# Patient Record
Sex: Female | Born: 1970 | Race: White | Hispanic: No | Marital: Married | Smoking: Never smoker
Health system: Southern US, Community
[De-identification: ages and names within clinical notes are randomized; demographics above are authoritative.]

## PROBLEM LIST (undated history)

## (undated) DIAGNOSIS — K589 Irritable bowel syndrome without diarrhea: Secondary | ICD-10-CM

## (undated) DIAGNOSIS — N809 Endometriosis, unspecified: Secondary | ICD-10-CM

## (undated) DIAGNOSIS — Z8719 Personal history of other diseases of the digestive system: Secondary | ICD-10-CM

## (undated) DIAGNOSIS — M2550 Pain in unspecified joint: Secondary | ICD-10-CM

## (undated) DIAGNOSIS — I10 Essential (primary) hypertension: Secondary | ICD-10-CM

## (undated) DIAGNOSIS — E7800 Pure hypercholesterolemia, unspecified: Secondary | ICD-10-CM

## (undated) DIAGNOSIS — C50919 Malignant neoplasm of unspecified site of unspecified female breast: Secondary | ICD-10-CM

## (undated) DIAGNOSIS — E559 Vitamin D deficiency, unspecified: Secondary | ICD-10-CM

## (undated) DIAGNOSIS — F419 Anxiety disorder, unspecified: Secondary | ICD-10-CM

## (undated) DIAGNOSIS — F32A Depression, unspecified: Secondary | ICD-10-CM

## (undated) DIAGNOSIS — Z8041 Family history of malignant neoplasm of ovary: Secondary | ICD-10-CM

## (undated) DIAGNOSIS — M549 Dorsalgia, unspecified: Secondary | ICD-10-CM

## (undated) DIAGNOSIS — K829 Disease of gallbladder, unspecified: Secondary | ICD-10-CM

## (undated) DIAGNOSIS — K760 Fatty (change of) liver, not elsewhere classified: Secondary | ICD-10-CM

## (undated) DIAGNOSIS — Z923 Personal history of irradiation: Secondary | ICD-10-CM

## (undated) DIAGNOSIS — Z8051 Family history of malignant neoplasm of kidney: Secondary | ICD-10-CM

## (undated) DIAGNOSIS — N979 Female infertility, unspecified: Secondary | ICD-10-CM

## (undated) DIAGNOSIS — R600 Localized edema: Secondary | ICD-10-CM

## (undated) HISTORY — DX: Pure hypercholesterolemia, unspecified: E78.00

## (undated) HISTORY — DX: Family history of malignant neoplasm of kidney: Z80.51

## (undated) HISTORY — DX: Family history of malignant neoplasm of ovary: Z80.41

## (undated) HISTORY — DX: Endometriosis, unspecified: N80.9

## (undated) HISTORY — DX: Essential (primary) hypertension: I10

## (undated) HISTORY — DX: Personal history of other diseases of the digestive system: Z87.19

## (undated) HISTORY — DX: Depression, unspecified: F32.A

## (undated) HISTORY — DX: Vitamin D deficiency, unspecified: E55.9

## (undated) HISTORY — DX: Dorsalgia, unspecified: M54.9

## (undated) HISTORY — DX: Fatty (change of) liver, not elsewhere classified: K76.0

## (undated) HISTORY — DX: Irritable bowel syndrome, unspecified: K58.9

## (undated) HISTORY — DX: Localized edema: R60.0

## (undated) HISTORY — PX: BREAST LUMPECTOMY: SHX2

## (undated) HISTORY — DX: Pain in unspecified joint: M25.50

## (undated) HISTORY — PX: LAPAROSCOPY: SHX197

## (undated) HISTORY — DX: Anxiety disorder, unspecified: F41.9

## (undated) HISTORY — DX: Female infertility, unspecified: N97.9

## (undated) HISTORY — DX: Disease of gallbladder, unspecified: K82.9

---

## 1998-05-18 ENCOUNTER — Other Ambulatory Visit: Admission: RE | Admit: 1998-05-18 | Discharge: 1998-05-18 | Payer: Self-pay | Admitting: Obstetrics & Gynecology

## 1999-07-10 ENCOUNTER — Other Ambulatory Visit: Admission: RE | Admit: 1999-07-10 | Discharge: 1999-07-10 | Payer: Self-pay | Admitting: *Deleted

## 2000-03-31 ENCOUNTER — Encounter (INDEPENDENT_AMBULATORY_CARE_PROVIDER_SITE_OTHER): Payer: Self-pay | Admitting: Specialist

## 2000-03-31 ENCOUNTER — Other Ambulatory Visit: Admission: RE | Admit: 2000-03-31 | Discharge: 2000-03-31 | Payer: Self-pay | Admitting: Obstetrics and Gynecology

## 2000-04-16 ENCOUNTER — Encounter: Payer: Self-pay | Admitting: Obstetrics and Gynecology

## 2000-04-16 ENCOUNTER — Ambulatory Visit (HOSPITAL_COMMUNITY): Admission: RE | Admit: 2000-04-16 | Discharge: 2000-04-16 | Payer: Self-pay | Admitting: Obstetrics and Gynecology

## 2000-10-20 ENCOUNTER — Other Ambulatory Visit: Admission: RE | Admit: 2000-10-20 | Discharge: 2000-10-20 | Payer: Self-pay | Admitting: Obstetrics and Gynecology

## 2003-07-12 ENCOUNTER — Encounter: Payer: Self-pay | Admitting: Emergency Medicine

## 2003-07-12 ENCOUNTER — Emergency Department (HOSPITAL_COMMUNITY): Admission: EM | Admit: 2003-07-12 | Discharge: 2003-07-12 | Payer: Self-pay | Admitting: Emergency Medicine

## 2003-08-14 ENCOUNTER — Other Ambulatory Visit: Admission: RE | Admit: 2003-08-14 | Discharge: 2003-08-14 | Payer: Self-pay | Admitting: Gynecology

## 2004-03-13 ENCOUNTER — Inpatient Hospital Stay (HOSPITAL_COMMUNITY): Admission: AD | Admit: 2004-03-13 | Discharge: 2004-03-16 | Payer: Self-pay | Admitting: Gynecology

## 2004-05-02 ENCOUNTER — Other Ambulatory Visit: Admission: RE | Admit: 2004-05-02 | Discharge: 2004-05-02 | Payer: Self-pay | Admitting: Gynecology

## 2005-07-31 ENCOUNTER — Other Ambulatory Visit: Admission: RE | Admit: 2005-07-31 | Discharge: 2005-07-31 | Payer: Self-pay | Admitting: Gynecology

## 2005-11-14 ENCOUNTER — Other Ambulatory Visit: Admission: RE | Admit: 2005-11-14 | Discharge: 2005-11-14 | Payer: Self-pay | Admitting: Gynecology

## 2006-04-28 ENCOUNTER — Emergency Department (HOSPITAL_COMMUNITY): Admission: EM | Admit: 2006-04-28 | Discharge: 2006-04-28 | Payer: Self-pay | Admitting: Emergency Medicine

## 2006-06-06 ENCOUNTER — Inpatient Hospital Stay (HOSPITAL_COMMUNITY): Admission: AD | Admit: 2006-06-06 | Discharge: 2006-06-08 | Payer: Self-pay | Admitting: Gynecology

## 2006-07-21 ENCOUNTER — Other Ambulatory Visit: Admission: RE | Admit: 2006-07-21 | Discharge: 2006-07-21 | Payer: Self-pay | Admitting: Gynecology

## 2010-05-03 ENCOUNTER — Ambulatory Visit (HOSPITAL_COMMUNITY): Admission: RE | Admit: 2010-05-03 | Discharge: 2010-05-03 | Payer: Self-pay | Admitting: Obstetrics and Gynecology

## 2010-07-26 ENCOUNTER — Inpatient Hospital Stay (HOSPITAL_COMMUNITY): Admission: AD | Admit: 2010-07-26 | Discharge: 2010-07-26 | Payer: Self-pay | Admitting: Obstetrics and Gynecology

## 2010-10-02 ENCOUNTER — Inpatient Hospital Stay (HOSPITAL_COMMUNITY)
Admission: AD | Admit: 2010-10-02 | Discharge: 2010-10-02 | Payer: Self-pay | Source: Home / Self Care | Admitting: Obstetrics and Gynecology

## 2010-11-12 ENCOUNTER — Inpatient Hospital Stay (HOSPITAL_COMMUNITY): Admission: AD | Admit: 2010-11-12 | Discharge: 2010-11-12 | Payer: Self-pay | Admitting: Obstetrics and Gynecology

## 2010-12-03 ENCOUNTER — Inpatient Hospital Stay (HOSPITAL_COMMUNITY)
Admission: AD | Admit: 2010-12-03 | Discharge: 2010-12-04 | Payer: Self-pay | Source: Home / Self Care | Attending: Obstetrics and Gynecology | Admitting: Obstetrics and Gynecology

## 2011-01-20 ENCOUNTER — Encounter: Payer: Self-pay | Admitting: Podiatry

## 2011-03-10 LAB — COMPREHENSIVE METABOLIC PANEL
ALT: 20 U/L (ref 0–35)
AST: 27 U/L (ref 0–37)
Albumin: 2.7 g/dL — ABNORMAL LOW (ref 3.5–5.2)
Alkaline Phosphatase: 143 U/L — ABNORMAL HIGH (ref 39–117)
BUN: 10 mg/dL (ref 6–23)
CO2: 22 mEq/L (ref 19–32)
Calcium: 8.8 mg/dL (ref 8.4–10.5)
Chloride: 105 mEq/L (ref 96–112)
Creatinine, Ser: 0.49 mg/dL (ref 0.4–1.2)
GFR calc Af Amer: >60 mL/min (ref >60)
GFR calc non Af Amer: >60 mL/min (ref >60)
Glucose, Bld: 91 mg/dL (ref 70–99)
Potassium: 3.9 mEq/L (ref 3.5–5.1)
Sodium: 136 mEq/L (ref 135–145)
Total Bilirubin: 0.6 mg/dL (ref 0.3–1.2)
Total Protein: 5.8 g/dL — ABNORMAL LOW (ref 6.0–8.3)

## 2011-03-10 LAB — RH IMMUNE GLOB WKUP(>/=20WKS)(NOT WOMEN'S HOSP)
Fetal Screen: NEGATIVE
Unit division: 0

## 2011-03-10 LAB — CBC
HCT: 41.7 % (ref 36.0–46.0)
Hemoglobin: 12.4 g/dL (ref 12.0–15.0)
Hemoglobin: 14 g/dL (ref 12.0–15.0)
MCH: 31.9 pg (ref 26.0–34.0)
MCHC: 33.6 g/dL (ref 30.0–36.0)
MCV: 95 fL (ref 78.0–100.0)
Platelets: 175 10*3/uL (ref 150–400)
Platelets: 177 10*3/uL (ref 150–400)
RBC: 3.81 MIL/uL — ABNORMAL LOW (ref 3.87–5.11)
RBC: 4.39 MIL/uL (ref 3.87–5.11)
RDW: 13.3 % (ref 11.5–15.5)
WBC: 9.5 10*3/uL (ref 4.0–10.5)

## 2011-03-10 LAB — URINALYSIS, MICROSCOPIC ONLY
Bilirubin Urine: NEGATIVE
Glucose, UA: NEGATIVE mg/dL
Ketones, ur: 15 mg/dL — AB
pH: 6 (ref 5.0–8.0)

## 2011-03-11 LAB — TYPE AND SCREEN: Antibody Screen: POSITIVE

## 2011-03-11 LAB — KLEIHAUER-BETKE STAIN: Quantitation Fetal Hemoglobin: 0 mL

## 2011-03-12 LAB — RH IMMUNE GLOBULIN WORKUP (NOT WOMEN'S HOSP): Antibody Screen: NEGATIVE

## 2011-03-15 LAB — URINE CULTURE

## 2011-03-15 LAB — WET PREP, GENITAL
Trich, Wet Prep: NONE SEEN
Yeast Wet Prep HPF POC: NONE SEEN

## 2011-03-15 LAB — URINALYSIS, ROUTINE W REFLEX MICROSCOPIC
Bilirubin Urine: NEGATIVE
Glucose, UA: NEGATIVE mg/dL
Ketones, ur: NEGATIVE mg/dL
Leukocytes, UA: NEGATIVE
Protein, ur: NEGATIVE mg/dL
pH: 6 (ref 5.0–8.0)

## 2011-03-15 LAB — URINE MICROSCOPIC-ADD ON

## 2011-03-15 LAB — RH IMMUNE GLOBULIN WORKUP (NOT WOMEN'S HOSP)
ABO/RH(D): O NEG
Antibody Screen: NEGATIVE

## 2011-03-18 LAB — RH IMMUNE GLOBULIN WORKUP (NOT WOMEN'S HOSP): ABO/RH(D): O NEG

## 2011-05-16 NOTE — Discharge Summary (Signed)
Caitlin [name], Caitlin [name]                         ACCOUNT NO.:  [id number]   MEDICAL RECORD NO.:  [id number]                   PATIENT TYPE:  INP   LOCATION:  9142                                 FACILITY:  [facility]   PHYSICIAN:  Timothy P. Fontaine, M.D.           DATE OF BIRTH:  1971/09/24   DATE OF ADMISSION:  03/13/2004  DATE OF DISCHARGE:  03/16/2004                                 DISCHARGE SUMMARY   DISCHARGE DIAGNOSES:  1. Intrauterine pregnancy at 41 weeks delivered.  2. Rh negative.  3. Status post spontaneous vaginal delivery.   HISTORY:  This 32-years-of-age female gravida 1 para 0 whose prenatal course  had been complicated by this pregnancy being conceived with IVF.  It was  noted there had been at 19 weeks a prominent right renal pelvis and  prominent right lateral ventricle of the brain.  Sent to [facility] in consult and there was evidence everything except for the kidneys  all other anatomy reported to be normal with bilateral mild renal pelvic  dilatation.  The right measured 6.10 mm, the left measured 4.6 mm.  Follow-  up ultrasound at [facility] revealed the last ultrasound by the  perinatologist documented the right renal pelvis measured 1.3 cm with mild  pyelectasis; the left kidney normal.  Bladder and amniotic fluid were  normal.  No ureters were visualized.  No other abnormalities were  visualized.  There was appropriate growth.  She received RhoGAM in pregnancy  for being Rh negative.   HOSPITAL COURSE:  On March 13, 2004 the patient was admitted at 41 weeks for  induction, was given Cervidil, and Pitocin was begun on March 14, 2004.  The  patient subsequently underwent spontaneous vaginal delivery of a female,  Apgars of 9 and 9, weight of 7 pounds 3 ounces.  There was a second degree  midline episiotomy which was repaired.  There was hymenal laceration which  was repaired.  There were no complications.  Postpartum the patient remained  afebrile, voiding, in stable condition.  She was discharged to home on March 16, 2004 and given [facility] Gynecology postpartum instructions.   ACCESSORY CLINICAL FINDINGS/LABORATORY DATA:  The patient is O negative,  rubella immune.  On March 15, 2004 hemoglobin was 9.3.   DISPOSITION:  The patient was discharged to home, informed to return to the  office in 6 weeks, if had any problem prior to that time to be seen in the  office.  Received RhoGAM prior to discharge and given prescription for Tylox  #20 p.r.n. for pain.     Susa Loffler, P.A.                    Timothy P. Fontaine, M.D.    TSG/MEDQ  D:  04/08/2004  T:  04/08/2004  Job:  [id number]

## 2011-05-16 NOTE — H&P (Signed)
[name], [name]                         ACCOUNT NO.:  [id number]   MEDICAL RECORD NO.:  [id number]                   PATIENT TYPE:  INP   LOCATION:  9161                                 [facility]ILITY:  [facility]   PHYSICIAN:  Juan H. Lily Peer, M.D.             DATE OF BIRTH:  1971/11/09   DATE OF ADMISSION:  03/13/2004  DATE OF DISCHARGE:                                HISTORY & PHYSICAL   CHIEF COMPLAINT:  Postdates, 41 weeks estimated gestational age.   HISTORY:  The patient is a 40 year old gravida 1, para 0, who conceived this  pregnancy via IVF, and who is being admitted for induction due to the fact  she was seen in the office today and she is [redacted] weeks gestation.   Review of her prenatal care had demonstrated during the time of her  screening ultrasound it was noted at [redacted] weeks gestation there was prominent  right renal pelvis and prominent right lateral ventricle of the brain.  She  was sent to [facility] in consultation where she was been by  Caitlin [name].  At that time there was evidence that, except for the  kidneys, all level anatomy was reported to appear normal.  There was  bilateral mild renal pelvic dilatation; the right side measured 6.7 mm and  the left side measured 4.6 mm.  There was report of no caliceal involvement  in the bladder and amniotic fluid volume was normal.  During the study the  pelvises were seen to fluctuate in size and the lateral ventricle of the  brain measured 8 mm, which appeared prominent, but was still within the  normal range and there had been appropriate growth.  This probably  represented mild utero reflux or partial UPJ obstruction, so serial  ultrasound was recommended  and the patient had also been offered genetic  amniocentesis if in the event of a soft marker for trisomy-21, but had  declined.  She was seen for two additional visits  at [facility] with  the last ultrasound evaluation by the perinatologist; and, had  documented  that the right renal pelvis measured 1.3 cm with mild caliectasis; the left  kidney was normal with the pelvis measuring 5 mm.  The bladder and amniotic  fluid volume appeared normal and no ureters were visualized, and no other  fetal abnormalities were identified.  There was excellent appropriate  growth.  Estimated fetal weight at that time was in the 59th percentile and  it was recommended for postnatal evaluation.   The remainder of her pregnancy consists of receiving RhoGAM due to the fact  that she is O negative.  She did receive progesterone suppository for luteal  support during the first trimester after concession via IVF.  The last  ultrasound in the office on March 04th demonstrated the fetus in the 60th  percentile for 39 weeks whereby the baby was weighing 3522 grams, prominent  right renal pelvis, but within normal limits otherwise.  The amniotic fluid  was in the 70th percentile.   The patient was seen in the office today and her cervix was closed, long and  posterior; and, is being admitted for induction.   ALLERGIES:  The patient denies any allergies.   PAST MEDICAL HISTORY:  The patient has PUBS during this pregnancy, which was  treated with Benadryl and Calamine lotion.  She has history of laparoscopy.  She has had irritable bowel syndrome.   REVIEW OF SYSTEMS:  See [employer] form.   PHYSICAL EXAMINATION:  GENERAL APPEARANCE:  Well-developed, well-nourished  female.  VITAL SIGNS:  Blood pressure 130/80.  Urine with trace protein, negative  glucose on dipstick on the office visit on March 16th.  Weight 214 pounds.  HEENT:  Unremarkable.  NECK:  Neck is supple.  Trachea midline.  No carotid bruits.  No  thyromegaly.  LUNGS:  Lungs are clear to auscultation without rhonchi or wheezes.  HEART:  Regular rate and rhythm.  No murmurs.  No gallops.  BREASTS:  Breast exam not done.  ABDOMEN:  Gravid uterus.  Fundal height 39 cm.  Vertex presentation by   Leopold's maneuver.  PELVIC EXAMINATION:  Cervix closed, posterior and long.  EXTREMITIES:  DTRs 1+.  One plus pitting edema.  Negative clonus.   PRENATAL LABORATORY DATA:  Blood type O negative.  Negative antibody screen.  VDRL was nonreactive. Rubella immune.  Hepatitis B surface antigen and HIV  were negative.  ____________ alpha fetoprotein was normal.  Diabetes  screening was normal.  Pap smear was normal.  GBS culture was negative.   ASSESSMENT:  [age]-year-old gravida 1, para 0 at 38 weeks estimated  gestational age is being admitted this evening to [facility] for  cervical ripening with Cervidil followed by Pitocin augmentation the  following morning.  The risks, benefits, and the pros and cons were  discussed with the patient.  Group B Streptococcus culture was negative.   I anticipate vaginal delivery.  Patient is fully aware there is a  possibility that this could require serial inductions.   PLAN:  As per assessment above.                                               Juan H. Lily Peer, M.D.    JHF/MEDQ  D:  03/13/2004  T:  03/14/2004  Job:  [id number]

## 2013-02-16 ENCOUNTER — Ambulatory Visit: Payer: Self-pay | Admitting: Obstetrics and Gynecology

## 2013-03-23 ENCOUNTER — Other Ambulatory Visit: Payer: Self-pay | Admitting: Obstetrics and Gynecology

## 2013-03-24 LAB — PAP IG W/ RFLX HPV ASCU

## 2014-03-28 ENCOUNTER — Other Ambulatory Visit: Payer: Self-pay | Admitting: Obstetrics and Gynecology

## 2014-03-28 DIAGNOSIS — Z1231 Encounter for screening mammogram for malignant neoplasm of breast: Secondary | ICD-10-CM

## 2014-04-13 ENCOUNTER — Ambulatory Visit
Admission: RE | Admit: 2014-04-13 | Discharge: 2014-04-13 | Disposition: A | Discharge status: Home / Self Care | Payer: BC Managed Care – PPO | Source: Ambulatory Visit | Attending: Obstetrics and Gynecology | Admitting: Obstetrics and Gynecology

## 2014-04-13 DIAGNOSIS — Z1231 Encounter for screening mammogram for malignant neoplasm of breast: Secondary | ICD-10-CM

## 2014-05-30 ENCOUNTER — Ambulatory Visit: Admission: RE | Admit: 2014-05-30 | Payer: BC Managed Care – PPO | Source: Ambulatory Visit

## 2014-05-30 ENCOUNTER — Other Ambulatory Visit: Payer: Self-pay | Admitting: Obstetrics and Gynecology

## 2014-05-30 ENCOUNTER — Encounter (INDEPENDENT_AMBULATORY_CARE_PROVIDER_SITE_OTHER): Payer: Self-pay

## 2014-05-30 ENCOUNTER — Ambulatory Visit
Admission: RE | Admit: 2014-05-30 | Discharge: 2014-05-30 | Disposition: A | Discharge status: Home / Self Care | Payer: BC Managed Care – PPO | Source: Ambulatory Visit | Attending: Obstetrics and Gynecology | Admitting: Obstetrics and Gynecology

## 2014-05-30 DIAGNOSIS — Z1231 Encounter for screening mammogram for malignant neoplasm of breast: Secondary | ICD-10-CM

## 2016-05-06 DIAGNOSIS — Z6837 Body mass index (BMI) 37.0-37.9, adult: Secondary | ICD-10-CM | POA: Diagnosis not present

## 2016-05-06 DIAGNOSIS — N390 Urinary tract infection, site not specified: Secondary | ICD-10-CM | POA: Diagnosis not present

## 2016-05-07 DIAGNOSIS — T7840XA Allergy, unspecified, initial encounter: Secondary | ICD-10-CM | POA: Diagnosis not present

## 2016-05-09 DIAGNOSIS — R319 Hematuria, unspecified: Secondary | ICD-10-CM | POA: Diagnosis not present

## 2016-05-09 DIAGNOSIS — N390 Urinary tract infection, site not specified: Secondary | ICD-10-CM | POA: Diagnosis not present

## 2016-05-19 DIAGNOSIS — Z1231 Encounter for screening mammogram for malignant neoplasm of breast: Secondary | ICD-10-CM | POA: Diagnosis not present

## 2016-05-19 DIAGNOSIS — F329 Major depressive disorder, single episode, unspecified: Secondary | ICD-10-CM | POA: Diagnosis not present

## 2016-05-19 DIAGNOSIS — Z6836 Body mass index (BMI) 36.0-36.9, adult: Secondary | ICD-10-CM | POA: Diagnosis not present

## 2016-05-19 DIAGNOSIS — Z01419 Encounter for gynecological examination (general) (routine) without abnormal findings: Secondary | ICD-10-CM | POA: Diagnosis not present

## 2016-05-19 DIAGNOSIS — Z112 Encounter for screening for other bacterial diseases: Secondary | ICD-10-CM | POA: Diagnosis not present

## 2016-05-19 DIAGNOSIS — R102 Pelvic and perineal pain: Secondary | ICD-10-CM | POA: Diagnosis not present

## 2016-06-18 DIAGNOSIS — R3121 Asymptomatic microscopic hematuria: Secondary | ICD-10-CM | POA: Diagnosis not present

## 2016-06-18 DIAGNOSIS — R102 Pelvic and perineal pain: Secondary | ICD-10-CM | POA: Diagnosis not present

## 2016-08-06 ENCOUNTER — Encounter: Payer: Self-pay | Admitting: Pediatrics

## 2016-08-07 DIAGNOSIS — R635 Abnormal weight gain: Secondary | ICD-10-CM | POA: Diagnosis not present

## 2016-08-07 DIAGNOSIS — Z0000 Encounter for general adult medical examination without abnormal findings: Secondary | ICD-10-CM | POA: Diagnosis not present

## 2016-08-07 DIAGNOSIS — F419 Anxiety disorder, unspecified: Secondary | ICD-10-CM | POA: Diagnosis not present

## 2016-08-18 DIAGNOSIS — R3121 Asymptomatic microscopic hematuria: Secondary | ICD-10-CM | POA: Diagnosis not present

## 2016-09-11 DIAGNOSIS — H04123 Dry eye syndrome of bilateral lacrimal glands: Secondary | ICD-10-CM | POA: Diagnosis not present

## 2016-09-11 DIAGNOSIS — R51 Headache: Secondary | ICD-10-CM | POA: Diagnosis not present

## 2016-10-14 DIAGNOSIS — R3121 Asymptomatic microscopic hematuria: Secondary | ICD-10-CM | POA: Diagnosis not present

## 2016-12-29 HISTORY — PX: CHOLECYSTECTOMY: SHX55

## 2017-01-29 DIAGNOSIS — D2262 Melanocytic nevi of left upper limb, including shoulder: Secondary | ICD-10-CM | POA: Diagnosis not present

## 2017-01-29 DIAGNOSIS — D2261 Melanocytic nevi of right upper limb, including shoulder: Secondary | ICD-10-CM | POA: Diagnosis not present

## 2017-01-29 DIAGNOSIS — D2212 Melanocytic nevi of left eyelid, including canthus: Secondary | ICD-10-CM | POA: Diagnosis not present

## 2017-01-29 DIAGNOSIS — D220 Melanocytic nevi of lip: Secondary | ICD-10-CM | POA: Diagnosis not present

## 2017-02-10 DIAGNOSIS — Z23 Encounter for immunization: Secondary | ICD-10-CM | POA: Diagnosis not present

## 2017-04-05 DIAGNOSIS — R1011 Right upper quadrant pain: Secondary | ICD-10-CM | POA: Diagnosis not present

## 2017-04-06 ENCOUNTER — Other Ambulatory Visit: Payer: Self-pay | Admitting: Family Medicine

## 2017-04-06 DIAGNOSIS — R1011 Right upper quadrant pain: Secondary | ICD-10-CM

## 2017-04-13 ENCOUNTER — Ambulatory Visit
Admission: RE | Admit: 2017-04-13 | Discharge: 2017-04-13 | Disposition: A | Discharge status: Home / Self Care | Payer: BLUE CROSS/BLUE SHIELD | Source: Ambulatory Visit | Attending: Family Medicine | Admitting: Family Medicine

## 2017-04-13 DIAGNOSIS — K8020 Calculus of gallbladder without cholecystitis without obstruction: Secondary | ICD-10-CM | POA: Diagnosis not present

## 2017-04-13 DIAGNOSIS — R1011 Right upper quadrant pain: Secondary | ICD-10-CM

## 2017-04-29 DIAGNOSIS — K8010 Calculus of gallbladder with chronic cholecystitis without obstruction: Secondary | ICD-10-CM | POA: Diagnosis not present

## 2017-05-21 DIAGNOSIS — Z1231 Encounter for screening mammogram for malignant neoplasm of breast: Secondary | ICD-10-CM | POA: Diagnosis not present

## 2017-05-21 DIAGNOSIS — Z6836 Body mass index (BMI) 36.0-36.9, adult: Secondary | ICD-10-CM | POA: Diagnosis not present

## 2017-05-21 DIAGNOSIS — Z01419 Encounter for gynecological examination (general) (routine) without abnormal findings: Secondary | ICD-10-CM | POA: Diagnosis not present

## 2017-05-21 DIAGNOSIS — Z124 Encounter for screening for malignant neoplasm of cervix: Secondary | ICD-10-CM | POA: Diagnosis not present

## 2017-06-01 DIAGNOSIS — K8010 Calculus of gallbladder with chronic cholecystitis without obstruction: Secondary | ICD-10-CM | POA: Diagnosis not present

## 2017-06-02 DIAGNOSIS — K824 Cholesterolosis of gallbladder: Secondary | ICD-10-CM | POA: Diagnosis not present

## 2017-06-02 DIAGNOSIS — K8010 Calculus of gallbladder with chronic cholecystitis without obstruction: Secondary | ICD-10-CM | POA: Diagnosis not present

## 2017-09-25 DIAGNOSIS — L570 Actinic keratosis: Secondary | ICD-10-CM | POA: Diagnosis not present

## 2017-10-09 DIAGNOSIS — D1803 Hemangioma of intra-abdominal structures: Secondary | ICD-10-CM | POA: Diagnosis not present

## 2017-10-09 DIAGNOSIS — Z23 Encounter for immunization: Secondary | ICD-10-CM | POA: Diagnosis not present

## 2017-10-09 DIAGNOSIS — Z0000 Encounter for general adult medical examination without abnormal findings: Secondary | ICD-10-CM | POA: Diagnosis not present

## 2017-10-09 DIAGNOSIS — Z131 Encounter for screening for diabetes mellitus: Secondary | ICD-10-CM | POA: Diagnosis not present

## 2017-10-09 DIAGNOSIS — Z13220 Encounter for screening for lipoid disorders: Secondary | ICD-10-CM | POA: Diagnosis not present

## 2017-10-13 ENCOUNTER — Other Ambulatory Visit: Payer: Self-pay | Admitting: Family Medicine

## 2017-10-13 DIAGNOSIS — D1803 Hemangioma of intra-abdominal structures: Secondary | ICD-10-CM

## 2017-10-26 ENCOUNTER — Ambulatory Visit
Admission: RE | Admit: 2017-10-26 | Discharge: 2017-10-26 | Disposition: A | Discharge status: Home / Self Care | Payer: BLUE CROSS/BLUE SHIELD | Source: Ambulatory Visit | Attending: Family Medicine | Admitting: Family Medicine

## 2017-10-26 DIAGNOSIS — K7689 Other specified diseases of liver: Secondary | ICD-10-CM | POA: Diagnosis not present

## 2017-10-26 DIAGNOSIS — D1803 Hemangioma of intra-abdominal structures: Secondary | ICD-10-CM

## 2017-10-26 MED ORDER — GADOBENATE DIMEGLUMINE 529 MG/ML IV SOLN
17.0000 mL | Freq: Once | INTRAVENOUS | Status: AC | PRN
  Administered 2017-10-26: 17 mL via INTRAVENOUS

## 2018-01-14 DIAGNOSIS — M21622 Bunionette of left foot: Secondary | ICD-10-CM | POA: Diagnosis not present

## 2018-01-14 DIAGNOSIS — M79671 Pain in right foot: Secondary | ICD-10-CM | POA: Diagnosis not present

## 2018-01-14 DIAGNOSIS — B079 Viral wart, unspecified: Secondary | ICD-10-CM | POA: Diagnosis not present

## 2018-01-14 DIAGNOSIS — M79672 Pain in left foot: Secondary | ICD-10-CM | POA: Diagnosis not present

## 2018-01-14 DIAGNOSIS — M21621 Bunionette of right foot: Secondary | ICD-10-CM | POA: Diagnosis not present

## 2018-01-28 DIAGNOSIS — B079 Viral wart, unspecified: Secondary | ICD-10-CM | POA: Diagnosis not present

## 2018-01-28 DIAGNOSIS — M79671 Pain in right foot: Secondary | ICD-10-CM | POA: Diagnosis not present

## 2018-01-28 DIAGNOSIS — M79672 Pain in left foot: Secondary | ICD-10-CM | POA: Diagnosis not present

## 2018-02-10 DIAGNOSIS — M79672 Pain in left foot: Secondary | ICD-10-CM | POA: Diagnosis not present

## 2018-02-10 DIAGNOSIS — M79671 Pain in right foot: Secondary | ICD-10-CM | POA: Diagnosis not present

## 2018-02-10 DIAGNOSIS — B079 Viral wart, unspecified: Secondary | ICD-10-CM | POA: Diagnosis not present

## 2018-03-04 DIAGNOSIS — M79671 Pain in right foot: Secondary | ICD-10-CM | POA: Diagnosis not present

## 2018-03-04 DIAGNOSIS — B079 Viral wart, unspecified: Secondary | ICD-10-CM | POA: Diagnosis not present

## 2018-03-04 DIAGNOSIS — M79672 Pain in left foot: Secondary | ICD-10-CM | POA: Diagnosis not present

## 2018-03-12 DIAGNOSIS — R1010 Upper abdominal pain, unspecified: Secondary | ICD-10-CM | POA: Diagnosis not present

## 2018-03-17 DIAGNOSIS — D2239 Melanocytic nevi of other parts of face: Secondary | ICD-10-CM | POA: Diagnosis not present

## 2018-03-17 DIAGNOSIS — D2271 Melanocytic nevi of right lower limb, including hip: Secondary | ICD-10-CM | POA: Diagnosis not present

## 2018-03-17 DIAGNOSIS — D2262 Melanocytic nevi of left upper limb, including shoulder: Secondary | ICD-10-CM | POA: Diagnosis not present

## 2018-03-17 DIAGNOSIS — D2261 Melanocytic nevi of right upper limb, including shoulder: Secondary | ICD-10-CM | POA: Diagnosis not present

## 2018-03-17 DIAGNOSIS — D225 Melanocytic nevi of trunk: Secondary | ICD-10-CM | POA: Diagnosis not present

## 2018-09-08 DIAGNOSIS — Z01419 Encounter for gynecological examination (general) (routine) without abnormal findings: Secondary | ICD-10-CM | POA: Diagnosis not present

## 2018-09-08 DIAGNOSIS — Z3040 Encounter for surveillance of contraceptives, unspecified: Secondary | ICD-10-CM | POA: Diagnosis not present

## 2018-09-08 DIAGNOSIS — Z6836 Body mass index (BMI) 36.0-36.9, adult: Secondary | ICD-10-CM | POA: Diagnosis not present

## 2018-09-08 DIAGNOSIS — Z1231 Encounter for screening mammogram for malignant neoplasm of breast: Secondary | ICD-10-CM | POA: Diagnosis not present

## 2018-09-25 DIAGNOSIS — Z23 Encounter for immunization: Secondary | ICD-10-CM | POA: Diagnosis not present

## 2018-10-21 DIAGNOSIS — H04123 Dry eye syndrome of bilateral lacrimal glands: Secondary | ICD-10-CM | POA: Diagnosis not present

## 2018-10-21 DIAGNOSIS — H1045 Other chronic allergic conjunctivitis: Secondary | ICD-10-CM | POA: Diagnosis not present

## 2018-10-26 DIAGNOSIS — Z131 Encounter for screening for diabetes mellitus: Secondary | ICD-10-CM | POA: Diagnosis not present

## 2018-10-26 DIAGNOSIS — Z13220 Encounter for screening for lipoid disorders: Secondary | ICD-10-CM | POA: Diagnosis not present

## 2018-10-26 DIAGNOSIS — Z0000 Encounter for general adult medical examination without abnormal findings: Secondary | ICD-10-CM | POA: Diagnosis not present

## 2018-10-26 DIAGNOSIS — K148 Other diseases of tongue: Secondary | ICD-10-CM | POA: Diagnosis not present

## 2018-11-09 DIAGNOSIS — K141 Geographic tongue: Secondary | ICD-10-CM | POA: Diagnosis not present

## 2018-11-09 DIAGNOSIS — F411 Generalized anxiety disorder: Secondary | ICD-10-CM | POA: Diagnosis not present

## 2018-11-09 DIAGNOSIS — E049 Nontoxic goiter, unspecified: Secondary | ICD-10-CM | POA: Diagnosis not present

## 2018-11-09 DIAGNOSIS — R1011 Right upper quadrant pain: Secondary | ICD-10-CM | POA: Diagnosis not present

## 2018-11-09 DIAGNOSIS — F39 Unspecified mood [affective] disorder: Secondary | ICD-10-CM | POA: Diagnosis not present

## 2018-11-09 DIAGNOSIS — F4320 Adjustment disorder, unspecified: Secondary | ICD-10-CM | POA: Diagnosis not present

## 2018-11-23 DIAGNOSIS — E049 Nontoxic goiter, unspecified: Secondary | ICD-10-CM | POA: Diagnosis not present

## 2018-11-23 DIAGNOSIS — F39 Unspecified mood [affective] disorder: Secondary | ICD-10-CM | POA: Diagnosis not present

## 2018-11-23 DIAGNOSIS — F411 Generalized anxiety disorder: Secondary | ICD-10-CM | POA: Diagnosis not present

## 2018-11-23 DIAGNOSIS — K141 Geographic tongue: Secondary | ICD-10-CM | POA: Diagnosis not present

## 2020-05-14 ENCOUNTER — Encounter: Payer: Self-pay | Admitting: Neurology

## 2020-07-31 NOTE — Progress Notes (Signed)
NEUROLOGY CONSULTATION NOTE  Caitlin [name] MRN: [id number] DOB: December 03, 1971  Referring provider: Horald Pollen, MD Primary care provider: Lorene Dy, MD  Reason for consult:  Migraines, pulsatile tinnitus  HISTORY OF PRESENT ILLNESS: Caitlin [name] is a 49 year old right-handed female who presents for migraines and pulsatile tinnitus.  History supplemented by referring provider's note.  Visual aura wavy pattern and then blind spont.    She has longstanding history of migraine presenting with visual aura of wavy lines followed by blind spot preceding headache.  Several years ago, she was started on Mirena and developed bilateral pulsatile tinnitus.  She began having new headaches in the Fall of 2020.  They are severe right occipital shooting pain associated with nausea, photophobia, and phonophobia but not associated with speech disturbance, dizziness, numbness or weakness.  She treats with ibuprofen.  In April 2021, she had a cough that triggered a particularly severe occipital headache.  It was aggravated by change in position, either standing up or bending over.  The severe headache subsided but she had a persistent dull right occipital pressure that lasted 2 weeks.  Other than her typical headaches, she has not had a recurrence of that occipital headache since then.  CT of head on 04/26/2020 was unremarkable.  She had an MRI of brain and internal auditory canals on 06/05/2020 which was normal.  She saw her ophthalmologist, Dr. 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  Visual fields were full.  Most recent exam on 07/05/2020 was stable.  She denies visual obscurations.  TSH from 05/12/2020 was 0.60.  05/12/2020 LABS:  CMP with Na 137, K 4.8, Cl 101, CO2 24, glucose 103, BUN 25, Cr 0.82, Ca 9.6, 6 bili 0.3, ALP 77, AST 22, ALT 31   PAST MEDICAL HISTORY: Past Medical History:  Diagnosis Date  . Elevated cholesterol   . Endometriosis   . History of IBS   .  Infertility, female     PAST SURGICAL HISTORY: Past Surgical History:  Procedure Laterality Date  . LAPAROSCOPY      MEDICATIONS: No current outpatient medications on file prior to visit.   No current facility-administered medications on file prior to visit.    ALLERGIES: No Known Allergies  FAMILY HISTORY: Family History  Problem Relation Age of Onset  . Hypertension Father   . Diabetes Maternal Aunt   . Cancer Paternal Grandmother        ovarian    SOCIAL HISTORY: Social History   Socioeconomic History  . Marital status: Married    Spouse name: Not on file  . Number of children: Not on file  . Years of education: Not on file  . Highest education level: Not on file  Occupational History  . Not on file  Tobacco Use  . Smoking status: Never Smoker  . Smokeless tobacco: Never Used  Substance and Sexual Activity  . Alcohol use: No  . Drug use: No  . Sexual activity: Yes  Other Topics Concern  . Not on file  Social History Narrative  . Not on file   Social Determinants of Health   Financial Resource Strain:   . Difficulty of Paying Living Expenses:   Food Insecurity:   . Worried About Charity fundraiser in the Last Year:   . Arboriculturist in the Last Year:   Transportation Needs:   . Film/video editor (Medical):   Marland Kitchen Lack of Transportation (Non-Medical):   Physical Activity:   .  Days of Exercise per Week:   . Minutes of Exercise per Session:   Stress:   . Feeling of Stress :   Social Connections:   . Frequency of Communication with Friends and Family:   . Frequency of Social Gatherings with Friends and Family:   . Attends Religious Services:   . Active Member of Clubs or Organizations:   . Attends Archivist Meetings:   Marland Kitchen Marital Status:   Intimate Partner Violence:   . Fear of Current or Ex-Partner:   . Emotionally Abused:   Marland Kitchen Physically Abused:   . Sexually Abused:     PHYSICAL EXAM: Blood pressure 128/80, pulse 79, height  5\' 5"  (1.651 m), weight 199 lb (90.3 kg), SpO2 99 %. General: No acute distress.  Patient appears well-groomed.  Head:  Normocephalic/atraumatic Eyes:  fundi examined but not visualized Neck: supple, no paraspinal tenderness, full range of motion Back: No paraspinal tenderness Heart: regular rate and rhythm Lungs: Clear to auscultation bilaterally. Vascular: No carotid bruits. Neurological Exam: Mental status: alert and oriented to person, place, and time, recent and remote memory intact, fund of knowledge intact, attention and concentration intact, speech fluent and not dysarthric, language intact. Cranial nerves: CN I: not tested CN II: pupils equal, round and reactive to light, visual fields intact CN III, IV, VI:  full range of motion, no nystagmus, no ptosis CN V: facial sensation intact CN VII: upper and lower face symmetric CN VIII: hearing intact CN IX, X: gag intact, uvula midline CN XI: sternocleidomastoid and trapezius muscles intact CN XII: tongue midline Bulk & Tone: normal, no fasciculations. Motor:  5/5 throughout  Sensation:  Pinprick and vibration sensation intact. Deep Tendon Reflexes:  2+ throughout, toes downgoing.  Finger to nose testing:  Without dysmetria.  Heel to shin:  Without dysmetria.  Gait:  Normal station and stride.  Able to turn and tandem walk. Romberg negative.  IMPRESSION: 1.  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  Bilateral pulsatile tinnitus.   Concern for idiopathic intracranial hypertension.  She would like to avoid starting a carbonic anhydrase inhibitor such as acetazolamide if possible.  As she is asymptomatic (other than for pulsatile tinnitus which doesn't bother her), and eye exam shows full visual fields and only trace possible papilledema, I think this is reasonable.   PLAN: 1.  MRV of head to evaluate for venus sinus thrombosis. 2.  MRA of head and neck to evaluate for alternative causes of pulsatile tinnitus. 3.  Schedule for lumbar  puncture to record opening CSF pressure and assess CSF cell count, culture, protein and glucose. 4.  She will follow up with Dr. Katy Fitch as scheduled. 5.  She will follow up with me in 6 months.  Thank you for allowing me to take part in the care of this patient.  Metta Clines, DO  CC:  Horald Pollen, MD

## 2020-08-01 ENCOUNTER — Ambulatory Visit (INDEPENDENT_AMBULATORY_CARE_PROVIDER_SITE_OTHER): Payer: BC Managed Care – PPO | Admitting: Neurology

## 2020-08-01 ENCOUNTER — Other Ambulatory Visit: Payer: Self-pay

## 2020-08-01 ENCOUNTER — Encounter: Payer: Self-pay | Admitting: Neurology

## 2020-08-01 VITALS — BP 128/80 | HR 79 | Ht 65.0 in | Wt 199.0 lb

## 2020-08-01 DIAGNOSIS — H4710 Unspecified papilledema: Secondary | ICD-10-CM

## 2020-08-01 DIAGNOSIS — H93A3 Pulsatile tinnitus, bilateral: Secondary | ICD-10-CM

## 2020-08-01 DIAGNOSIS — G4489 Other headache syndrome: Secondary | ICD-10-CM

## 2020-08-01 NOTE — Patient Instructions (Addendum)
1.  Check MRV of head and MRA of head and neck. We have sent a referral to [facility] for your MRA and MRV and they will call you directly to schedule your appointment. They are located at [place]. If you need to contact them directly please call [phone].  2.  Schedule for lumbar puncture (spinal tap) to check CSF opening pressure and check CSF cell count, culture, protein and glucose. 3.  Further recommendations pending results 4.  Follow up with Dr. Katy Fitch as scheduled 5.  Follow up with me in 6 months.

## 2020-08-29 ENCOUNTER — Ambulatory Visit
Admission: RE | Admit: 2020-08-29 | Discharge: 2020-08-29 | Disposition: A | Discharge status: Home / Self Care | Payer: BC Managed Care – PPO | Source: Ambulatory Visit | Attending: Neurology | Admitting: Neurology

## 2020-08-29 DIAGNOSIS — H4710 Unspecified papilledema: Secondary | ICD-10-CM

## 2020-08-29 DIAGNOSIS — H93A3 Pulsatile tinnitus, bilateral: Secondary | ICD-10-CM

## 2020-08-29 MED ORDER — GADOBENATE DIMEGLUMINE 529 MG/ML IV SOLN
18.0000 mL | Freq: Once | INTRAVENOUS | Status: AC | PRN
  Administered 2020-08-29: 18 mL via INTRAVENOUS

## 2020-08-31 ENCOUNTER — Other Ambulatory Visit: Payer: Self-pay

## 2020-08-31 ENCOUNTER — Ambulatory Visit
Admission: RE | Admit: 2020-08-31 | Discharge: 2020-08-31 | Disposition: A | Discharge status: Home / Self Care | Payer: BC Managed Care – PPO | Source: Ambulatory Visit | Attending: Neurology | Admitting: Neurology

## 2020-08-31 DIAGNOSIS — H93A3 Pulsatile tinnitus, bilateral: Secondary | ICD-10-CM

## 2020-08-31 DIAGNOSIS — H4710 Unspecified papilledema: Secondary | ICD-10-CM

## 2020-08-31 DIAGNOSIS — G4489 Other headache syndrome: Secondary | ICD-10-CM

## 2020-09-04 ENCOUNTER — Telehealth: Payer: Self-pay

## 2020-09-04 NOTE — Telephone Encounter (Signed)
PT advised of her MRI results

## 2020-09-04 NOTE — Telephone Encounter (Signed)
-----   Message from Pieter Partridge, DO sent at 09/04/2020  7:19 AM EDT ----- Reviewed all the MRIs of the blood vessels in the head and neck.  Nothing concerning such as aneurysm or blood clot. Proceed with lumbar puncture.

## 2020-09-06 ENCOUNTER — Other Ambulatory Visit: Payer: Self-pay

## 2020-09-06 ENCOUNTER — Ambulatory Visit
Admission: RE | Admit: 2020-09-06 | Discharge: 2020-09-06 | Disposition: A | Discharge status: Home / Self Care | Payer: BC Managed Care – PPO | Source: Ambulatory Visit | Attending: Neurology | Admitting: Neurology

## 2020-09-06 VITALS — BP 128/74 | HR 86

## 2020-09-06 DIAGNOSIS — H4710 Unspecified papilledema: Secondary | ICD-10-CM

## 2020-09-06 DIAGNOSIS — H93A3 Pulsatile tinnitus, bilateral: Secondary | ICD-10-CM

## 2020-09-06 NOTE — Progress Notes (Signed)
Blood obtained from [facility] for LP labs by [employer] (phlebotomist). Pt tolerated procedure well. Site is unremarkable.

## 2020-09-06 NOTE — Discharge Instructions (Signed)

## 2020-09-10 ENCOUNTER — Other Ambulatory Visit: Payer: Self-pay

## 2020-09-10 DIAGNOSIS — G932 Benign intracranial hypertension: Secondary | ICD-10-CM | POA: Insufficient documentation

## 2020-09-10 LAB — CNS IGG SYNTHESIS RATE, CSF+BLOOD
Albumin Serum: 4 g/dL (ref 3.5–5.2)
Albumin, CSF: 16.4 mg/dL (ref 8.0–42.0)
CNS-IgG Synthesis Rate: -5.6 mg/24 h (ref <=3.3)
IgG (Immunoglobin G), Serum: 1350 mg/dL (ref 600–1640)
IgG Total CSF: 2.4 mg/dL (ref 0.8–7.7)
IgG-Index: 0.43 (ref <0.66)

## 2020-09-10 LAB — MYELIN BASIC PROTEIN, CSF: Myelin Basic Protein: <2 mcg/L (ref 2.0–4.0)

## 2020-09-10 LAB — CSF CULTURE W GRAM STAIN
MICRO NUMBER:: 10929410
Result:: NO GROWTH
SPECIMEN QUALITY:: ADEQUATE

## 2020-09-10 LAB — CSF CELL COUNT WITH DIFFERENTIAL
RBC Count, CSF: 3 cells/uL — ABNORMAL HIGH
WBC, CSF: 3 cells/uL (ref 0–5)

## 2020-09-10 LAB — PROTEIN, CSF: Total Protein, CSF: 28 mg/dL (ref 15–45)

## 2020-09-10 LAB — GLUCOSE, CSF: Glucose, CSF: 56 mg/dL (ref 40–80)

## 2020-09-10 MED ORDER — TOPIRAMATE 25 MG PO TABS: 25.0000 mg | ORAL_TABLET | Freq: Every day | ORAL | 1 refills | Status: DC

## 2020-11-19 ENCOUNTER — Other Ambulatory Visit: Payer: Self-pay | Admitting: Neurology

## 2021-01-31 NOTE — Progress Notes (Signed)
NEUROLOGY FOLLOW UP OFFICE NOTE  Caitlin [name] [id number]   Subjective:  Caitlin [name] is a 50 year old right-handed female who follows up for pulsatile tinnitus and migraines.  UPDATE: She underwent workup for pulsatile tinnitus and idiopathic intracranial hypertension. MRA of head and neck on 08/29/2020 personally reviewed was normal. MRV of head on 08/31/2020 personally reviewed showed narrowed distal transverse sinus bilaterally and partially empty sella but no thrombosis. She underwent LP on 09/06/2020 which demonstrated opening pressure of 23 cm water and closing pressure of 17 cm water.   She was subsequently started on topiramate.  Current NSAIDs/analgesics:  ibuprofen Current Anticonvulsant medications:  topiramate 50mg   She has only had one dull headache since starting the topiramate, when it was snowing, and lasted a couple of hours and didn't require any medication.  Pulsating sensation has decreased significantly.  She may hear it once in a while.  If she stands up quickly or bends over, she may get the head pressure briefly.  She saw Dr. Katy Fitch again and showed minimal improvement in optic nerve but overall stable   HISTORY: She has longstanding history of migraine presenting with visual aura of wavy lines followed by blind spot preceding headache.  Several years ago, she was started on Mirena and developed bilateral pulsatile tinnitus.  She began having new headaches in the Fall of 2020.  They are severe right occipital shooting pain associated with nausea, photophobia, and phonophobia but not associated with speech disturbance, dizziness, numbness or weakness.  She treats with ibuprofen.  In April 2021, she had a cough that triggered a particularly severe occipital headache.  It was aggravated by change in position, either standing up or bending over.  The severe headache subsided but she had a persistent dull right occipital pressure that lasted 2 weeks.  Other than her  typical headaches, she has not had a recurrence of that occipital headache since then.  CT of head on 04/26/2020 was unremarkable.  She had an MRI of brain and internal auditory canals on 06/05/2020 which was normal.  She saw her ophthalmologist, Dr. 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  Visual fields were full.  Most recent exam on 07/05/2020 was stable.  She denies visual obscurations.  TSH from 05/12/2020 was 0.60.  PAST MEDICAL HISTORY: Past Medical History:  Diagnosis Date  . Elevated cholesterol   . Endometriosis   . Endometriosis   . History of IBS   . Infertility, female     MEDICATIONS: Current Outpatient Medications on File Prior to Visit  Medication Sig Dispense Refill  . cholecalciferol (VITAMIN D3) 25 MCG (1000 UNIT) tablet Take 1,000 Units by mouth daily. Take Once a Day    . topiramate (TOPAMAX) 50 MG tablet Take 1 tablet (50 mg total) by mouth at bedtime. 30 tablet 3   No current facility-administered medications on file prior to visit.    ALLERGIES: Allergies  Allergen Reactions  . Ciprofloxacin Other (See Comments)    "achy joints" "achy joints"   . Other Rash    SULFA DRUGS    FAMILY HISTORY: Family History  Problem Relation Age of Onset  . Hypertension Father   . Diabetes Maternal Aunt   . Cancer Paternal Grandmother        ovarian    SOCIAL HISTORY: Social History   Socioeconomic History  . Marital status: Married    Spouse name: Not on file  . Number of children: 3  . Years  of education: Not on file  . Highest education level: Not on file  Occupational History  . Not on file  Tobacco Use  . Smoking status: Never Smoker  . Smokeless tobacco: Never Used  Vaping Use  . Vaping Use: Never used  Substance and Sexual Activity  . Alcohol use: No  . Drug use: No  . Sexual activity: Yes  Other Topics Concern  . Not on file  Social History Narrative   Right Handed   Two Story Home   Drinks Caffeine Occasionally     Social Determinants of Health   Financial Resource Strain: Not on file  Food Insecurity: Not on file  Transportation Needs: Not on file  Physical Activity: Not on file  Stress: Not on file  Social Connections: Not on file  Intimate Partner Violence: Not on file     Objective:  Blood pressure 129/82, pulse 78, height 5\' 5"  (1.651 m), weight 212 lb (96.2 kg), SpO2 99 %. General: No acute distress.  Patient appears well-groomed.   Head:  Normocephalic/atraumatic Eyes:  Fundi examined but not visualized Neck: supple, no paraspinal tenderness, full range of motion Heart:  Regular rate and rhythm Lungs:  Clear to auscultation bilaterally Back: No paraspinal tenderness Neurological Exam: alert and oriented to person, place, and time. Attention span and concentration intact, recent and remote memory intact, fund of knowledge intact.  Speech fluent and not dysarthric, language intact.  CN II-XII intact. Bulk and tone normal, muscle strength 5/5 throughout.  Sensation to light touch, temperature and vibration intact.  Deep tendon reflexes 2+ throughout, toes downgoing.  Finger to nose and heel to shin testing intact.  Gait normal, Romberg negative.   Assessment/Plan:   1.  History of migraine with aura presenting with occipital headaches, pulsatile tinnitus and questionable papilledema.  Opening pressure on LP only borderline elevated at 23 cm water but given associated symptoms and MRI findings, consider idiopathic intracranial hypertension.  1.  Topiramate 50mg  at bedtime - refilled today 2.  Follow up with Dr. Katy Fitch for routine eye exam 3.  Limit use of pain relievers to no more than 2 days out of week to prevent risk of rebound or medication-overuse headache. 4.  Keep headache diary 5.  Follow up in 6 months.  Metta Clines, DO  CC:  Horald Pollen, MD

## 2021-02-01 ENCOUNTER — Other Ambulatory Visit: Payer: Self-pay

## 2021-02-01 ENCOUNTER — Ambulatory Visit (INDEPENDENT_AMBULATORY_CARE_PROVIDER_SITE_OTHER): Payer: BC Managed Care – PPO | Admitting: Neurology

## 2021-02-01 ENCOUNTER — Encounter: Payer: Self-pay | Admitting: Neurology

## 2021-02-01 VITALS — BP 129/82 | HR 78 | Ht 65.0 in | Wt 212.0 lb

## 2021-02-01 DIAGNOSIS — G4489 Other headache syndrome: Secondary | ICD-10-CM | POA: Diagnosis not present

## 2021-02-01 DIAGNOSIS — G932 Benign intracranial hypertension: Secondary | ICD-10-CM | POA: Diagnosis not present

## 2021-02-01 MED ORDER — TOPIRAMATE 50 MG PO TABS
50.0000 mg | ORAL_TABLET | Freq: Every day | ORAL | 5 refills | Status: DC
Start: 2021-02-01 — End: 2021-03-14

## 2021-02-01 NOTE — Patient Instructions (Signed)
Refilled topiramate 50mg  at bedtime Limit use of pain relievers to no more than 2 days out of week to prevent risk of rebound or medication-overuse headache. Keep headache diary Follow up with Dr. Katy Fitch for routine recheck Follow up with me in 6 months

## 2021-02-04 ENCOUNTER — Encounter (INDEPENDENT_AMBULATORY_CARE_PROVIDER_SITE_OTHER): Payer: Self-pay | Admitting: Bariatrics

## 2021-02-04 ENCOUNTER — Other Ambulatory Visit: Payer: Self-pay

## 2021-02-04 ENCOUNTER — Ambulatory Visit (INDEPENDENT_AMBULATORY_CARE_PROVIDER_SITE_OTHER): Payer: BC Managed Care – PPO | Admitting: Bariatrics

## 2021-02-04 VITALS — BP 128/85 | HR 100 | Temp 98.4°F | Ht 65.0 in | Wt 208.0 lb

## 2021-02-04 DIAGNOSIS — E7800 Pure hypercholesterolemia, unspecified: Secondary | ICD-10-CM

## 2021-02-04 DIAGNOSIS — Z0289 Encounter for other administrative examinations: Secondary | ICD-10-CM

## 2021-02-04 DIAGNOSIS — R5383 Other fatigue: Secondary | ICD-10-CM

## 2021-02-04 DIAGNOSIS — G932 Benign intracranial hypertension: Secondary | ICD-10-CM | POA: Diagnosis not present

## 2021-02-04 DIAGNOSIS — R0602 Shortness of breath: Secondary | ICD-10-CM | POA: Diagnosis not present

## 2021-02-04 DIAGNOSIS — E669 Obesity, unspecified: Secondary | ICD-10-CM

## 2021-02-04 DIAGNOSIS — E559 Vitamin D deficiency, unspecified: Secondary | ICD-10-CM | POA: Diagnosis not present

## 2021-02-04 DIAGNOSIS — Z1331 Encounter for screening for depression: Secondary | ICD-10-CM

## 2021-02-04 DIAGNOSIS — R7309 Other abnormal glucose: Secondary | ICD-10-CM

## 2021-02-04 DIAGNOSIS — F5089 Other specified eating disorder: Secondary | ICD-10-CM

## 2021-02-04 DIAGNOSIS — Z9189 Other specified personal risk factors, not elsewhere classified: Secondary | ICD-10-CM

## 2021-02-04 DIAGNOSIS — Z6834 Body mass index (BMI) 34.0-34.9, adult: Secondary | ICD-10-CM

## 2021-02-04 DIAGNOSIS — E6609 Other obesity due to excess calories: Secondary | ICD-10-CM

## 2021-02-05 LAB — HEMOGLOBIN A1C
Est. average glucose Bld gHb Est-mCnc: 111 mg/dL
Hgb A1c MFr Bld: 5.5 % (ref 4.8–5.6)

## 2021-02-05 LAB — LIPID PANEL WITH LDL/HDL RATIO
Cholesterol, Total: 232 mg/dL — ABNORMAL HIGH (ref 100–199)
HDL: 51 mg/dL (ref >39)
LDL Chol Calc (NIH): 162 mg/dL — ABNORMAL HIGH (ref 0–99)
LDL/HDL Ratio: 3.2 ratio (ref 0.0–3.2)
Triglycerides: 105 mg/dL (ref 0–149)
VLDL Cholesterol Cal: 19 mg/dL (ref 5–40)

## 2021-02-05 LAB — VITAMIN D 25 HYDROXY (VIT D DEFICIENCY, FRACTURES): Vit D, 25-Hydroxy: 24.3 ng/mL — ABNORMAL LOW (ref 30.0–100.0)

## 2021-02-05 LAB — INSULIN, RANDOM: INSULIN: 9.1 u[IU]/mL (ref 2.6–24.9)

## 2021-02-06 NOTE — Progress Notes (Signed)
Dear Dr. Katharine Look [name],   Thank you for referring Caitlin [name] to our clinic. The following note includes my evaluation and treatment recommendations.  Chief Complaint:   OBESITY Caitlin [name] (MR# [id number]) is a 50 y.o. female who presents for evaluation and treatment of obesity and related comorbidities. Current BMI is Body mass index is 34.61 kg/m. Caitlin [name] has been struggling with her weight for many years and has been unsuccessful in either losing weight, maintaining weight loss, or reaching her healthy weight goal.  Caitlin [name] is currently in the action stage of change and ready to dedicate time achieving and maintaining a healthier weight. Caitlin [name] is interested in becoming our patient and working on intensive lifestyle modifications including (but not limited to) diet and exercise for weight loss.  Caitlin [name] states that she does like to cook but struggles with finding meals that the whole family will eat.  Caitlin [name]'s habits were reviewed today and are as follows: Her family eats meals together, she thinks her family will eat healthier with her, her desired weight loss is 58 lbs, she has been heavy most of her life, she started gaining weight after her third child and taking Zoloft, her heaviest weight ever was 230 pounds, she has significant food cravings issues, she is frequently drinking liquids with calories, she frequently makes poor food choices, she frequently eats larger portions than normal, she has binge eating behaviors and she struggles with emotional eating.  Depression Screen Caitlin [name]'s Food and Mood (modified PHQ-9) score was 20.  Depression screen Caitlin [name] 2/9 02/04/2021  Decreased Interest 3  Down, Depressed, Hopeless 3  PHQ - 2 Score 6  Altered sleeping 2  Tired, decreased energy 3  Change in appetite 3  Feeling bad or failure about yourself  3  Trouble concentrating 1  Moving slowly or fidgety/restless 2  Suicidal thoughts 0  PHQ-9 Score 20  Difficult  doing work/chores Somewhat difficult   Subjective:   1. Other fatigue Caitlin [name] admits to daytime somnolence and admits to waking up still tired. Caitlin [name] has a history of symptoms of daytime fatigue. Caitlin [name] generally gets 7 hours of sleep per night, and states that she has difficulty falling back asleep if awakened. Snoring is not present. Apneic episodes are not present. Epworth Sleepiness Score is 12.  2. SOB (shortness of breath) on exertion Caitlin [name] notes increasing shortness of breath with exercising and seems to be worsening over time with weight gain. She notes getting out of breath sooner with activity than she used to. This has gotten worse recently. Caitlin [name] denies shortness of breath at rest or orthopnea.  3. Idiopathic intracranial hypertension Caitlin [name] is taking Topamax. Slightly elevated opening pressure for lumbar puncture.  BP Readings from Last 3 Encounters:  02/04/21 128/85  02/01/21 129/82  09/06/20 128/74    4. Vitamin D deficiency Caitlin [name] is taking OTC Vit D 1,000 units.  5. Elevated glucose Caitlin [name] has a history of some elevated blood glucose readings without a diagnosis of diabetes.  6. Elevated cholesterol Caitlin [name] has hyperlipidemia and has been trying to improve her cholesterol levels with intensive lifestyle modification including a low saturated fat diet, exercise and weight loss. She denies any chest pain, claudication or myalgias. She is not on statin therapy.  7. Other disorder of eating Caitlin [name]'s PHQ-9 score is 20. Caitlin [name] is struggling with emotional eating and using food for comfort to the extent that it is negatively impacting her health. She has been working on behavior modification techniques to help reduce  her emotional eating. She shows no sign of suicidal or homicidal ideations.  8. At risk for activity intolerance Caitlin [name] is at risk for exercise intolerance due to obesity and the weather.   Assessment/Plan:   1. Other fatigue Caitlin [name]  does feel that her weight is causing her energy to be lower than it should be. Fatigue may be related to obesity, depression or many other causes. Labs will be ordered, and in the meanwhile, Caitlin [name] will focus on self care including making healthy food choices, increasing physical activity and focusing on stress reduction.  - EKG 12-Lead - Hemoglobin A1c - Insulin, random - Lipid Panel With LDL/HDL Ratio - VITAMIN D 25 Hydroxy (Vit-D Deficiency, Fractures)  2. SOB (shortness of breath) on exertion Caitlin [name] does feel that she gets out of breath more easily that she used to when she exercises. Caitlin [name]'s shortness of breath appears to be obesity related and exercise induced. She has agreed to work on weight loss and gradually increase exercise to treat her exercise induced shortness of breath. Will continue to monitor closely.  3. Idiopathic intracranial hypertension Follow up with neurologist.  4. Vitamin D deficiency Low Vitamin D level contributes to fatigue and are associated with obesity, breast, and colon cancer. She agrees to continue to take Vitamin D @1 ,000 IU daily and will follow-up for routine testing of Vitamin D, at least 2-3 times per year to avoid over-replacement.  - VITAMIN D 25 Hydroxy (Vit-D Deficiency, Fractures)  5. Elevated glucose Fasting labs will be obtained and results with be discussed with Caitlin [name] in 2 weeks at her follow up visit. In the meanwhile Caitlin [name] was started on a lower simple carbohydrate diet and will work on weight loss efforts.  - Hemoglobin A1c - Insulin, random  6. Elevated cholesterol Cardiovascular risk and specific lipid/LDL goals reviewed.  We discussed several lifestyle modifications today and Caitlin [name] will continue to work on diet, exercise and weight loss efforts. Orders and follow up as documented in patient record.   Counseling Intensive lifestyle modifications are the first line treatment for this issue. . Dietary changes: Increase  soluble fiber. Decrease simple carbohydrates. . Exercise changes: Moderate to vigorous-intensity aerobic activity 150 minutes per week if tolerated. . Lipid-lowering medications: see documented in medical record.  - Lipid Panel With LDL/HDL Ratio  7. Other disorder of eating Behavior modification techniques were discussed today to help Caitlin [name] deal with her emotional/non-hunger eating behaviors.  Orders and follow up as documented in patient record. CBT techniques discussed to help with emotional eating.  8. Depression screening Caitlin [name] had a positive depression screening. Depression is commonly associated with obesity and often results in emotional eating behaviors. We will monitor this closely and work on CBT to help improve the non-hunger eating patterns. Referral to Psychology may be required if no improvement is seen as she continues in our clinic.  9. At risk for activity intolerance Caitlin [name] was given approximately 15 minutes of exercise intolerance counseling today. She is 50 y.o. female and has risk factors exercise intolerance including obesity. We discussed intensive lifestyle modifications today with an emphasis on specific weight loss instructions and strategies. Caitlin [name] will slowly increase activity as tolerated.  Repetitive spaced learning was employed today to elicit superior memory formation and behavioral change.  10. Class 1 obesity with serious comorbidity and body mass index (BMI) of 34.0 to 34.9 in adult, unspecified obesity type Shontay is currently in the action stage of change and her goal is to continue with weight loss efforts. I  recommend Aliegha begin the structured treatment plan as follows:  Sutton will focus on meal plan and intentional eating. Labs reviewed.  She has agreed to the Category 1 Plan.  Exercise goals: All adults should avoid inactivity. Some physical activity is better than none, and adults who participate in any amount of physical activity  gain some health benefits.   Behavioral modification strategies: increasing lean protein intake, decreasing simple carbohydrates, increasing vegetables, increasing water intake, decreasing eating out, no skipping meals, meal planning and cooking strategies, keeping healthy foods in the home and planning for success.  She was informed of the importance of frequent follow-up visits to maximize her success with intensive lifestyle modifications for her multiple health conditions. She was informed we would discuss her lab results at her next visit unless there is a critical issue that needs to be addressed sooner. Lisa agreed to keep her next visit at the agreed upon time to discuss these results.  Objective:   Blood pressure 128/85, pulse 100, temperature 98.4 F (36.9 C), height 5\' 5"  (1.651 m), weight 208 lb (94.3 kg), last menstrual period 02/03/2021, SpO2 96 %. Body mass index is 34.61 kg/m.  EKG: Normal sinus rhythm, rate 85.  Indirect Calorimeter completed today shows a VO2 of 111 and a REE of 771.  Her calculated basal metabolic rate is 9675 thus her basal metabolic rate is worse than expected.  General: Cooperative, alert, well developed, in no acute distress. HEENT: Conjunctivae and lids unremarkable. Cardiovascular: Regular rhythm.  Lungs: Normal work of breathing. Neurologic: No focal deficits.   Lab Results  Component Value Date   CREATININE 0.49 12/03/2010   BUN 10 12/03/2010   NA 136 12/03/2010   K 3.9 12/03/2010   CL 105 12/03/2010   CO2 22 12/03/2010   Lab Results  Component Value Date   ALT 20 12/03/2010   AST 27 12/03/2010   ALKPHOS 143 (H) 12/03/2010   BILITOT 0.6 12/03/2010   Lab Results  Component Value Date   HGBA1C 5.5 02/04/2021   Lab Results  Component Value Date   INSULIN 9.1 02/04/2021   No results found for: TSH Lab Results  Component Value Date   CHOL 232 (H) 02/04/2021   HDL 51 02/04/2021   LDLCALC 162 (H) 02/04/2021   TRIG 105  02/04/2021   Lab Results  Component Value Date   WBC 9.3 12/04/2010   HGB 12.4 12/04/2010   HCT 36.4 12/04/2010   MCV 95.5 12/04/2010   PLT 177 12/04/2010    Attestation Statements:   Reviewed by clinician on day of visit: allergies, medications, problem list, medical history, surgical history, family history, social history, and previous encounter notes.  Coral Ceo, am acting as Location manager for [employer], DO.  I have reviewed the above documentation for accuracy and completeness, and I agree with the above. Jearld Lesch, DO

## 2021-02-07 ENCOUNTER — Encounter (INDEPENDENT_AMBULATORY_CARE_PROVIDER_SITE_OTHER): Payer: Self-pay | Admitting: Bariatrics

## 2021-02-07 DIAGNOSIS — Z6834 Body mass index (BMI) 34.0-34.9, adult: Secondary | ICD-10-CM | POA: Insufficient documentation

## 2021-02-07 DIAGNOSIS — K589 Irritable bowel syndrome without diarrhea: Secondary | ICD-10-CM | POA: Insufficient documentation

## 2021-02-18 ENCOUNTER — Other Ambulatory Visit: Payer: Self-pay

## 2021-02-18 ENCOUNTER — Ambulatory Visit (INDEPENDENT_AMBULATORY_CARE_PROVIDER_SITE_OTHER): Payer: BC Managed Care – PPO | Admitting: Bariatrics

## 2021-02-18 ENCOUNTER — Encounter (INDEPENDENT_AMBULATORY_CARE_PROVIDER_SITE_OTHER): Payer: Self-pay | Admitting: Bariatrics

## 2021-02-18 VITALS — BP 125/82 | HR 81 | Temp 98.1°F | Ht 65.0 in | Wt 205.0 lb

## 2021-02-18 DIAGNOSIS — E6609 Other obesity due to excess calories: Secondary | ICD-10-CM

## 2021-02-18 DIAGNOSIS — E7800 Pure hypercholesterolemia, unspecified: Secondary | ICD-10-CM | POA: Diagnosis not present

## 2021-02-18 DIAGNOSIS — Z6834 Body mass index (BMI) 34.0-34.9, adult: Secondary | ICD-10-CM | POA: Diagnosis not present

## 2021-02-18 DIAGNOSIS — E559 Vitamin D deficiency, unspecified: Secondary | ICD-10-CM | POA: Insufficient documentation

## 2021-02-18 DIAGNOSIS — Z9189 Other specified personal risk factors, not elsewhere classified: Secondary | ICD-10-CM | POA: Diagnosis not present

## 2021-02-18 MED ORDER — VITAMIN D (ERGOCALCIFEROL) 1.25 MG (50000 UNIT) PO CAPS: 50000.0000 [IU] | ORAL_CAPSULE | ORAL | 0 refills | Status: DC

## 2021-02-19 NOTE — Progress Notes (Unsigned)
Chief Complaint:   OBESITY Caitlin [name] is here to discuss her progress with her obesity treatment plan along with follow-up of her obesity related diagnoses. Caitlin [name] is on the Category 1 Plan and states she is following her eating plan approximately 90% of the time. Caitlin [name] states she is doing strengthening for 45 minutes 3 times per week, and walking for 30 minutes 3 times per week.  Today's visit was #: 2 Starting weight: 208 lbs Starting date: 02/04/2021 Today's weight: 205 lbs Today's date: 02/18/2021 Total lbs lost to date: 3 Total lbs lost since last in-office visit: 3  Interim History: Caitlin [name] is down 3 lbs since her first visit. She states that it seemed like a lot of meat at first.  Subjective:   1. Vitamin D insufficiency Caitlin [name] is taking Vit D OTC, and her Vit D level was 24.3.   2. Elevated cholesterol Caitlin [name]'s total cholesterol is 232, LDL 162, and HDL 51. She is not on medications.  3. At risk for osteoporosis Caitlin [name] is at higher risk of osteopenia and osteoporosis due to Vitamin D deficiency.   Assessment/Plan:   1. Vitamin D insufficiency Low Vitamin D level contributes to fatigue and are associated with obesity, breast, and colon cancer. Caitlin [name] agreed to start prescription Vitamin D 50,000 IU every week #4 with no refills. She will follow-up for routine testing of Vitamin D, at least 2-3 times per year to avoid over-replacement.  2. Elevated cholesterol Cardiovascular risk and specific lipid/LDL goals reviewed. We discussed several lifestyle modifications today. Caitlin [name] will continue to work on diet, exercise and weight loss efforts. Orders and follow up as documented in patient record.   Counseling Intensive lifestyle modifications are the first line treatment for this issue. . Dietary changes: Increase soluble fiber. Decrease simple carbohydrates. . Exercise changes: Moderate to vigorous-intensity aerobic activity 150 minutes per week if  tolerated. . Lipid-lowering medications: see documented in medical record.  3. At risk for osteoporosis Caitlin [name] was given approximately 15 minutes of osteoporosis prevention counseling today. Caitlin [name] is at risk for osteopenia and osteoporosis due to her Vitamin D deficiency. She was encouraged to take her Vitamin D and follow her higher calcium diet and increase strengthening exercise to help strengthen her bones and decrease her risk of osteopenia and osteoporosis.  Repetitive spaced learning was employed today to elicit superior memory formation and behavioral change.  4. Class 1 obesity due to excess calories without serious comorbidity with body mass index (BMI) of 34.0 to 34.9 in adult Caitlin [name] is currently in the action stage of change. As such, her goal is to continue with weight loss efforts. She has agreed to the Category 1 Plan.   I reviewed labs from 02/04/2021 with the patient today. Protein Equivalents, and Eating Out handouts was given today.  Exercise goals: As is.  Behavioral modification strategies: increasing lean protein intake, decreasing simple carbohydrates, increasing vegetables, increasing water intake, decreasing eating out, no skipping meals, meal planning and cooking strategies, keeping healthy foods in the home and planning for success.  Caitlin [name] has agreed to follow-up with our clinic in 2 weeks. She was informed of the importance of frequent follow-up visits to maximize her success with intensive lifestyle modifications for her multiple health conditions.   Objective:   Blood pressure 125/82, pulse 81, temperature 98.1 F (36.7 C), height 5\' 5"  (1.651 m), weight 205 lb (93 kg), last menstrual period 02/03/2021, SpO2 97 %. Body mass index is 34.11 kg/m.  General: Cooperative, alert, well developed,  in no acute distress. HEENT: Conjunctivae and lids unremarkable. Cardiovascular: Regular rhythm.  Lungs: Normal work of breathing. Neurologic: No focal deficits.    Lab Results  Component Value Date   CREATININE 0.49 12/03/2010   BUN 10 12/03/2010   NA 136 12/03/2010   K 3.9 12/03/2010   CL 105 12/03/2010   CO2 22 12/03/2010   Lab Results  Component Value Date   ALT 20 12/03/2010   AST 27 12/03/2010   ALKPHOS 143 (H) 12/03/2010   BILITOT 0.6 12/03/2010   Lab Results  Component Value Date   HGBA1C 5.5 02/04/2021   Lab Results  Component Value Date   INSULIN 9.1 02/04/2021   No results found for: TSH Lab Results  Component Value Date   CHOL 232 (H) 02/04/2021   HDL 51 02/04/2021   LDLCALC 162 (H) 02/04/2021   TRIG 105 02/04/2021   Lab Results  Component Value Date   WBC 9.3 12/04/2010   HGB 12.4 12/04/2010   HCT 36.4 12/04/2010   MCV 95.5 12/04/2010   PLT 177 12/04/2010   No results found for: IRON, TIBC, FERRITIN  Attestation Statements:   Reviewed by clinician on day of visit: allergies, medications, problem list, medical history, surgical history, family history, social history, and previous encounter notes.   Wilhemena Durie, am acting as Location manager for [employer], DO.  I have reviewed the above documentation for accuracy and completeness, and I agree with the above. Jearld Lesch, DO

## 2021-02-20 ENCOUNTER — Encounter (INDEPENDENT_AMBULATORY_CARE_PROVIDER_SITE_OTHER): Payer: Self-pay | Admitting: Bariatrics

## 2021-03-05 ENCOUNTER — Encounter (INDEPENDENT_AMBULATORY_CARE_PROVIDER_SITE_OTHER): Payer: Self-pay | Admitting: Bariatrics

## 2021-03-05 ENCOUNTER — Ambulatory Visit (INDEPENDENT_AMBULATORY_CARE_PROVIDER_SITE_OTHER): Payer: BC Managed Care – PPO | Admitting: Bariatrics

## 2021-03-05 ENCOUNTER — Other Ambulatory Visit: Payer: Self-pay

## 2021-03-05 VITALS — BP 114/78 | HR 75 | Temp 98.4°F | Ht 65.0 in | Wt 203.0 lb

## 2021-03-05 DIAGNOSIS — E669 Obesity, unspecified: Secondary | ICD-10-CM | POA: Diagnosis not present

## 2021-03-05 DIAGNOSIS — G932 Benign intracranial hypertension: Secondary | ICD-10-CM

## 2021-03-05 DIAGNOSIS — Z6833 Body mass index (BMI) 33.0-33.9, adult: Secondary | ICD-10-CM | POA: Diagnosis not present

## 2021-03-05 DIAGNOSIS — E559 Vitamin D deficiency, unspecified: Secondary | ICD-10-CM | POA: Diagnosis not present

## 2021-03-07 NOTE — Progress Notes (Signed)
Chief Complaint:   OBESITY Caitlin [name] is here to discuss her progress with her obesity treatment plan along with follow-up of her obesity related diagnoses. Caitlin [name] is on the Category 1 Plan and states she is following her eating plan approximately 75% of the time. Caitlin [name] states she is walking for 45 minutes 3 times per week and strength training for 35-45 minutes 2 times per week.  Today's visit was #: 3 Starting weight: 208 lbs Starting date: 02/04/2021 Today's weight: 203 lbs Today's date: 03/05/2021 Total lbs lost to date: 5 lbs Total lbs lost since last in-office visit: 2 lbs  Interim History: Caitlin [name] is down an additional 2 pounds since her last visit.  She had a migraine last week and her cyclewhich increased her snacking.   Subjective:   1. Idiopathic intracranial hypertension She is not on any medications for this.  2. Vitamin D deficiency Caitlin [name]'s Vitamin D level was 24.3 on 02/04/2021. She is currently taking prescription vitamin D 50,000 IU each week. She denies nausea, vomiting or muscle weakness.  Assessment/Plan:   1. Idiopathic intracranial hypertension Will see the neurologist in 6 months.  2. Vitamin D deficiency Low Vitamin D level contributes to fatigue and are associated with obesity, breast, and colon cancer. She agrees to continue to take prescription Vitamin D @50 ,000 IU every week and will follow-up for routine testing of Vitamin D, at least 2-3 times per year to avoid over-replacement.  3. Class 1 obesity with serious comorbidity and body mass index (BMI) of 33.0 to 33.9 in adult, unspecified obesity type  Caitlin [name] is currently in the action stage of change. As such, her goal is to continue with weight loss efforts. She has agreed to the Category 1 Plan.   She will work on meal planning, mindful eating, and making smart fruit choices.  Recipes II handout provided.  Exercise goals: Continue walking and strength training.  Behavioral modification  strategies: increasing lean protein intake, decreasing simple carbohydrates, increasing vegetables, increasing water intake, decreasing eating out, no skipping meals, meal planning and cooking strategies, keeping healthy foods in the home, ways to avoid boredom eating, ways to avoid night time snacking, better snacking choices, emotional eating strategies and planning for success.  Caitlin [name] has agreed to follow-up with our clinic in 2 weeks. She was informed of the importance of frequent follow-up visits to maximize her success with intensive lifestyle modifications for her multiple health conditions.   Objective:   Blood pressure 114/78, pulse 75, temperature 98.4 F (36.9 C), height 5\' 5"  (1.651 m), weight 203 lb (92.1 kg), last menstrual period 03/02/2021, SpO2 97 %. Body mass index is 33.78 kg/m.  General: Cooperative, alert, well developed, in no acute distress. HEENT: Conjunctivae and lids unremarkable. Cardiovascular: Regular rhythm.  Lungs: Normal work of breathing. Neurologic: No focal deficits.   Lab Results  Component Value Date   CREATININE 0.49 12/03/2010   BUN 10 12/03/2010   NA 136 12/03/2010   K 3.9 12/03/2010   CL 105 12/03/2010   CO2 22 12/03/2010   Lab Results  Component Value Date   ALT 20 12/03/2010   AST 27 12/03/2010   ALKPHOS 143 (H) 12/03/2010   BILITOT 0.6 12/03/2010   Lab Results  Component Value Date   HGBA1C 5.5 02/04/2021   Lab Results  Component Value Date   INSULIN 9.1 02/04/2021   Lab Results  Component Value Date   CHOL 232 (H) 02/04/2021   HDL 51 02/04/2021   LDLCALC 162 (H)  02/04/2021   TRIG 105 02/04/2021   Lab Results  Component Value Date   WBC 9.3 12/04/2010   HGB 12.4 12/04/2010   HCT 36.4 12/04/2010   MCV 95.5 12/04/2010   PLT 177 12/04/2010   Attestation Statements:   Reviewed by clinician on day of visit: allergies, medications, problem list, medical history, surgical history, family history, social history, and  previous encounter notes.  Time spent on visit including pre-visit chart review and post-visit care and charting was 20 minutes.   I, Water quality scientist, CMA, am acting as Location manager for [employer], DO  I have reviewed the above documentation for accuracy and completeness, and I agree with the above. Jearld Lesch, DO

## 2021-03-14 ENCOUNTER — Other Ambulatory Visit: Payer: Self-pay

## 2021-03-14 ENCOUNTER — Telehealth: Payer: Self-pay | Admitting: Neurology

## 2021-03-14 MED ORDER — TOPIRAMATE 50 MG PO TABS
50.0000 mg | ORAL_TABLET | Freq: Every day | ORAL | 5 refills | Status: DC
Start: 2021-03-14 — End: 2021-03-18

## 2021-03-14 NOTE — Telephone Encounter (Signed)
Sent to Dr. Tomi Likens for authorization.

## 2021-03-14 NOTE — Telephone Encounter (Signed)
Patient needs a refill on here topamax sent to [facility]

## 2021-03-18 ENCOUNTER — Ambulatory Visit (INDEPENDENT_AMBULATORY_CARE_PROVIDER_SITE_OTHER): Payer: BC Managed Care – PPO | Admitting: Bariatrics

## 2021-03-18 ENCOUNTER — Other Ambulatory Visit: Payer: Self-pay

## 2021-03-18 ENCOUNTER — Encounter (INDEPENDENT_AMBULATORY_CARE_PROVIDER_SITE_OTHER): Payer: Self-pay | Admitting: Bariatrics

## 2021-03-18 VITALS — BP 128/76 | HR 76 | Temp 98.1°F | Ht 65.0 in | Wt 202.0 lb

## 2021-03-18 DIAGNOSIS — Z6833 Body mass index (BMI) 33.0-33.9, adult: Secondary | ICD-10-CM

## 2021-03-18 DIAGNOSIS — E559 Vitamin D deficiency, unspecified: Secondary | ICD-10-CM

## 2021-03-18 DIAGNOSIS — Z9189 Other specified personal risk factors, not elsewhere classified: Secondary | ICD-10-CM | POA: Diagnosis not present

## 2021-03-18 DIAGNOSIS — E6609 Other obesity due to excess calories: Secondary | ICD-10-CM | POA: Diagnosis not present

## 2021-03-18 DIAGNOSIS — E7800 Pure hypercholesterolemia, unspecified: Secondary | ICD-10-CM | POA: Diagnosis not present

## 2021-03-18 MED ORDER — TOPIRAMATE 50 MG PO TABS: 50.0000 mg | ORAL_TABLET | Freq: Every day | ORAL | 5 refills | Status: DC

## 2021-03-19 ENCOUNTER — Other Ambulatory Visit (INDEPENDENT_AMBULATORY_CARE_PROVIDER_SITE_OTHER): Payer: Self-pay | Admitting: Bariatrics

## 2021-03-19 DIAGNOSIS — E559 Vitamin D deficiency, unspecified: Secondary | ICD-10-CM

## 2021-03-20 MED ORDER — VITAMIN D (ERGOCALCIFEROL) 1.25 MG (50000 UNIT) PO CAPS: 50000.0000 [IU] | ORAL_CAPSULE | ORAL | 0 refills | Status: DC

## 2021-03-20 NOTE — Telephone Encounter (Signed)
Last seen by Dr. Brown. 

## 2021-03-20 NOTE — Progress Notes (Unsigned)
Chief Complaint:   OBESITY Caitlin [name] is here to discuss her progress with her obesity treatment plan along with follow-up of her obesity related diagnoses. Caitlin [name] is on the Category 1 Plan and states she is following her eating plan approximately 50% of the time. Caitlin [name] states she is doing 0 minutes 0 times per week.  Today's visit was #: 4 Starting weight: 208 lbs Starting date: 02/04/2021 Today's weight: 202 lbs Today's date: 03/18/2021 Total lbs lost to date: 6 Total lbs lost since last in-office visit: 1  Interim History: Caitlin [name] is down 1 lbs since her last visit. She has increased her protein in the afternoon. She is ok with her hunger.  Subjective:   1. Vitamin D deficiency Caitlin [name] is currently taking Vit D prescription.  2. Elevated cholesterol Caitlin [name] is not on medications currently.  3. At risk for heart disease Caitlin [name] is at a higher than average risk for cardiovascular disease due to obesity.   Assessment/Plan:   1. Vitamin D deficiency Low Vitamin D level contributes to fatigue and are associated with obesity, breast, and colon cancer. We will refill prescription Vitamin D 50,000 IU every week #4 for 1 month. Caitlin [name] will follow-up for routine testing of Vitamin D, at least 2-3 times per year to avoid over-replacement.  2. Elevated cholesterol Cardiovascular risk and specific lipid/LDL goals reviewed.  We discussed several lifestyle modifications today. Caitlin [name] will continue to work on diet, exercise and weight loss efforts. She is to decrease saturated fats and no trans fats, increase MUFAs and PUFAs. Orders and follow up as documented in patient record.   Counseling Intensive lifestyle modifications are the first line treatment for this issue. . Dietary changes: Increase soluble fiber. Decrease simple carbohydrates. . Exercise changes: Moderate to vigorous-intensity aerobic activity 150 minutes per week if tolerated. . Lipid-lowering medications: see  documented in medical record.  3. At risk for heart disease Caitlin [name] was given approximately 15 minutes of coronary artery disease prevention counseling today. She is 50 y.o. female and has risk factors for heart disease including obesity. We discussed intensive lifestyle modifications today with an emphasis on specific weight loss instructions and strategies.   Repetitive spaced learning was employed today to elicit superior memory formation and behavioral change.  4. Class 1 obesity due to excess calories without serious comorbidity with body mass index (BMI) of 33.0 to 33.9 in adult Caitlin [name] is currently in the action stage of change. As such, her goal is to continue with weight loss efforts. She has agreed to the Category 1 Plan.   Mindful eating was discussed.  Caitlin [name] will continue Topamax and we will refill for 1 month.  - topiramate (TOPAMAX) 50 MG tablet; Take 1 tablet (50 mg total) by mouth at bedtime.  Dispense: 30 tablet; Refill: 5  Exercise goals: Caitlin [name] is to increase walking when her back pain is better. (she pulled her back last week, and less activity).  Behavioral modification strategies: increasing lean protein intake, decreasing simple carbohydrates, increasing vegetables, increasing water intake, decreasing eating out, no skipping meals, meal planning and cooking strategies, keeping healthy foods in the home and planning for success.  Caitlin [name] has agreed to follow-up with our clinic in 2 weeks. She was informed of the importance of frequent follow-up visits to maximize her success with intensive lifestyle modifications for her multiple health conditions.   Objective:   Blood pressure 128/76, pulse 76, temperature 98.1 F (36.7 C), height 5\' 5"  (1.651 m), weight 202 lb (91.6 kg), last  menstrual period 03/02/2021, SpO2 98 %. Body mass index is 33.61 kg/m.  General: Cooperative, alert, well developed, in no acute distress. HEENT: Conjunctivae and lids  unremarkable. Cardiovascular: Regular rhythm.  Lungs: Normal work of breathing. Neurologic: No focal deficits.   Lab Results  Component Value Date   CREATININE 0.49 12/03/2010   BUN 10 12/03/2010   NA 136 12/03/2010   K 3.9 12/03/2010   CL 105 12/03/2010   CO2 22 12/03/2010   Lab Results  Component Value Date   ALT 20 12/03/2010   AST 27 12/03/2010   ALKPHOS 143 (H) 12/03/2010   BILITOT 0.6 12/03/2010   Lab Results  Component Value Date   HGBA1C 5.5 02/04/2021   Lab Results  Component Value Date   INSULIN 9.1 02/04/2021   No results found for: TSH Lab Results  Component Value Date   CHOL 232 (H) 02/04/2021   HDL 51 02/04/2021   LDLCALC 162 (H) 02/04/2021   TRIG 105 02/04/2021   Lab Results  Component Value Date   WBC 9.3 12/04/2010   HGB 12.4 12/04/2010   HCT 36.4 12/04/2010   MCV 95.5 12/04/2010   PLT 177 12/04/2010   No results found for: IRON, TIBC, FERRITIN  Attestation Statements:   Reviewed by clinician on day of visit: allergies, medications, problem list, medical history, surgical history, family history, social history, and previous encounter notes.   Wilhemena Durie, am acting as Location manager for [employer], DO.  I have reviewed the above documentation for accuracy and completeness, and I agree with the above. Jearld Lesch, DO

## 2021-03-20 NOTE — Telephone Encounter (Signed)
Refill request

## 2021-04-01 ENCOUNTER — Ambulatory Visit (INDEPENDENT_AMBULATORY_CARE_PROVIDER_SITE_OTHER): Payer: BC Managed Care – PPO | Admitting: Bariatrics

## 2021-04-01 ENCOUNTER — Other Ambulatory Visit: Payer: Self-pay

## 2021-04-01 ENCOUNTER — Encounter (INDEPENDENT_AMBULATORY_CARE_PROVIDER_SITE_OTHER): Payer: Self-pay | Admitting: Bariatrics

## 2021-04-01 VITALS — BP 125/82 | HR 76 | Temp 98.1°F | Ht 65.0 in | Wt 202.0 lb

## 2021-04-01 DIAGNOSIS — G932 Benign intracranial hypertension: Secondary | ICD-10-CM | POA: Diagnosis not present

## 2021-04-01 DIAGNOSIS — F5089 Other specified eating disorder: Secondary | ICD-10-CM

## 2021-04-01 DIAGNOSIS — Z6834 Body mass index (BMI) 34.0-34.9, adult: Secondary | ICD-10-CM | POA: Diagnosis not present

## 2021-04-01 DIAGNOSIS — E6609 Other obesity due to excess calories: Secondary | ICD-10-CM | POA: Diagnosis not present

## 2021-04-08 ENCOUNTER — Encounter (INDEPENDENT_AMBULATORY_CARE_PROVIDER_SITE_OTHER): Payer: Self-pay | Admitting: Bariatrics

## 2021-04-08 NOTE — Progress Notes (Signed)
     Chief Complaint:   OBESITY Caitlin [name] is here to discuss her progress with her obesity treatment plan along with follow-up of her obesity related diagnoses. Caitlin [name] is on the Category 1 Plan and states she is following her eating plan approximately 60% of the time. [name] states she is walking for 30 minutes 3 times per week.  Today's visit was #: 5 Starting weight: 208 lbs Starting date: 02/04/2021 Today's weight: 202 lbs Today's date: 04/01/2021 Total lbs lost to date: 6 lbs Total lbs lost since last in-office visit: 0  Interim History: [name]'s weight remains the same as it was at her last visit.  She had not been planning as much.  Subjective:   1. Idiopathic intracranial hypertension No medications.  2. Other disorder of eating She is taking Topamax.  She says it is "helping".  Assessment/Plan:   1. Idiopathic intracranial hypertension Will follow-up with her ophthalmologist and neurologist as needed.  2. Other disorder of eating Continue Topamax.   3. Obesity , current BMI 61  Caitlin [name] is currently in the action stage of change. As such, her goal is to continue with weight loss efforts. She has agreed to the Category 1 Plan.   She will work on meal planning, will adhere closely to the plan, and will do more scheduling and planning more.  Exercise goals: As is.  Behavioral modification strategies: increasing lean protein intake, decreasing simple carbohydrates, increasing vegetables, increasing water intake, decreasing eating out, no skipping meals, meal planning and cooking strategies, keeping healthy foods in the home and planning for success.  Tomorrow has agreed to follow-up with our clinic in 2 weeks. She was informed of the importance of frequent follow-up visits to maximize her success with intensive lifestyle modifications for her multiple health conditions.   Objective:   Blood pressure 125/82, pulse 76, temperature 98.1 F (36.7 C), height 5\' 5"   (1.651 m), weight 202 lb (91.6 kg), last menstrual period 03/27/2021, SpO2 98 %. Body mass index is 33.61 kg/m.  General: Cooperative, alert, well developed, in no acute distress. HEENT: Conjunctivae and lids unremarkable. Cardiovascular: Regular rhythm.  Lungs: Normal work of breathing. Neurologic: No focal deficits.   Lab Results  Component Value Date   CREATININE 0.49 12/03/2010   BUN 10 12/03/2010   NA 136 12/03/2010   K 3.9 12/03/2010   CL 105 12/03/2010   CO2 22 12/03/2010   Lab Results  Component Value Date   ALT 20 12/03/2010   AST 27 12/03/2010   ALKPHOS 143 (H) 12/03/2010   BILITOT 0.6 12/03/2010   Lab Results  Component Value Date   HGBA1C 5.5 02/04/2021   Lab Results  Component Value Date   INSULIN 9.1 02/04/2021   Lab Results  Component Value Date   CHOL 232 (H) 02/04/2021   HDL 51 02/04/2021   LDLCALC 162 (H) 02/04/2021   TRIG 105 02/04/2021   Lab Results  Component Value Date   WBC 9.3 12/04/2010   HGB 12.4 12/04/2010   HCT 36.4 12/04/2010   MCV 95.5 12/04/2010   PLT 177 12/04/2010   Attestation Statements:   Reviewed by clinician on day of visit: allergies, medications, problem list, medical history, surgical history, family history, social history, and previous encounter notes.  I, Water quality scientist, CMA, am acting as Location manager for [employer], DO  I have reviewed the above documentation for accuracy and completeness, and I agree with the above. Jearld Lesch, DO

## 2021-04-22 ENCOUNTER — Ambulatory Visit (INDEPENDENT_AMBULATORY_CARE_PROVIDER_SITE_OTHER): Payer: BC Managed Care – PPO | Admitting: Bariatrics

## 2021-07-30 NOTE — Progress Notes (Signed)
Virtual Visit via Video Note The purpose of this virtual visit is to provide medical care while limiting exposure to the novel coronavirus.    Consent was obtained for video visit:  Yes.   Answered questions that patient had about telehealth interaction:  Yes.   I discussed the limitations, risks, security and privacy concerns of performing an evaluation and management service by telemedicine. I also discussed with the patient that there may be a patient responsible charge related to this service. The patient expressed understanding and agreed to proceed.  Pt location: Home Physician Location: office Name of referring provider:  [name], MD I connected with Jeanette Caprice at patients initiation/request on 07/31/2021 at  1:50 PM EDT by video enabled telemedicine application and verified that I am speaking with the correct person using two identifiers. Pt MRN:  [id number] Pt DOB:  [birth year] Video Participants:  Jeanette Caprice  Assessment and Plan:   History of migraine with aura presenting with occipital headaches, pulsatile tinnitus and questionable papilledema.  Opening pressure on LP only borderline elevated at 23 cm water but given associated symptoms and MRI findings, consider idiopathic intracranial hypertension.  Topiramate '50mg'$  at bedtime Follow up 9 months.  History of Present Illness:  Caitlin [name] is a 50 year old right-handed female who follows up for migraines and mild idiopathic intracranial hypertension   UPDATE:  She had a repeat eye exam with Dr. Katy Fitch a couple of months ago.  Exam was good - stable or slightly improved.  No migraines.  Occasional dull pressure headache.  No pulsatile tinnitus.  No visual changes.  Current NSAIDs/analgesics:  ibuprofen Current Anticonvulsant medications:  topiramate '50mg'$    HISTORY: She has only had one dull headache since starting the topiramate, when it was snowing, and lasted a couple of hours and didn't  require any medication.  Pulsating sensation has decreased significantly.  She may hear it once in a while.  If she stands up quickly or bends over, she may get the head pressure briefly.  She saw Dr. Katy Fitch again and showed minimal improvement in optic nerve but overall stable     HISTORY: She has longstanding history of migraine presenting with visual aura of wavy lines followed by blind spot preceding headache.  Several years ago, she was started on Mirena and developed bilateral pulsatile tinnitus.  She began having new headaches in the Fall of 2020.  They are severe right occipital shooting pain associated with nausea, photophobia, and phonophobia but not associated with speech disturbance, dizziness, numbness or weakness.  She treats with ibuprofen.  In April 2021, she had a cough that triggered a particularly severe occipital headache.  It was aggravated by change in position, either standing up or bending over.  The severe headache subsided but she had a persistent dull right occipital pressure that lasted 2 weeks.  Other than her typical headaches, she has not had a recurrence of that occipital headache since then.  CT of head on 04/26/2020 was unremarkable.  She had an MRI of brain and internal auditory canals on 06/05/2020 which was normal.  She saw her ophthalmologist, Dr. 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  Visual fields were full.  Repeat exam on 07/05/2020 was stable.  She denies visual obscurations.  She underwent workup for pulsatile tinnitus and idiopathic intracranial hypertension.  TSH from 05/12/2020 was 0.60.   MRA of head and neck on 08/29/2020 was normal.  MRV of head on 08/31/2020  showed narrowed distal transverse sinus bilaterally and partially empty sella but no thrombosis.  She underwent LP on 09/06/2020 which demonstrated opening pressure of 23 cm water and closing pressure of 17 cm water.    Past Medical History: Past Medical History:  Diagnosis Date    Anxiety    Back pain    Edema of both lower extremities    Elevated cholesterol    Endometriosis    Endometriosis    Gallbladder problem    High cholesterol    History of IBS    IBS (irritable bowel syndrome)    Infertility, female    Vitamin D deficiency     Medications: Outpatient Encounter Medications as of 07/31/2021  Medication Sig   topiramate (TOPAMAX) 50 MG tablet Take 1 tablet (50 mg total) by mouth at bedtime.   Vitamin D, Ergocalciferol, (DRISDOL) 1.25 MG (50000 UNIT) CAPS capsule Take 1 capsule (50,000 Units total) by mouth every 7 (seven) days.   No facility-administered encounter medications on file as of 07/31/2021.    Allergies: Allergies  Allergen Reactions   Ciprofloxacin Other (See Comments)    "achy joints" "achy joints"    Other Rash    SULFA DRUGS    Family History: Family History  Problem Relation Age of Onset   Hypertension Father    Diabetes Maternal Aunt    Cancer Paternal Grandmother        ovarian   Thyroid disease Mother    Depression Mother    Anxiety disorder Mother    Obesity Mother     Observations/Objective:   Height '5\' 5"'$  (1.651 m), weight 205 lb (93 kg). No acute distress.  Alert and oriented.  Speech fluent and not dysarthric.  Language intact.     Follow Up Instructions:    -I discussed the assessment and treatment plan with the patient. The patient was provided an opportunity to ask questions and all were answered. The patient agreed with the plan and demonstrated an understanding of the instructions.   The patient was advised to call back or seek an in-person evaluation if the symptoms worsen or if the condition fails to improve as anticipated.   Dudley Major, DO

## 2021-07-31 ENCOUNTER — Other Ambulatory Visit: Payer: Self-pay

## 2021-07-31 ENCOUNTER — Telehealth (INDEPENDENT_AMBULATORY_CARE_PROVIDER_SITE_OTHER): Payer: BC Managed Care – PPO | Admitting: Neurology

## 2021-07-31 ENCOUNTER — Encounter: Payer: Self-pay | Admitting: Neurology

## 2021-07-31 VITALS — Ht 65.0 in | Wt 205.0 lb

## 2021-07-31 DIAGNOSIS — G932 Benign intracranial hypertension: Secondary | ICD-10-CM | POA: Diagnosis not present

## 2021-07-31 DIAGNOSIS — Z6833 Body mass index (BMI) 33.0-33.9, adult: Secondary | ICD-10-CM | POA: Diagnosis not present

## 2021-07-31 DIAGNOSIS — E6609 Other obesity due to excess calories: Secondary | ICD-10-CM

## 2021-07-31 DIAGNOSIS — G43009 Migraine without aura, not intractable, without status migrainosus: Secondary | ICD-10-CM

## 2021-07-31 MED ORDER — TOPIRAMATE 50 MG PO TABS: 50.0000 mg | ORAL_TABLET | Freq: Every day | ORAL | 5 refills | Status: DC

## 2021-08-07 ENCOUNTER — Ambulatory Visit: Payer: BC Managed Care – PPO | Admitting: Neurology

## 2021-11-12 ENCOUNTER — Other Ambulatory Visit: Payer: Self-pay

## 2021-11-12 DIAGNOSIS — Z79899 Other long term (current) drug therapy: Secondary | ICD-10-CM

## 2021-11-12 NOTE — Progress Notes (Signed)
First, I would need a baseline potassium level.  We will need to check a BMP.  Based on that value, it will help me decide if I need to start the furosemide with a potassium supplement

## 2021-11-13 ENCOUNTER — Other Ambulatory Visit: Payer: Self-pay

## 2021-11-13 ENCOUNTER — Other Ambulatory Visit (INDEPENDENT_AMBULATORY_CARE_PROVIDER_SITE_OTHER): Payer: BC Managed Care – PPO

## 2021-11-13 DIAGNOSIS — Z79899 Other long term (current) drug therapy: Secondary | ICD-10-CM | POA: Diagnosis not present

## 2021-11-13 LAB — POTASSIUM: Potassium: 4 mEq/L (ref 3.5–5.1)

## 2021-11-13 LAB — BASIC METABOLIC PANEL
BUN: 18 mg/dL (ref 6–23)
CO2: 25 mEq/L (ref 19–32)
Calcium: 9.2 mg/dL (ref 8.4–10.5)
Chloride: 106 mEq/L (ref 96–112)
Creatinine, Ser: 0.79 mg/dL (ref 0.40–1.20)
GFR: 88.53 mL/min (ref >60.00)
Glucose, Bld: 80 mg/dL (ref 70–99)
Potassium: 4 mEq/L (ref 3.5–5.1)
Sodium: 139 mEq/L (ref 135–145)

## 2021-11-14 ENCOUNTER — Telehealth: Payer: Self-pay

## 2021-11-14 DIAGNOSIS — Z79899 Other long term (current) drug therapy: Secondary | ICD-10-CM

## 2021-11-14 MED ORDER — FUROSEMIDE 20 MG PO TABS: 20.0000 mg | ORAL_TABLET | Freq: Every day | ORAL | 1 refills | Status: DC

## 2021-11-14 NOTE — Telephone Encounter (Signed)
Patient advised of her Lab results.  Furosemide 20 mg sent to the patient pharmacy.  Repeat lab added to the chart.

## 2021-11-14 NOTE — Telephone Encounter (Signed)
-----   Message from Pieter Partridge, DO sent at 11/13/2021  4:12 PM EST ----- Labs reviewed. I would like to start furosemide 20mg  daily.  I would like to repeat basic metabolic panel in 4 weeks.  I would like for her to have a repeat eye exam in 6 to 8 weeks.

## 2021-11-19 ENCOUNTER — Telehealth: Payer: Self-pay | Admitting: Neurology

## 2021-11-19 NOTE — Telephone Encounter (Signed)
Topiramate is the medication to taper.

## 2021-11-19 NOTE — Telephone Encounter (Signed)
Pt would like a call to discuss how she needs to taper off her meds

## 2021-11-20 ENCOUNTER — Telehealth: Payer: Self-pay | Admitting: Neurology

## 2021-11-20 NOTE — Telephone Encounter (Signed)
Tried calling pt no answer.LMOVM to call the office.   Take 1/2 tablet at bedtime for one week, then STOP

## 2021-11-20 NOTE — Telephone Encounter (Signed)
Pt called back and informed to take Take 1/2 tablet at bedtime for one week, then STOP topiramate

## 2021-11-20 NOTE — Telephone Encounter (Signed)
Pt is returning a call to someone about dosage

## 2021-11-26 NOTE — Telephone Encounter (Signed)
Pt advised of DR.Jaffe note.

## 2021-12-31 ENCOUNTER — Other Ambulatory Visit (INDEPENDENT_AMBULATORY_CARE_PROVIDER_SITE_OTHER): Payer: BC Managed Care – PPO

## 2021-12-31 ENCOUNTER — Other Ambulatory Visit: Payer: Self-pay

## 2021-12-31 DIAGNOSIS — Z79899 Other long term (current) drug therapy: Secondary | ICD-10-CM

## 2021-12-31 LAB — BASIC METABOLIC PANEL
BUN: 16 mg/dL (ref 6–23)
CO2: 26 mEq/L (ref 19–32)
Calcium: 9.2 mg/dL (ref 8.4–10.5)
Chloride: 104 mEq/L (ref 96–112)
Creatinine, Ser: 0.7 mg/dL (ref 0.40–1.20)
GFR: 102.26 mL/min (ref >60.00)
Glucose, Bld: 100 mg/dL — ABNORMAL HIGH (ref 70–99)
Potassium: 4.8 mEq/L (ref 3.5–5.1)
Sodium: 137 mEq/L (ref 135–145)

## 2022-02-03 ENCOUNTER — Other Ambulatory Visit: Payer: Self-pay | Admitting: Neurology

## 2022-02-03 ENCOUNTER — Encounter: Payer: Self-pay | Admitting: Neurology

## 2022-02-03 MED ORDER — FUROSEMIDE 20 MG PO TABS: 20.0000 mg | ORAL_TABLET | Freq: Every day | ORAL | 1 refills | Status: DC

## 2022-02-10 DIAGNOSIS — Z1231 Encounter for screening mammogram for malignant neoplasm of breast: Secondary | ICD-10-CM | POA: Diagnosis not present

## 2022-02-20 ENCOUNTER — Other Ambulatory Visit: Payer: Self-pay | Admitting: Obstetrics and Gynecology

## 2022-02-20 DIAGNOSIS — R928 Other abnormal and inconclusive findings on diagnostic imaging of breast: Secondary | ICD-10-CM

## 2022-02-20 DIAGNOSIS — Z1231 Encounter for screening mammogram for malignant neoplasm of breast: Secondary | ICD-10-CM | POA: Diagnosis not present

## 2022-02-20 DIAGNOSIS — Z01419 Encounter for gynecological examination (general) (routine) without abnormal findings: Secondary | ICD-10-CM | POA: Diagnosis not present

## 2022-02-20 DIAGNOSIS — Z1211 Encounter for screening for malignant neoplasm of colon: Secondary | ICD-10-CM | POA: Diagnosis not present

## 2022-02-20 DIAGNOSIS — Z3040 Encounter for surveillance of contraceptives, unspecified: Secondary | ICD-10-CM | POA: Diagnosis not present

## 2022-02-20 DIAGNOSIS — N809 Endometriosis, unspecified: Secondary | ICD-10-CM | POA: Insufficient documentation

## 2022-02-24 ENCOUNTER — Other Ambulatory Visit: Payer: Self-pay | Admitting: Obstetrics and Gynecology

## 2022-02-24 DIAGNOSIS — R928 Other abnormal and inconclusive findings on diagnostic imaging of breast: Secondary | ICD-10-CM

## 2022-02-25 DIAGNOSIS — H47323 Drusen of optic disc, bilateral: Secondary | ICD-10-CM | POA: Diagnosis not present

## 2022-02-25 DIAGNOSIS — H4711 Papilledema associated with increased intracranial pressure: Secondary | ICD-10-CM | POA: Diagnosis not present

## 2022-02-25 DIAGNOSIS — H1045 Other chronic allergic conjunctivitis: Secondary | ICD-10-CM | POA: Diagnosis not present

## 2022-02-25 DIAGNOSIS — H04123 Dry eye syndrome of bilateral lacrimal glands: Secondary | ICD-10-CM | POA: Diagnosis not present

## 2022-03-10 ENCOUNTER — Ambulatory Visit
Admission: RE | Admit: 2022-03-10 | Discharge: 2022-03-10 | Disposition: A | Discharge status: Home / Self Care | Payer: BC Managed Care – PPO | Source: Ambulatory Visit | Attending: Obstetrics and Gynecology | Admitting: Obstetrics and Gynecology

## 2022-03-10 ENCOUNTER — Other Ambulatory Visit: Payer: Self-pay | Admitting: Obstetrics and Gynecology

## 2022-03-10 DIAGNOSIS — R928 Other abnormal and inconclusive findings on diagnostic imaging of breast: Secondary | ICD-10-CM

## 2022-03-10 DIAGNOSIS — R922 Inconclusive mammogram: Secondary | ICD-10-CM | POA: Diagnosis not present

## 2022-03-20 ENCOUNTER — Ambulatory Visit
Admission: RE | Admit: 2022-03-20 | Discharge: 2022-03-20 | Disposition: A | Discharge status: Home / Self Care | Payer: BC Managed Care – PPO | Source: Ambulatory Visit | Attending: Obstetrics and Gynecology | Admitting: Obstetrics and Gynecology

## 2022-03-20 DIAGNOSIS — R928 Other abnormal and inconclusive findings on diagnostic imaging of breast: Secondary | ICD-10-CM

## 2022-03-20 DIAGNOSIS — C50412 Malignant neoplasm of upper-outer quadrant of left female breast: Secondary | ICD-10-CM | POA: Diagnosis not present

## 2022-03-20 DIAGNOSIS — N6321 Unspecified lump in the left breast, upper outer quadrant: Secondary | ICD-10-CM | POA: Diagnosis not present

## 2022-03-31 ENCOUNTER — Other Ambulatory Visit: Payer: Self-pay | Admitting: Surgery

## 2022-03-31 DIAGNOSIS — Z853 Personal history of malignant neoplasm of breast: Secondary | ICD-10-CM

## 2022-03-31 DIAGNOSIS — C50912 Malignant neoplasm of unspecified site of left female breast: Secondary | ICD-10-CM | POA: Diagnosis not present

## 2022-04-02 ENCOUNTER — Encounter: Payer: Self-pay | Admitting: *Deleted

## 2022-04-02 ENCOUNTER — Other Ambulatory Visit: Payer: Self-pay | Admitting: Surgery

## 2022-04-02 ENCOUNTER — Telehealth: Payer: Self-pay | Admitting: Genetic Counselor

## 2022-04-02 ENCOUNTER — Telehealth: Payer: Self-pay | Admitting: Hematology and Oncology

## 2022-04-02 DIAGNOSIS — Z853 Personal history of malignant neoplasm of breast: Secondary | ICD-10-CM

## 2022-04-02 DIAGNOSIS — Z170 Estrogen receptor positive status [ER+]: Secondary | ICD-10-CM | POA: Insufficient documentation

## 2022-04-02 NOTE — Therapy (Signed)
?OUTPATIENT PHYSICAL THERAPY BREAST CANCER BASELINE EVALUATION ? ? ?Patient Name: Caitlin [name] ?MRN: [id number] ?DOB:[birth year], 51 y.o., female ?Today's Date: 04/03/2022 ? ? PT End of Session - 04/03/22 0855   ? ? Visit Number 1   ? Number of Visits 2   ? Date for PT Re-Evaluation 05/21/22   ? PT Start Time 0900   ? PT Stop Time 0925   ? PT Time Calculation (min) 25 min   ? Activity Tolerance Patient tolerated treatment well   ? Behavior During Therapy [facility] for tasks assessed/performed   ? ?  ?  ? ?  ? ? ?Past Medical History:  ?Diagnosis Date  ? Anxiety   ? Back pain   ? Edema of both lower extremities   ? Elevated cholesterol   ? Endometriosis   ? Endometriosis   ? Gallbladder problem   ? High cholesterol   ? History of IBS   ? IBS (irritable bowel syndrome)   ? Infertility, female   ? Vitamin D deficiency   ? ?Past Surgical History:  ?Procedure Laterality Date  ? CHOLECYSTECTOMY  2018  ? LAPAROSCOPY    ? ?Patient Active Problem List  ? Diagnosis Date Noted  ? Malignant neoplasm of upper-outer quadrant of left breast in female, estrogen receptor positive ([place]) 04/02/2022  ? Vitamin D insufficiency 02/18/2021  ? Elevated cholesterol 02/18/2021  ? Class 1 obesity due to excess calories with body mass index (BMI) of 34.0 to 34.9 in adult 02/07/2021  ? Irritable bowel syndrome 02/07/2021  ? ? ?PCP: [name], MD ? ?REFERRING PROVIDER: Coralie Keens, MD ? ?REFERRING DIAG: Lt breast cancer ? ?THERAPY DIAG:  ?Malignant neoplasm of upper-outer quadrant of left breast in female, estrogen receptor positive ([place]) - Plan: PT plan of care cert/re-cert ? ?Abnormal posture - Plan: PT plan of care cert/re-cert ? ?ONSET DATE: 03/29/22 ? ?SUBJECTIVE                                                                                                                                                                                          ? ?SUBJECTIVE STATEMENT: ?Patient reports she is here today to be seen by her medical team  for her newly diagnosed left breast cancer.  ? ?PERTINENT HISTORY:  ?Patient was diagnosed with left IDC. ER/PR positive, HER2 negative KI67 less than 5%. Pt will have lumpectomy and SLNB on 04/22/22 with Dr. [name]. Radiation pending. No chemotherapy.   ? ?PATIENT GOALS   reduce lymphedema risk and learn post op HEP.  ? ?PAIN:  ?Are you having pain? No ? ? ?PRECAUTIONS: Active CA  ? ?HAND DOMINANCE:  right ? ?WEIGHT BEARING RESTRICTIONS No ? ?FALLS:  ?Has patient fallen in last 6 months? No ? ?LIVING ENVIRONMENT: ?Patient lives with: husband and 3 kids  ?Lives in: House/apartment ?Has following equipment at home: None ? ?OCCUPATION: editor  ? ?LEISURE: walking  ? ?PRIOR LEVEL OF FUNCTION: Independent ? ? ?OBJECTIVE ? ?COGNITION: ? Overall cognitive status: Within functional limits for tasks assessed   ? ?POSTURE:  ?Forward head and rounded shoulders posture ? ?UPPER EXTREMITY AROM/PROM: ? ?A/PROM RIGHT  04/03/2022 ?  ?Shoulder extension   ?Shoulder flexion   ?Shoulder abduction   ?Shoulder internal rotation   ?Shoulder external rotation   ?  (Blank rows = not tested) ? ?A/PROM LEFT  04/03/2022  ?Shoulder extension 68  ?Shoulder flexion 160  ?Shoulder abduction 165  ?Shoulder internal rotation   ?Shoulder external rotation 100  ?  (Blank rows = not tested) ? ?LYMPHEDEMA ASSESSMENTS:  ? ?[place] RIGHT  04/03/2022  ?10 cm proximal to olecranon process   ?Olecranon process   ?10 cm proximal to ulnar styloid process   ?Just proximal to ulnar styloid process   ?Across hand at thumb web space   ?At base of 2nd digit   ?(Blank rows = not tested) ? ?[place] LEFT  04/03/2022  ?10 cm proximal to olecranon process 37  ?Olecranon process 29.5  ?10 cm proximal to ulnar styloid process 24  ?Just proximal to ulnar styloid process 17.1  ?Across hand at thumb web space 19.6  ?At base of 2nd digit 6.5  ?(Blank rows = not tested) ? ? ?L-DEX LYMPHEDEMA SCREENING: ? ?The patient was assessed using the L-Dex machine today to produce a  lymphedema index baseline score. The patient will be reassessed on a regular basis (typically every 3 months) to obtain new L-Dex scores. If the score is > 6.5 points away from his/her baseline score indicating onset of subclinical lymphedema, it will be recommended to wear a compression garment for 4 weeks, 12 hours per day and then be reassessed. If the score continues to be > 6.5 points from baseline at reassessment, we will initiate lymphedema treatment. Assessing in this manner has a 95% rate of preventing clinically significant lymphedema. ? ? ? ? ?QUICK DASH SURVEY:0% ? ? ?PATIENT EDUCATION:  ?Education details: Lymphedema risk reduction and post op shoulder/posture HEP ?Person educated: Patient ?Education method: Explanation, Demonstration, Handout ?Education comprehension: Patient verbalized understanding and returned demonstration ? ? ?HOME EXERCISE PROGRAM: ?Patient was instructed today in a home exercise program today for post op shoulder range of motion. These included active assist shoulder flexion in sitting, scapular retraction, wall walking with shoulder abduction, and hands behind head external rotation.  She was encouraged to do these twice a day, holding 3 seconds and repeating 5 times when permitted by her physician. ? ? ?ASSESSMENT: ? ?CLINICAL IMPRESSION: ?Pt will benefit from a post op PT reassessment to determine needs and from L-Dex screens every 3 months for 2 years to detect subclinical lymphedema. ? ?Pt will benefit from skilled therapeutic intervention to improve on the following deficits: Decreased knowledge of precautions, impaired UE functional use, pain, decreased ROM, postural dysfunction.  ? ?PT treatment/interventions: ADL/self-care home management, pt/family education, therapeutic exercise ? ?REHAB POTENTIAL: Excellent ? ?CLINICAL DECISION MAKING: Stable/uncomplicated ? ?EVALUATION COMPLEXITY: Low ? ? ?GOALS: ?Goals reviewed with patient? YES ? ?LONG TERM GOALS: (STG=LTG) ? ?  Name Target Date Goal status  ?1 Pt will be able to verbalize understanding of pertinent lymphedema risk reduction practices relevant to her  dx specifically related to skin care.  ?Baseline:  No knowledge 04/03/2022 Achieved at eval  ?2 Pt will be able to return demo and/or verbalize understanding of the post op HEP related to regaining shoulder ROM. ?Baseline:  No knowledge 04/03/2022 Achieved at eval  ?3 Pt will be able to verbalize understanding of the importance of attending the post op After Breast CA Class for further lymphedema risk reduction education and therapeutic exercise.  ?Baseline:  No knowledge 04/03/2022 Achieved at eval  ?4 Pt will demo she has regained full shoulder ROM and function post operatively compared to baselines.  ?Baseline: See objective measurements taken today. 05/17/22   ? ? ? ?PLAN: ?PT FREQUENCY/DURATION: EVAL and 1 follow up appointment.  ? ?PLAN FOR NEXT SESSION: will reassess 3-4 weeks post op to determine needs. ?  ?Patient will follow up at outpatient cancer rehab 3-4 weeks following surgery.  If the patient requires physical therapy at that time, a specific plan will be dictated and sent to the referring physician for approval. ?The patient was educated today on appropriate basic range of motion exercises to begin post operatively and the importance of attending the After Breast Cancer class following surgery.  Patient was educated today on lymphedema risk reduction practices as it pertains to recommendations that will benefit the patient immediately following surgery.  She verbalized good understanding.   ? ?Physical Therapy Information for After Breast Cancer Surgery/Treatment: ? ?Lymphedema is a swelling condition that you may be at risk for in your arm if you have lymph nodes removed from the armpit area.  After a sentinel node biopsy, the risk is approximately 5-9% and is higher after an axillary node dissection.  There is treatment available for this condition and it is not  life-threatening.  Contact your physician or physical therapist with concerns. ?You may begin the 4 shoulder/posture exercises (see additional sheet) when permitted by your physician (typically a week after surger

## 2022-04-02 NOTE — Telephone Encounter (Signed)
Scheduled appt per 4/5 referral. Pt is aware of appt date and time. Pt is aware to arrive 15 mins prior to appt time and to bring and updated insurance card. Pt is aware of appt location.   ?

## 2022-04-03 ENCOUNTER — Ambulatory Visit: Payer: BC Managed Care – PPO | Attending: Surgery | Admitting: Rehabilitation

## 2022-04-03 ENCOUNTER — Encounter: Payer: Self-pay | Admitting: Rehabilitation

## 2022-04-03 ENCOUNTER — Encounter: Payer: Self-pay | Admitting: Neurology

## 2022-04-03 DIAGNOSIS — C50412 Malignant neoplasm of upper-outer quadrant of left female breast: Secondary | ICD-10-CM | POA: Insufficient documentation

## 2022-04-03 DIAGNOSIS — R293 Abnormal posture: Secondary | ICD-10-CM | POA: Insufficient documentation

## 2022-04-03 DIAGNOSIS — Z170 Estrogen receptor positive status [ER+]: Secondary | ICD-10-CM | POA: Insufficient documentation

## 2022-04-09 ENCOUNTER — Encounter: Payer: Self-pay | Admitting: *Deleted

## 2022-04-14 ENCOUNTER — Inpatient Hospital Stay: Payer: BC Managed Care – PPO | Attending: Genetic Counselor

## 2022-04-14 ENCOUNTER — Other Ambulatory Visit: Payer: Self-pay

## 2022-04-14 DIAGNOSIS — G932 Benign intracranial hypertension: Secondary | ICD-10-CM | POA: Insufficient documentation

## 2022-04-14 DIAGNOSIS — C50412 Malignant neoplasm of upper-outer quadrant of left female breast: Secondary | ICD-10-CM | POA: Insufficient documentation

## 2022-04-14 DIAGNOSIS — Z853 Personal history of malignant neoplasm of breast: Secondary | ICD-10-CM | POA: Diagnosis not present

## 2022-04-14 DIAGNOSIS — Z8051 Family history of malignant neoplasm of kidney: Secondary | ICD-10-CM | POA: Insufficient documentation

## 2022-04-14 DIAGNOSIS — E7800 Pure hypercholesterolemia, unspecified: Secondary | ICD-10-CM | POA: Insufficient documentation

## 2022-04-14 DIAGNOSIS — K589 Irritable bowel syndrome without diarrhea: Secondary | ICD-10-CM | POA: Insufficient documentation

## 2022-04-14 DIAGNOSIS — Z803 Family history of malignant neoplasm of breast: Secondary | ICD-10-CM | POA: Diagnosis not present

## 2022-04-14 DIAGNOSIS — Z170 Estrogen receptor positive status [ER+]: Secondary | ICD-10-CM | POA: Insufficient documentation

## 2022-04-14 DIAGNOSIS — Z8041 Family history of malignant neoplasm of ovary: Secondary | ICD-10-CM | POA: Diagnosis not present

## 2022-04-14 LAB — GENETIC SCREENING ORDER

## 2022-04-14 NOTE — Progress Notes (Signed)
Location of Breast Cancer:  ?Invasive ductal carcinoma of left breast, estrogen receptor positive ? ?Histology per Pathology Report:  ?(Definitve pathology pending upcoming surgery) ?03/20/2022 ?Diagnosis ?Breast, left, needle core biopsy, 2 o'clock, 2cmfn ?- INVASIVE WELL DIFFERENTIATED DUCTAL CARCINOMA, GRADE 1 (2+2+1) ?- DUCTAL CARCINOMA IN SITU, LOW TO INTERMEDIATE NUCLEAR GRADE, CRIBRIFORM TYPE WITHOUT NECROSIS ? ?Receptor Status: ER(95%), PR (100%), Her2-neu (Negative via FISH), Ki-67(<5%) ? ?Did patient present with symptoms (if so, please note symptoms) or was this found on screening mammography?: Screening mammogram on 03/10/2022 found suspicious mass and distortion in the 2 o'clock region of the left ?breast. ? ?Past/Anticipated interventions by surgeon, if any:  ?04/22/2022 ?--Dr. Coralie Keens ?Scheduled for: LEFT BREAST LUMPECTOMY WITH RADIOACTIVE SEED AND SENTINEL LYMPH NODE BIOPSY ? ?Past/Anticipated interventions by medical oncology, if any:  ?Scheduled for consultation with Dr. [name] on 04/17/2022 ? ?Lymphedema issues, if any:  Patient denies   ? ?Pain issues, if any:  Patient denies  ? ?SAFETY ISSUES: ?Prior radiation? No ?Pacemaker/ICD? No ?Possible current pregnancy? No--LMP: 03/30/2022 ?Is the patient on methotrexate? No ? ?Current Complaints / other details:  Also meeting with genetic counselor today ?   ? ? ? ?

## 2022-04-14 NOTE — Progress Notes (Signed)
?Radiation Oncology         [phone] ?________________________________ ? ?Initial Outpatient Consultation ? ?Name: Caitlin [name] MRN: [id number]  ?Date: 04/15/2022  DOB: [birth year] ? ?LY:YTKPTWS, Maebelle Munroe, MD  Coralie Keens, MD  ? ?REFERRING PHYSICIAN: Coralie Keens, MD ? ?DIAGNOSIS:  ?  ICD-10-CM   ?1. Malignant neoplasm of upper-outer quadrant of left breast in female, estrogen receptor positive ([place])  C50.412   ? Z17.0   ?  ? ?cT1c N0 M0 Left Breast UOQ, Invasive ductal carcinoma with low-intermediate grade DCIS, ER+ / PR+ / Her2-, Grade 1 ? ?CHIEF COMPLAINT: Here to discuss management of left breast cancer ? ?HISTORY OF PRESENT ILLNESS::Caitlin [name] is a 51 y.o. female who presented with a left breast abnormality on the following imaging: bilateral screening mammogram on the date of 02/10/22.  No symptoms, if any, were reported at that time.   Diagnostic left breast mammogram and ultrasound on 03/10/22 further revealed an irregular suspicious hypoechoic mass in the left breast 2 o'clock position, 2 cmfn, measuring 1.1 x 0.8 x 0.5 cm. No evidence of left axillary adenopathy was appreciated.  ? ?Biopsy of the 2 o'clock left breast mass on date of 03/20/22 showed grade 1 invasive (well differentiated) ductal carcinoma measuring 9 mm, with low-intermediate grade DCIS.  ER status: 95% positive with moderate staining intensity; PR status 100% positive with strong staining intensity; Proliferation marker Ki67 at <5%; Her2 status negative; Grade 1. ? ?Accordingly, the patient was referred to Dr. [name] on 03/31/22 to discuss treatment options. Physical exam performed during this visit revealed no palpable mass or axillary adenopathy. Following discussion with Dr. [name], the patient has agreed to proceed with breast conserving surgery with SLN biopsies. Procedure is scheduled for 04/22/22.  ? ?PREVIOUS RADIATION THERAPY: No ? ?PAST MEDICAL HISTORY:  has a past medical history of Anxiety, Back  pain, Edema of both lower extremities, Elevated cholesterol, Endometriosis, Endometriosis, Family history of kidney cancer, Family history of ovarian cancer, Gallbladder problem, High cholesterol, History of IBS, IBS (irritable bowel syndrome), Infertility, female, and Vitamin D deficiency.   ? ?PAST SURGICAL HISTORY: ?Past Surgical History:  ?Procedure Laterality Date  ? CHOLECYSTECTOMY  2018  ? LAPAROSCOPY    ? ? ?FAMILY HISTORY: family history includes Anxiety disorder in her mother; COPD in her maternal grandmother; Cancer in her cousin; Depression in her mother; Diabetes in her maternal aunt; Heart disease in her father, maternal aunt, maternal grandfather, and maternal grandmother; Hypertension in her father; Kidney cancer (age of onset: 38) in her maternal aunt; Obesity in her mother; Ovarian cancer in her paternal grandmother; Rheum arthritis in her paternal aunt; Thyroid disease in her mother. ? ?SOCIAL HISTORY:  reports that she has never smoked. She has never used smokeless tobacco. She reports that she does not drink alcohol and does not use drugs. ? ?ALLERGIES: Ciprofloxacin and Sulfa antibiotics ? ?MEDICATIONS:  ?Current Outpatient Medications  ?Medication Sig Dispense Refill  ? furosemide (LASIX) 20 MG tablet Take 1 tablet (20 mg total) by mouth daily. 30 tablet 1  ? ?No current facility-administered medications for this encounter.  ? ? ?REVIEW OF SYSTEMS: As above in HPI. ?  ?PHYSICAL EXAM:  height is '5\' 5"'  (1.651 m) and weight is 229 lb 6.4 oz (104.1 kg). Her temperature is 97.9 ?F (36.6 ?C). Her blood pressure is 116/70 and her pulse is 94. Her respiration is 20 and oxygen saturation is 99%.   ?General: Alert and oriented, in no acute distress ?HEENT: Head  is normocephalic. Extraocular movements are intact.   ?Heart: Regular in rate and rhythm with no murmurs, rubs, or gallops. ?Chest: Clear to auscultation bilaterally, with no rhonchi, wheezes, or rales. ?Skin: No concerning  lesions. ?Musculoskeletal: symmetric strength and muscle tone throughout. ?Neurologic: Cranial nerves II through XII are grossly intact. No obvious focalities. Speech is fluent. Coordination is intact. ?Psychiatric: Judgment and insight are intact. Affect is appropriate. ?Breasts: No palpable masses appreciated in the breasts or axillae bilaterally. ? ? ?ECOG = 0 ? ?0 - Asymptomatic (Fully active, able to carry on all predisease activities without restriction) ? ?1 - Symptomatic but completely ambulatory (Restricted in physically strenuous activity but ambulatory and able to carry out work of a light or sedentary nature. For example, light housework, office work) ? ?2 - Symptomatic, <50% in bed during the day (Ambulatory and capable of all self care but unable to carry out any work activities. Up and about more than 50% of waking hours) ? ?3 - Symptomatic, >50% in bed, but not bedbound (Capable of only limited self-care, confined to bed or chair 50% or more of waking hours) ? ?4 - Bedbound (Completely disabled. Cannot carry on any self-care. Totally confined to bed or chair) ? ?5 - Death ? ? Oken MM, Creech RH, Tormey DC, et al. [phone]). "Toxicity and response criteria of the [facility] Group". [place] Oncol. 5 (6): 649-55 ? ? ?LABORATORY DATA:  ?Lab Results  ?Component Value Date  ? WBC 8.7 04/15/2022  ? HGB 14.0 04/15/2022  ? HCT 42.2 04/15/2022  ? MCV 90.6 04/15/2022  ? PLT 282 04/15/2022  ? ?CMP  ?   ?Component Value Date/Time  ? NA 138 04/15/2022 1318  ? K 3.6 04/15/2022 1318  ? CL 104 04/15/2022 1318  ? CO2 27 04/15/2022 1318  ? GLUCOSE 110 (H) 04/15/2022 1318  ? BUN 14 04/15/2022 1318  ? CREATININE 0.79 04/15/2022 1318  ? CALCIUM 8.8 (L) 04/15/2022 1318  ? PROT 5.8 (L) 12/03/2010 0415  ? ALBUMIN 4.0 09/06/2020 1044  ? AST 27 12/03/2010 0415  ? ALT 20 12/03/2010 0415  ? ALKPHOS 143 (H) 12/03/2010 0415  ? BILITOT 0.6 12/03/2010 0415  ? GFRNONAA >60 04/15/2022 1318  ? GFRAA  12/03/2010 0415   ?  >60        ?The eGFR has been calculated ?using the MDRD equation. ?This calculation has not been ?validated in all clinical ?situations. ?eGFR's persistently ?<60 mL/min signify ?possible Chronic Kidney Disease.  ? ?   ? ?  ?RADIOGRAPHY: MM CLIP PLACEMENT LEFT ? ?Result Date: 03/20/2022 ?CLINICAL DATA:  Status post ultrasound-guided biopsy left breast mass. EXAM: 3D DIAGNOSTIC LEFT MAMMOGRAM POST ULTRASOUND BIOPSY COMPARISON:  Previous exam(s). FINDINGS: 3D Mammographic images were obtained following ultrasound guided biopsy of left breast mass. The biopsy marking clip is in expected position at the site of biopsy. IMPRESSION: Appropriate positioning of the ribbon shaped biopsy marking clip at the site of biopsy in the upper-outer left breast. Final Assessment: Post Procedure Mammograms for Marker Placement Electronically Signed   By: Lovey Newcomer M.D.   On: 03/20/2022 09:19 ? ?[place] LT BREAST BX W LOC DEV 1ST LESION IMG BX [place] US GUIDE ? ?Addendum Date: 03/25/2022   ?ADDENDUM REPORT: 03/25/2022 16:23 ADDENDUM: Pathology revealed GRADE I INVASIVE WELL DIFFERENTIATED DUCTAL CARCINOMA, DUCTAL CARCINOMA IN SITU, LOW TO INTERMEDIATE NUCLEAR GRADE, CRIBRIFORM TYPE WITHOUT NECROSIS of the LEFT breast, 2 o'clock, 2cmfn, (ribbon clip). This was found to be concordant by  Dr. Lovey Newcomer. Pathology results were discussed with the patient by telephone. The patient reported doing well after the biopsy with minimal tenderness at the site. Post biopsy instructions and care were reviewed and questions were answered. The patient was encouraged to call [facility] for any additional concerns. My direct phone number was provided. Surgical consultation has been arranged with Dr. Nedra Hai at [facility] on March 31, 2022. Pathology results reported by Terie Purser, RN on 03/25/2022. Electronically Signed   By: Lovey Newcomer M.D.   On: 03/25/2022 16:23  ? ?Result Date: 03/25/2022 ?CLINICAL DATA:   Patient with indeterminate left breast mass 2 o'clock position. EXAM: ULTRASOUND GUIDED LEFT BREAST CORE NEEDLE BIOPSY COMPARISON:  Previous exam(s). PROCEDURE: I met with the patient and we discussed the procedure of ultras

## 2022-04-14 NOTE — Progress Notes (Signed)
Surgical Instructions ? ? ? Your procedure is scheduled on Tuesday, April 25th, 2023. ? ? Report to [facility] Main Entrance "A" at 06:00 A.M., then check in with the Admitting office. ? Call this number if you have problems the morning of surgery: ? [phone] ? ? If you have any questions prior to your surgery date call [phone]: Open Monday-Friday 8am-4pm ? ? ? Remember: ? Do not eat after midnight the night before your surgery ? ?You may drink clear liquids until 05:00 the morning of your surgery.   ?Clear liquids allowed are: Water, Non-Citrus Juices (without pulp), Carbonated Beverages, Clear Tea, Black Coffee ONLY (NO MILK, CREAM OR POWDERED CREAMER of any kind), and Gatorade ? ?Patient Instructions ? ?The night before surgery:  ?No food after midnight. ONLY clear liquids after midnight ? ?The day of surgery (if you do NOT have diabetes):  ?Drink ONE (1) Pre-Surgery Clear Ensure by 05:00 the morning of surgery. Drink in one sitting. Do not sip.  ?This drink was given to you during your hospital  ?pre-op appointment visit. ? ?Nothing else to drink after completing the  ?Pre-Surgery Clear Ensure. ? ? ?       If you have questions, please contact your surgeon?s office.  ?  ? Take these medicines the morning of surgery with A SIP OF WATER: NONE ? ? ?As of today, STOP taking any Aspirin (unless otherwise instructed by your surgeon) Aleve, Naproxen, Ibuprofen, Motrin, Advil, Goody's, BC's, all herbal medications, fish oil, and all vitamins. ? ? ? The day of surgery: ?         ?Do not wear jewelry or makeup ?Do not wear lotions, powders, perfumes, or deodorant. ?Do not shave 48 hours prior to surgery.   ?Do not bring valuables to the hospital. ?Do not wear nail polish, gel polish, artificial nails, or any other type of covering on natural nails (fingers and toes) ?If you have artificial nails or gel coating that need to be removed by a nail salon, please have this removed prior to surgery. Artificial nails or  gel coating may interfere with anesthesia's ability to adequately monitor your vital signs. ? ? ?[facility] is not responsible for any belongings or valuables. .  ? ?Do NOT Smoke (Tobacco/Vaping)  24 hours prior to your procedure ? ?If you use a CPAP at night, you may bring your mask for your overnight stay. ?  ?Contacts, glasses, hearing aids, dentures or partials may not be worn into surgery, please bring cases for these belongings ?  ?For patients admitted to the hospital, discharge time will be determined by your treatment team. ?  ?Patients discharged the day of surgery will not be allowed to drive home, and someone needs to stay with them for 24 hours. ? ? ?SURGICAL WAITING ROOM VISITATION ?Patients having surgery or a procedure in a hospital may have two support people. ?Children under the age of 46 must have an adult with them who is not the patient. ?They may stay in the waiting area during the procedure and may switch out with other visitors. If the patient needs to stay at the hospital during part of their recovery, the visitor guidelines for inpatient rooms apply. ? ?Please refer to the [facility] website for the visitor guidelines for Inpatients (after your surgery is over and you are in a regular room).  ? ? ? ?Special instructions:   ? ?Oral Hygiene is also important to reduce your risk of infection.  Remember - BRUSH  YOUR TEETH THE MORNING OF SURGERY WITH YOUR REGULAR TOOTHPASTE ? ? ?[facility]- Preparing For Surgery ? ?Before surgery, you can play an important role. Because skin is not sterile, your skin needs to be as free of germs as possible. You can reduce the number of germs on your skin by washing with CHG (chlorahexidine gluconate) Soap before surgery.  CHG is an antiseptic cleaner which kills germs and bonds with the skin to continue killing germs even after washing.   ? ? ?Please do not use if you have an allergy to CHG or antibacterial soaps. If your skin becomes reddened/irritated stop  using the CHG.  ?Do not shave (including legs and underarms) for at least 48 hours prior to first CHG shower. It is OK to shave your face. ? ?Please follow these instructions carefully. ?  ? ? Shower the NIGHT BEFORE SURGERY and the MORNING OF SURGERY with CHG Soap.  ? If you chose to wash your hair, wash your hair first as usual with your normal shampoo. After you shampoo, rinse your hair and body thoroughly to remove the shampoo.  Then [employer] and genitals (private parts) with your normal soap and rinse thoroughly to remove soap. ? ?After that Use CHG Soap as you would any other liquid soap. You can apply CHG directly to the skin and wash gently with a scrungie or a clean washcloth.  ? ?Apply the CHG Soap to your body ONLY FROM THE NECK DOWN.  Do not use on open wounds or open sores. Avoid contact with your eyes, ears, mouth and genitals (private parts). Wash Face and genitals (private parts)  with your normal soap.  ? ?Wash thoroughly, paying special attention to the area where your surgery will be performed. ? ?Thoroughly rinse your body with warm water from the neck down. ? ?DO NOT shower/wash with your normal soap after using and rinsing off the CHG Soap. ? ?Pat yourself dry with a CLEAN TOWEL. ? ?Wear CLEAN PAJAMAS to bed the night before surgery ? ?Place CLEAN SHEETS on your bed the night before your surgery ? ?DO NOT SLEEP WITH PETS. ? ? ?Day of Surgery: ? ?Take a shower with CHG soap. ?Wear Clean/Comfortable clothing the morning of surgery ?Do not apply any deodorants/lotions.   ?Remember to brush your teeth WITH YOUR REGULAR TOOTHPASTE. ? ? ? ?If you received a COVID test during your pre-op visit  it is requested that you wear a mask when out in public, stay away from anyone that may not be feeling well and notify your surgeon if you develop symptoms. If you have been in contact with anyone that has tested positive in the last 10 days please notify you surgeon. ? ?  ?Please read over the following fact  sheets that you were given.   ?

## 2022-04-15 ENCOUNTER — Ambulatory Visit
Admission: RE | Admit: 2022-04-15 | Discharge: 2022-04-15 | Disposition: A | Discharge status: Home / Self Care | Payer: BC Managed Care – PPO | Source: Ambulatory Visit | Attending: Radiation Oncology | Admitting: Radiation Oncology

## 2022-04-15 ENCOUNTER — Encounter (HOSPITAL_COMMUNITY): Payer: Self-pay

## 2022-04-15 ENCOUNTER — Inpatient Hospital Stay (HOSPITAL_BASED_OUTPATIENT_CLINIC_OR_DEPARTMENT_OTHER): Payer: BC Managed Care – PPO | Admitting: Genetic Counselor

## 2022-04-15 ENCOUNTER — Encounter (HOSPITAL_COMMUNITY)
Admission: RE | Admit: 2022-04-15 | Discharge: 2022-04-15 | Disposition: A | Discharge status: Home / Self Care | Payer: BC Managed Care – PPO | Source: Ambulatory Visit | Attending: Surgery | Admitting: Surgery

## 2022-04-15 ENCOUNTER — Encounter: Payer: Self-pay | Admitting: Genetic Counselor

## 2022-04-15 ENCOUNTER — Encounter: Payer: Self-pay | Admitting: Radiation Oncology

## 2022-04-15 ENCOUNTER — Other Ambulatory Visit: Payer: Self-pay

## 2022-04-15 VITALS — BP 116/70 | HR 94 | Temp 97.9°F | Resp 20 | Ht 65.0 in | Wt 229.4 lb

## 2022-04-15 VITALS — BP 128/81 | HR 91 | Temp 98.5°F | Resp 19 | Ht 65.0 in | Wt 228.0 lb

## 2022-04-15 DIAGNOSIS — Z170 Estrogen receptor positive status [ER+]: Secondary | ICD-10-CM

## 2022-04-15 DIAGNOSIS — Z8051 Family history of malignant neoplasm of kidney: Secondary | ICD-10-CM | POA: Insufficient documentation

## 2022-04-15 DIAGNOSIS — Z01812 Encounter for preprocedural laboratory examination: Secondary | ICD-10-CM | POA: Insufficient documentation

## 2022-04-15 DIAGNOSIS — E7800 Pure hypercholesterolemia, unspecified: Secondary | ICD-10-CM | POA: Diagnosis not present

## 2022-04-15 DIAGNOSIS — Z8041 Family history of malignant neoplasm of ovary: Secondary | ICD-10-CM

## 2022-04-15 DIAGNOSIS — E559 Vitamin D deficiency, unspecified: Secondary | ICD-10-CM | POA: Insufficient documentation

## 2022-04-15 DIAGNOSIS — Z01818 Encounter for other preprocedural examination: Secondary | ICD-10-CM

## 2022-04-15 DIAGNOSIS — C50412 Malignant neoplasm of upper-outer quadrant of left female breast: Secondary | ICD-10-CM | POA: Diagnosis not present

## 2022-04-15 LAB — BASIC METABOLIC PANEL
Anion gap: 7 (ref 5–15)
BUN: 14 mg/dL (ref 6–20)
CO2: 27 mmol/L (ref 22–32)
Calcium: 8.8 mg/dL — ABNORMAL LOW (ref 8.9–10.3)
Chloride: 104 mmol/L (ref 98–111)
Creatinine, Ser: 0.79 mg/dL (ref 0.44–1.00)
GFR, Estimated: >60 mL/min (ref >60)
Glucose, Bld: 110 mg/dL — ABNORMAL HIGH (ref 70–99)
Potassium: 3.6 mmol/L (ref 3.5–5.1)
Sodium: 138 mmol/L (ref 135–145)

## 2022-04-15 LAB — CBC
HCT: 42.2 % (ref 36.0–46.0)
Hemoglobin: 14 g/dL (ref 12.0–15.0)
MCH: 30 pg (ref 26.0–34.0)
MCHC: 33.2 g/dL (ref 30.0–36.0)
MCV: 90.6 fL (ref 80.0–100.0)
Platelets: 282 10*3/uL (ref 150–400)
RBC: 4.66 MIL/uL (ref 3.87–5.11)
RDW: 12.9 % (ref 11.5–15.5)
WBC: 8.7 10*3/uL (ref 4.0–10.5)
nRBC: 0 % (ref 0.0–0.2)

## 2022-04-15 LAB — PREGNANCY, URINE: Preg Test, Ur: NEGATIVE

## 2022-04-15 NOTE — Progress Notes (Signed)
REFERRING PROVIDER: ?Coralie Keens, MD ?[address] ?[address] ?[place],  [place] [address] ? ?PRIMARY PROVIDER:  ?[name], MD ? ?PRIMARY REASON FOR VISIT:  ?1. Family history of ovarian cancer   ?2. Family history of kidney cancer   ?3. Malignant neoplasm of upper-outer quadrant of left breast in female, estrogen receptor positive ([place])   ? ? ? ?HISTORY OF PRESENT ILLNESS:   ?Ms. Lim, a 51 y.o. female, was seen for a [place] cancer genetics consultation at the request of Dr. [name] due to a personal and family history of cancer.  Ms. Garciagarcia presents to clinic today to discuss the possibility of a hereditary predisposition to cancer, genetic testing, and to further clarify her future cancer risks, as well as potential cancer risks for family members.  ? ?In April 2023, at the age of 43, Ms. Pickford was diagnosed with cancer of the left breast. The treatment plan includes lumpectomy, which is scheduled on April 22, 2022..  ? ? ?CANCER HISTORY:  ?Oncology History  ? No history exists.  ? ? ? ?RISK FACTORS:  ?Menarche was at age 44.  ?First live birth at age 38.  ?OCP use for approximately  7-10  years.  ?Ovaries intact: yes.  ?Hysterectomy: no.  ?Menopausal status: premenopausal.  ?HRT use: 0 years. ?Colonoscopy: yes; normal. ?Mammogram within the last year: yes. ?Number of breast biopsies: 1. ?Up to date with pelvic exams: yes. ?Any excessive radiation exposure in the past: no ? ?Past Medical History:  ?Diagnosis Date  ? Anxiety   ? Back pain   ? Edema of both lower extremities   ? Elevated cholesterol   ? Endometriosis   ? Endometriosis   ? Family history of kidney cancer   ? Family history of ovarian cancer   ? Gallbladder problem   ? High cholesterol   ? History of IBS   ? IBS (irritable bowel syndrome)   ? Infertility, female   ? Vitamin D deficiency   ? ? ?Past Surgical History:  ?Procedure Laterality Date  ? CHOLECYSTECTOMY  2018  ? LAPAROSCOPY    ? ? ?Social History  ? ?Socioeconomic History   ? Marital status: Married  ?  Spouse name: Gerald Stabs  ? Number of children: 3  ? Years of education: Not on file  ? Highest education level: Not on file  ?Occupational History  ? Occupation: English as a second language teacher  ?Tobacco Use  ? Smoking status: Never  ? Smokeless tobacco: Never  ?Vaping Use  ? Vaping Use: Never used  ?Substance and Sexual Activity  ? Alcohol use: No  ? Drug use: No  ? Sexual activity: Yes  ?Other Topics Concern  ? Not on file  ?Social History Narrative  ? Right Handed  ? Two Story Home  ? Drinks Caffeine Occasionally   ? ?Social Determinants of Health  ? ?Financial Resource Strain: Not on file  ?Food Insecurity: Not on file  ?Transportation Needs: Not on file  ?Physical Activity: Not on file  ?Stress: Not on file  ?Social Connections: Not on file  ?  ? ?FAMILY HISTORY:  ?We obtained a detailed, 4-generation family history.  Significant diagnoses are listed below: ?Family History  ?Problem Relation Age of Onset  ? Thyroid disease Mother   ? Depression Mother   ? Anxiety disorder Mother   ? Obesity Mother   ? Hypertension Father   ? Heart disease Father   ? Heart disease Maternal Aunt   ? Diabetes Maternal Aunt   ?  Kidney cancer Maternal Aunt 72  ? Rheum arthritis Paternal Aunt   ? Heart disease Maternal Grandmother   ? COPD Maternal Grandmother   ? Heart disease Maternal Grandfather   ? Ovarian cancer Paternal Grandmother   ?     dx in her 59s  ? Cancer Cousin   ?     unknown cancer in mat first cousin  ? ? ? ?The patient has two sons and a daughter who are cancer free.  She has one sister who is cancer free.  Her mother is living and father is deceased. ? ?The patient's mother is currently cancer free.  She had full two sisters, one who had kidney cancer and the other had a son with an unknown cancer.  She also had a paternal half brother who had a brain tumor.  The maternal grandparents are deceased from heart disease. ? ?The patient's father died of kidney disease. He had two sisters who were cancer free.  His  mother died of ovarian cancer and his father died of old age. ? ?Ms. Kutscher is unaware of previous family history of genetic testing for hereditary cancer risks. Patient's maternal ancestors are of [place] descent, and paternal ancestors are of [place] and [place] descent. There is no reported Ashkenazi Jewish ancestry. There is no known consanguinity. ? ?GENETIC COUNSELING ASSESSMENT: Ms. Brickell is a 51 y.o. female with a personal and family history of cancer which is somewhat suggestive of a hereditary cancer syndrome and predisposition to cancer given her young age of onset and the combination of cancer. We, therefore, discussed and recommended the following at today's visit.  ? ?DISCUSSION: We discussed that, in general, most cancer is not inherited in families, but instead is sporadic or familial. Sporadic cancers occur by chance and typically happen at older ages (>50 years) as this type of cancer is caused by genetic changes acquired during an individual?s lifetime. Some families have more cancers than would be expected by chance; however, the ages or types of cancer are not consistent with a known genetic mutation or known genetic mutations have been ruled out. This type of familial cancer is thought to be due to a combination of multiple genetic, environmental, hormonal, and lifestyle factors. While this combination of factors likely increases the risk of cancer, the exact source of this risk is not currently identifiable or testable. ? ?We discussed that 5 - 10% of breast cancer is hereditary, with most cases associated with BRCA mutations.  There are other genes that can be associated with hereditary breast cancer syndromes.  These include ATM, CHEK2 and PALB2.  We discussed that testing is beneficial for several reasons including knowing how to follow individuals after completing their treatment, identifying whether potential treatment options such as PARP inhibitors would be  beneficial, and understand if other family members could be at risk for cancer and allow them to undergo genetic testing.  ? ?We reviewed the characteristics, features and inheritance patterns of hereditary cancer syndromes. We also discussed genetic testing, including the appropriate family members to test, the process of testing, insurance coverage and turn-around-time for results. We discussed the implications of a negative, positive and/or variant of uncertain significant result. In order to get genetic test results in a timely manner so that Ms. Wegener can use these genetic test results for surgical decisions, we recommended Ms. Platner pursue genetic testing for the [facility] panel. Once complete, we recommend Ms. Starleen Blue pursue reflex genetic testing to the CancerNext-Expanded+RNAinsight gene panel.  ? ?  The CancerNext-Expanded gene panel offered by [facility] and includes sequencing and rearrangement analysis for the following 77 genes: AIP, ALK, APC*, ATM*, AXIN2, BAP1, BARD1, BLM, BMPR1A, BRCA1*, BRCA2*, BRIP1*, CDC73, CDH1*, CDK4, CDKN1B, CDKN2A, CHEK2*, CTNNA1, DICER1, FANCC, FH, FLCN, GALNT12, KIF1B, LZTR1, MAX, MEN1, MET, MLH1*, MSH2*, MSH3, MSH6*, MUTYH*, NBN, NF1*, NF2, NTHL1, PALB2*, PHOX2B, PMS2*, POT1, PRKAR1A, PTCH1, PTEN*, RAD51C*, RAD51D*, RB1, RECQL, RET, SDHA, SDHAF2, SDHB, SDHC, SDHD, SMAD4, SMARCA4, SMARCB1, SMARCE1, STK11, SUFU, TMEM127, TP53*, TSC1, TSC2, VHL and XRCC2 (sequencing and deletion/duplication); EGFR, EGLN1, HOXB13, KIT, MITF, PDGFRA, POLD1, and POLE (sequencing only); EPCAM and GREM1 (deletion/duplication only). DNA and RNA analyses performed for * genes.  ? ?Based on Ms. Christoph's personal and family history of cancer, she meets medical criteria for genetic testing. Despite that she meets criteria, she may still have an out of pocket cost.  ? ?PLAN: After considering the risks, benefits, and limitations, Ms. Starleen Blue provided informed consent to pursue genetic testing and the  blood sample was sent to [facility] for analysis of the [facility] and CancerNext-Expanded+RNAinsight panels. Results should be available within approximately 2-3 weeks' time, at which point they

## 2022-04-15 NOTE — Progress Notes (Signed)
PCP - Horald Pollen, MD ?Cardiologist - denies ? ?PPM/ICD - denies ?Device Orders - n/a ?Rep Notified - n/a ? ?Chest x-ray - n/a ?EKG - n/a ?Stress Test - denies ?ECHO - denies ?Cardiac Cath - denies ? ?Sleep Study - denies ?CPAP - n/a ? ?Fasting Blood Sugar - n/a ? ?Blood Thinner Instructions: n/a ? ?Aspirin Instructions: Patient was instructed: As of today, STOP taking any Aspirin (unless otherwise instructed by your surgeon) Aleve, Naproxen, Ibuprofen, Motrin, Advil, Goody's, BC's, all herbal medications, fish oil, and all vitamins. ? ?ERAS Protcol - yes ?PRE-SURGERY Ensure - yes, until 05:00 o'clock ? ?COVID TEST- n/a ? ?Anesthesia review: yes - scheduled for radioactive seed implantation ? ?Patient denies shortness of breath, fever, cough and chest pain at PAT appointment ? ? ?All instructions explained to the patient, with a verbal understanding of the material. Patient agrees to go over the instructions while at home for a better understanding. Patient also instructed to self quarantine after being tested for COVID-19. The opportunity to ask questions was provided. ?  ?

## 2022-04-17 ENCOUNTER — Inpatient Hospital Stay (HOSPITAL_BASED_OUTPATIENT_CLINIC_OR_DEPARTMENT_OTHER): Payer: BC Managed Care – PPO | Admitting: Hematology and Oncology

## 2022-04-17 ENCOUNTER — Encounter: Payer: Self-pay | Admitting: Hematology and Oncology

## 2022-04-17 ENCOUNTER — Other Ambulatory Visit: Payer: Self-pay

## 2022-04-17 ENCOUNTER — Encounter: Payer: Self-pay | Admitting: *Deleted

## 2022-04-17 ENCOUNTER — Telehealth: Payer: Self-pay | Admitting: *Deleted

## 2022-04-17 ENCOUNTER — Inpatient Hospital Stay: Payer: BC Managed Care – PPO

## 2022-04-17 DIAGNOSIS — C50412 Malignant neoplasm of upper-outer quadrant of left female breast: Secondary | ICD-10-CM

## 2022-04-17 DIAGNOSIS — E7800 Pure hypercholesterolemia, unspecified: Secondary | ICD-10-CM | POA: Diagnosis not present

## 2022-04-17 DIAGNOSIS — Z170 Estrogen receptor positive status [ER+]: Secondary | ICD-10-CM | POA: Diagnosis not present

## 2022-04-17 DIAGNOSIS — Z8051 Family history of malignant neoplasm of kidney: Secondary | ICD-10-CM | POA: Diagnosis not present

## 2022-04-17 DIAGNOSIS — K589 Irritable bowel syndrome without diarrhea: Secondary | ICD-10-CM | POA: Diagnosis not present

## 2022-04-17 DIAGNOSIS — C50912 Malignant neoplasm of unspecified site of left female breast: Secondary | ICD-10-CM | POA: Diagnosis not present

## 2022-04-17 DIAGNOSIS — G932 Benign intracranial hypertension: Secondary | ICD-10-CM | POA: Diagnosis not present

## 2022-04-17 NOTE — Progress Notes (Signed)
[place] ?CONSULT NOTE ? ?Patient Care Team: ?[name], MD as PCP - General (Family Medicine) ?Mauro Kaufmann, RN as Oncology Nurse Navigator ?Rockwell Germany, RN as Oncology Nurse Navigator ? ?CHIEF COMPLAINTS/PURPOSE OF CONSULTATION:  ?Newly diagnosed breast cancer ? ?HISTORY OF PRESENTING ILLNESS:  ?Caitlin [name] 51 y.o. female is here because of recent diagnosis of left breast cancer ? ? ? ?I reviewed her records extensively and collaborated the history with the patient. ? ?SUMMARY OF ONCOLOGIC HISTORY: ?Oncology History  ?Malignant neoplasm of upper-outer quadrant of left breast in female, estrogen receptor positive ([place])  ?03/10/2022 Mammogram  ? Mammogram of the left breast showed irregular hypoechoic mass in the left breast.  Targeted ultrasound performed also confirmed an irregular hypoechoic mass in the left breast at 2:00 2 cm from the nipple measuring 1.1 x 0.8 x 0.5 cm.  Left axilla did not show any enlarged lymphadenopathy. ?  ?03/20/2022 Pathology Results  ? 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  Prognostic showed ER 95% positive moderate staining.  PR 100% positive strong staining.  HER2 equivocal by IHC.  FISH negative ? ?  ?04/02/2022 Initial Diagnosis  ? Malignant neoplasm of upper-outer quadrant of left breast in female, estrogen receptor positive ([place]) ?  ? ? ?Ms. Wendel is here for an initial visit with her husband.  She is doing quite well.  She has past medical history significant for endometriosis otherwise is very healthy.  She is now diagnosed with pseudotumor cerebri and is currently on diuretics.  She is scheduled for lumpectomy by Dr. [name] on 04/22/2022.  Rest of the pertinent 10 point ROS reviewed and negative ? ? ?MEDICAL HISTORY:  ?Past Medical History:  ?Diagnosis Date  ? Anxiety   ? Back pain   ? Edema of both lower extremities   ? Elevated  cholesterol   ? Endometriosis   ? Endometriosis   ? Family history of kidney cancer   ? Family history of ovarian cancer   ? Gallbladder problem   ? High cholesterol   ? History of IBS   ? IBS (irritable bowel syndrome)   ? Infertility, female   ? Vitamin D deficiency   ? ? ?SURGICAL HISTORY: ?Past Surgical History:  ?Procedure Laterality Date  ? CHOLECYSTECTOMY  2018  ? LAPAROSCOPY    ? ? ?SOCIAL HISTORY: ?Social History  ? ?Socioeconomic History  ? Marital status: Married  ?  Spouse name: Gerald Stabs  ? Number of children: 3  ? Years of education: Not on file  ? Highest education level: Not on file  ?Occupational History  ? Occupation: English as a second language teacher  ?Tobacco Use  ? Smoking status: Never  ? Smokeless tobacco: Never  ?Vaping Use  ? Vaping Use: Never used  ?Substance and Sexual Activity  ? Alcohol use: No  ? Drug use: No  ? Sexual activity: Yes  ?Other Topics Concern  ? Not on file  ?Social History Narrative  ? Right Handed  ? Two Story Home  ? Drinks Caffeine Occasionally   ? ?Social Determinants of Health  ? ?Financial Resource Strain: Not on file  ?Food Insecurity: Not on file  ?Transportation Needs: Not on file  ?Physical Activity: Not on file  ?Stress: Not on file  ?Social Connections: Not on file  ?Intimate Partner Violence: Not on file  ? ? ?FAMILY HISTORY: ?Family History  ?Problem Relation Age of Onset  ?  Thyroid disease Mother   ? Depression Mother   ? Anxiety disorder Mother   ? Obesity Mother   ? Hypertension Father   ? Heart disease Father   ? Heart disease Maternal Aunt   ? Diabetes Maternal Aunt   ? Kidney cancer Maternal Aunt 72  ? Rheum arthritis Paternal Aunt   ? Heart disease Maternal Grandmother   ? COPD Maternal Grandmother   ? Heart disease Maternal Grandfather   ? Ovarian cancer Paternal Grandmother   ?     dx in her 39s  ? Cancer Cousin   ?     unknown cancer in mat first cousin  ? ? ?ALLERGIES:  is allergic to ciprofloxacin and sulfa antibiotics. ? ?MEDICATIONS:  ?Current Outpatient Medications   ?Medication Sig Dispense Refill  ? furosemide (LASIX) 20 MG tablet Take 1 tablet (20 mg total) by mouth daily. 30 tablet 1  ? ?No current facility-administered medications for this visit.  ? ? ?REVIEW OF SYSTEMS:   ?Constitutional: Denies fevers, chills or abnormal night sweats ?Eyes: Denies blurriness of vision, double vision or watery eyes ?Ears, nose, mouth, throat, and face: Denies mucositis or sore throat ?Respiratory: Denies cough, dyspnea or wheezes ?Cardiovascular: Denies palpitation, chest discomfort or lower extremity swelling ?Gastrointestinal:  Denies nausea, heartburn or change in bowel habits ?Skin: Denies abnormal skin rashes ?Lymphatics: Denies new lymphadenopathy or easy bruising ?Neurological:Denies numbness, tingling or new weaknesses ?Behavioral/Psych: Mood is stable, no new changes  ?Breast: Denies any palpable lumps or discharge ?All other systems were reviewed with the patient and are negative. ? ?PHYSICAL EXAMINATION: ?ECOG PERFORMANCE STATUS: 0 - Asymptomatic ? ?Vitals:  ? 04/17/22 0954  ?BP: 129/82  ?Pulse: 84  ?Resp: 16  ?Temp: 98.1 ?F (36.7 ?C)  ?SpO2: 100%  ? ?Filed Weights  ? 04/17/22 0954  ?Weight: 228 lb 9.6 oz (103.7 kg)  ? ? ?GENERAL:alert, no distress and comfortable ?SKIN: skin color, texture, turgor are normal, no rashes or significant lesions ?EYES: normal, conjunctiva are pink and non-injected, sclera clear ?OROPHARYNX:no exudate, no erythema and lips, buccal mucosa, and tongue normal  ?NECK: supple, thyroid normal size, non-tender, without nodularity ?LYMPH:  no palpable lymphadenopathy in the cervical, axillary or inguinal ?LUNGS: clear to auscultation and percussion with normal breathing effort ?HEART: regular rate & rhythm and no murmurs and no lower extremity edema ?ABDOMEN:abdomen soft, non-tender and normal bowel sounds ?Musculoskeletal:no cyanosis of digits and no clubbing  ?PSYCH: alert & oriented x 3 with fluent speech ?NEURO: no focal motor/sensory deficits ?BREAST:  No palpable nodules in the breast.  No palpable regional adenopathy. ? ?LABORATORY DATA:  ?I have reviewed the data as listed ?Lab Results  ?Component Value Date  ? WBC 8.7 04/15/2022  ? HGB 14.0 04/15/2022  ? HCT 42.2 04/15/2022  ? MCV 90.6 04/15/2022  ? PLT 282 04/15/2022  ? ?Lab Results  ?Component Value Date  ? NA 138 04/15/2022  ? K 3.6 04/15/2022  ? CL 104 04/15/2022  ? CO2 27 04/15/2022  ? ? ?RADIOGRAPHIC STUDIES: ?I have personally reviewed the radiological reports and agreed with the findings in the report. ? ?ASSESSMENT AND PLAN:  ?No problem-specific Assessment & Plan notes found for this encounter. ? ? ?All questions were answered. The patient knows to call the clinic with any problems, questions or concerns. ?  ? [name], MD ?04/17/22 ? ?

## 2022-04-17 NOTE — Assessment & Plan Note (Signed)
This is a very pleasant 51 year old premenopausal female patient with newly diagnosed left breast invasive ductal carcinoma, ER/PR positive, HER2 negative referred to medical oncology for recommendations. ?She is scheduled for lumpectomy Dr. [name] on 04/22/2022.  I agree with upfront surgery given low-grade and strong ER/PR positive tumor.  Following surgery we will proceed with Oncotype testing.  We have discussed the following details about Oncotype ? ?We have discussed about Oncotype Dx score which is a well validated prognostic scoring system which can predict outcome with endocrine therapy alone and whether chemotherapy reduces recurrence.  Typically in patients with ER positive cancers that are node negative if the RS score is high typically greater than or equal to 26, chemotherapy is recommended.  ?In women with intermediate recurrence score younger than 55, there can still be some role for chemotherapy in addition to endocrine therapy especially if the recurrence score is between 21-25. ?If chemotherapy is needed, this will precede radiation and then after radiation she will continue on antiestrogen therapy. ? ?She most likely will not need adjuvant chemotherapy based on the tumor characteristics so far.  She understands that her age could be a high risk factor.  If she does not need adjuvant chemotherapy then she will proceed with adjuvant radiation followed by antiestrogen therapy.  We have discussed the following details about tamoxifen for antiestrogen therapy since she is premenopausal ? ?We have discussed options for antiestrogen therapy today ? ?With regards to Tamoxifen, we discussed that this is a SERM, selective estrogen receptor modulator. We discussed mechanism of action of Tamoxifen, adverse effects on Tamoxifen including but not limited to post menopausal symptoms, increased risk of DVT/PE, increased risk of endometrial cancer, questionable cataracts with long term use and increased risk of  cardiovascular events in the study which was not statistically significant. A benefit from Tamoxifen would be improvement in bone density. ? ?She is agreeable to all these recommendations.  Thank you for consulting [place] the care of this patient.  Please not hesitate to contact us with any additional questions or concerns. ?

## 2022-04-17 NOTE — Telephone Encounter (Signed)
Left vm regarding navigation resources and contact information. Request return call for questions or needs. ?

## 2022-04-18 ENCOUNTER — Telehealth: Payer: Self-pay | Admitting: *Deleted

## 2022-04-18 NOTE — Telephone Encounter (Signed)
Pt left vm regarding FMLA paperwork. Return pt call, informed Dr. Trevor Mace office will be the one to fill out as sx is first. Contact information provided for further questions or needs. ?

## 2022-04-21 ENCOUNTER — Ambulatory Visit
Admission: RE | Admit: 2022-04-21 | Discharge: 2022-04-21 | Disposition: A | Discharge status: Home / Self Care | Payer: BC Managed Care – PPO | Source: Ambulatory Visit | Attending: Surgery | Admitting: Surgery

## 2022-04-21 DIAGNOSIS — C50912 Malignant neoplasm of unspecified site of left female breast: Secondary | ICD-10-CM | POA: Diagnosis not present

## 2022-04-21 DIAGNOSIS — Z853 Personal history of malignant neoplasm of breast: Secondary | ICD-10-CM

## 2022-04-21 NOTE — H&P (Signed)
?REFERRING PHYSICIAN: Delsa Bern, MD ? ?PROVIDER: Beverlee Nims, MD ? ?MRN: [id number] ?DOB: [birth year] ?DATE OF ENCOUNTER: 03/31/2022 ?Subjective  ? ?Chief Complaint: Newly diagnosed left breast cancer ? ?History of Present Illness: ?Caitlin [name] is a 51 y.o. female who is seen today as an office consultation for evaluation of left breast cancer.  ? ?This is a 51 year old female was found to have a small abnormality in the left breast on screening mammography. It measured 1.1 x 0.8 x 0.5 cm by ultrasound. Ultrasound the axilla was unremarkable. She underwent a biopsy of the mass showing an invasive ductal carcinoma. It was 95% ER positive, 100% PR positive, HER2 negative, and had a Ki-67 of less than 5%. She has had no previous problems regarding her breast. There is only a remote family history of breast cancer as well as ovarian cancer. She is otherwise healthy without complaints. She has no cardiopulmonary issues. She has no previous problems with general anesthesia ? ?Review of Systems: ?A complete review of systems was obtained from the patient. I have reviewed this information and discussed as appropriate with the patient. See HPI as well for other ROS. ? ?ROS  ? ?Medical History: ?Past Medical History:  ?Diagnosis Date  ? Anxiety  ? Hyperlipidemia  ? ?There is no problem list on file for this patient. ? ?Past Surgical History:  ?Procedure Laterality Date  ? CHOLECYSTECTOMY  ? ENDOMETRIAL ABLATION W/ NOVASURE  ? ? ?Allergies  ?Allergen Reactions  ? Ciprofloxacin Other (See Comments)  ?"achy joints" ?"achy joints" ?"achy joints" ?"achy joints" ? ? Sulfa (Sulfonamide Antibiotics) Rash  ? ?Current Outpatient Medications on File Prior to Visit  ?Medication Sig Dispense Refill  ? FUROsemide (LASIX) 20 MG tablet Take 20 mg by mouth once daily  ? ?No current facility-administered medications on file prior to visit.  ? ?Family History  ?Problem Relation Age of Onset  ? Skin cancer Mother  ? Obesity Mother   ? Hyperlipidemia (Elevated cholesterol) Mother  ? Skin cancer Father  ? High blood pressure (Hypertension) Father  ? Hyperlipidemia (Elevated cholesterol) Father  ? Deep vein thrombosis (DVT or abnormal blood clot formation) Father  ? Obesity Brother  ? ? ?Social History  ? ?Tobacco Use  ?Smoking Status Every Day  ? Types: Cigarettes  ?Smokeless Tobacco Never  ? ? ?Social History  ? ?Socioeconomic History  ? Marital status: Married  ?Tobacco Use  ? Smoking status: Every Day  ?Types: Cigarettes  ? Smokeless tobacco: Never  ?Substance and Sexual Activity  ? Alcohol use: Never  ? Drug use: Never  ? ?Objective:  ? ?Vitals:  ?03/31/22 0935  ?BP: 126/80  ?Pulse: 97  ?Temp: 36.8 ?C (98.2 ?F)  ?SpO2: 98%  ?Weight: (!) 103.7 kg (228 lb 9.6 oz)  ?Height: 165.1 cm (_0 )  ? ?Body mass index is 38.04 kg/m?. ? ?Physical Exam  ? ?She appears well on exam ? ?There is ecchymosis from the biopsy of the left breast with small hematoma but no palpable mass. The nipple areolar complex is otherwise normal. ?There is no axillary adenopathy ? ?Labs, Imaging and Diagnostic Testing: ?I have reviewed her mammograms, ultrasound, and pathology results ? ?Assessment and Plan:  ? ?Diagnoses and all orders for this visit: ? ?Invasive ductal carcinoma of breast, left (CMS-HCC) ?- Ambulatory Referral to Oncology-Medical ?- Ambulatory Referral to Radiation Oncology ?- Ambulatory Referral to Physical Therapy ?- Ambulatory Referral to Genetics ? ? ? ?At this point, I had a long discussion with the patient  and her husband regarding breast cancer in general. We discussed that this is a multi disciplinary approach to breast cancer. From a surgical standpoint I then discussed breast conservation versus mastectomy. She is interested in breast conservation. I then discussed proceeding with a radioactive seed guided left breast lumpectomy and sentinel node biopsy. I explained the surgical procedure in detail. We discussed the risks which includes but is  not limited to bleeding, infection, injury to surrounding structures, the need for further surgery if margins or nodes are positive, postoperative recovery, etc. I gave him a copy of the pathology results. We discussed that she will be referred to medical oncology, radiation oncology, genetics, and physical therapy preoperatively but surgery will be the frontline of her treatment. They understand and agree with the plans.  ?

## 2022-04-21 NOTE — Anesthesia Preprocedure Evaluation (Addendum)
Anesthesia Evaluation  ?Patient identified by MRN, date of birth, ID band ?Patient awake ? ? ? ?Reviewed: ?Allergy & Precautions, NPO status , Patient's Chart, lab work & pertinent test results ? ?Airway ?Mallampati: III ? ?TM Distance: >3 FB ?Neck ROM: Full ? ? ? Dental ?no notable dental hx. ?(+) Teeth Intact, Dental Advisory Given ?  ?Pulmonary ?neg pulmonary ROS,  ?  ?Pulmonary exam normal ?breath sounds clear to auscultation ? ? ? ? ? ? Cardiovascular ?negative cardio ROS ?Normal cardiovascular exam ?Rhythm:Regular Rate:Normal ? ?HLD ?  ?Neuro/Psych ?PSYCHIATRIC DISORDERS Anxiety negative neurological ROS ?   ? GI/Hepatic ?negative GI ROS, Neg liver ROS,   ?Endo/Other  ?negative endocrine ROS ? Renal/GU ?negative Renal ROS  ?negative genitourinary ?  ?Musculoskeletal ?negative musculoskeletal ROS ?(+)  ? Abdominal ?  ?Peds ? Hematology ?negative hematology ROS ?(+)   ?Anesthesia Other Findings ? ? Reproductive/Obstetrics ? ?  ? ? ? ? ? ? ? ? ? ? ? ? ? ?  ?  ? ? ? ? ? ? ? ?Anesthesia Physical ?Anesthesia Plan ? ?ASA: 2 ? ?Anesthesia Plan: General  ? ?Post-op Pain Management: Tylenol PO (pre-op)* and Regional block*  ? ?Induction: Intravenous ? ?PONV Risk Score and Plan: 3 and Ondansetron, Dexamethasone and Midazolam ? ?Airway Management Planned: LMA ? ?Additional Equipment:  ? ?Intra-op Plan:  ? ?Post-operative Plan: Extubation in OR ? ?Informed Consent: I have reviewed the patients History and Physical, chart, labs and discussed the procedure including the risks, benefits and alternatives for the proposed anesthesia with the patient or authorized representative who has indicated his/her understanding and acceptance.  ? ? ? ?Dental advisory given ? ?Plan Discussed with: CRNA ? ?Anesthesia Plan Comments:   ? ? ? ? ? ?Anesthesia Quick Evaluation ? ?

## 2022-04-22 ENCOUNTER — Other Ambulatory Visit: Payer: Self-pay

## 2022-04-22 ENCOUNTER — Ambulatory Visit (HOSPITAL_COMMUNITY)
Admission: RE | Admit: 2022-04-22 | Discharge: 2022-04-22 | Disposition: A | Discharge status: Home / Self Care | Payer: BC Managed Care – PPO | Source: Ambulatory Visit | Attending: Surgery | Admitting: Surgery

## 2022-04-22 ENCOUNTER — Ambulatory Visit (HOSPITAL_COMMUNITY): Payer: BC Managed Care – PPO | Admitting: Physician Assistant

## 2022-04-22 ENCOUNTER — Encounter (HOSPITAL_COMMUNITY): Payer: Self-pay | Admitting: Surgery

## 2022-04-22 ENCOUNTER — Encounter (HOSPITAL_COMMUNITY)
Admission: RE | Disposition: A | Discharge status: Home / Self Care | Payer: Self-pay | Source: Ambulatory Visit | Attending: Surgery

## 2022-04-22 ENCOUNTER — Ambulatory Visit (HOSPITAL_COMMUNITY): Payer: BC Managed Care – PPO | Admitting: Anesthesiology

## 2022-04-22 ENCOUNTER — Ambulatory Visit
Admission: RE | Admit: 2022-04-22 | Discharge: 2022-04-22 | Disposition: A | Discharge status: Home / Self Care | Payer: BC Managed Care – PPO | Source: Ambulatory Visit | Attending: Surgery | Admitting: Surgery

## 2022-04-22 DIAGNOSIS — Z170 Estrogen receptor positive status [ER+]: Secondary | ICD-10-CM | POA: Diagnosis not present

## 2022-04-22 DIAGNOSIS — C50912 Malignant neoplasm of unspecified site of left female breast: Secondary | ICD-10-CM | POA: Insufficient documentation

## 2022-04-22 DIAGNOSIS — Z8041 Family history of malignant neoplasm of ovary: Secondary | ICD-10-CM | POA: Insufficient documentation

## 2022-04-22 DIAGNOSIS — Z803 Family history of malignant neoplasm of breast: Secondary | ICD-10-CM | POA: Insufficient documentation

## 2022-04-22 DIAGNOSIS — G8918 Other acute postprocedural pain: Secondary | ICD-10-CM | POA: Diagnosis not present

## 2022-04-22 DIAGNOSIS — R928 Other abnormal and inconclusive findings on diagnostic imaging of breast: Secondary | ICD-10-CM | POA: Diagnosis not present

## 2022-04-22 DIAGNOSIS — Z853 Personal history of malignant neoplasm of breast: Secondary | ICD-10-CM

## 2022-04-22 HISTORY — PX: BREAST LUMPECTOMY WITH RADIOACTIVE SEED AND SENTINEL LYMPH NODE BIOPSY: SHX6550

## 2022-04-22 HISTORY — PX: SENTINEL NODE BIOPSY: SHX6608

## 2022-04-22 SURGERY — BREAST LUMPECTOMY WITH RADIOACTIVE SEED AND SENTINEL LYMPH NODE BIOPSY
Anesthesia: General | Site: Breast | Laterality: Left

## 2022-04-22 MED ORDER — BUPIVACAINE HCL (PF) 0.5 % IJ SOLN
INTRAMUSCULAR | Status: DC | PRN
  Administered 2022-04-22: 30 mL via PERINEURAL

## 2022-04-22 MED ORDER — DEXAMETHASONE SODIUM PHOSPHATE 10 MG/ML IJ SOLN
INTRAMUSCULAR | Status: DC | PRN
  Administered 2022-04-22: 5 mg

## 2022-04-22 MED ORDER — ACETAMINOPHEN 500 MG PO TABS
1000.0000 mg | ORAL_TABLET | Freq: Once | ORAL | Status: AC
  Administered 2022-04-22: 1000 mg via ORAL

## 2022-04-22 MED ORDER — BUPIVACAINE-EPINEPHRINE 0.25% -1:200000 IJ SOLN
INTRAMUSCULAR | Status: DC | PRN
  Administered 2022-04-22: 16 mL

## 2022-04-22 MED ORDER — LACTATED RINGERS IV SOLN: INTRAVENOUS | Status: DC

## 2022-04-22 MED ORDER — DEXAMETHASONE SODIUM PHOSPHATE 10 MG/ML IJ SOLN
INTRAMUSCULAR | Status: DC | PRN
  Administered 2022-04-22: 5 mg via INTRAVENOUS

## 2022-04-22 MED ORDER — SCOPOLAMINE 1 MG/3DAYS TD PT72
MEDICATED_PATCH | TRANSDERMAL | Status: AC
  Administered 2022-04-22: 1.5 mg via TRANSDERMAL
  Filled 2022-04-22: qty 1

## 2022-04-22 MED ORDER — FENTANYL CITRATE (PF) 250 MCG/5ML IJ SOLN
INTRAMUSCULAR | Status: AC
  Filled 2022-04-22: qty 5

## 2022-04-22 MED ORDER — ONDANSETRON HCL 4 MG/2ML IJ SOLN
INTRAMUSCULAR | Status: AC
  Filled 2022-04-22: qty 2

## 2022-04-22 MED ORDER — CHLORHEXIDINE GLUCONATE CLOTH 2 % EX PADS: 6.0000 | MEDICATED_PAD | Freq: Once | CUTANEOUS | Status: DC

## 2022-04-22 MED ORDER — MAGTRACE LYMPHATIC TRACER
INTRAMUSCULAR | Status: DC | PRN
  Administered 2022-04-22: 2 mL via INTRAMUSCULAR

## 2022-04-22 MED ORDER — FENTANYL CITRATE (PF) 100 MCG/2ML IJ SOLN: 50.0000 ug | Freq: Once | INTRAMUSCULAR | Status: AC

## 2022-04-22 MED ORDER — MIDAZOLAM HCL 2 MG/2ML IJ SOLN
INTRAMUSCULAR | Status: AC
  Administered 2022-04-22: 2 mg via INTRAVENOUS
  Filled 2022-04-22: qty 2

## 2022-04-22 MED ORDER — FENTANYL CITRATE (PF) 100 MCG/2ML IJ SOLN: 25.0000 ug | INTRAMUSCULAR | Status: DC | PRN

## 2022-04-22 MED ORDER — ACETAMINOPHEN 500 MG PO TABS
1000.0000 mg | ORAL_TABLET | ORAL | Status: AC
  Filled 2022-04-22: qty 2

## 2022-04-22 MED ORDER — CEFAZOLIN SODIUM-DEXTROSE 2-4 GM/100ML-% IV SOLN
2.0000 g | INTRAVENOUS | Status: AC
  Administered 2022-04-22: 2 g via INTRAVENOUS
  Filled 2022-04-22: qty 100

## 2022-04-22 MED ORDER — OXYCODONE HCL 5 MG PO TABS: 5.0000 mg | ORAL_TABLET | Freq: Four times a day (QID) | ORAL | 0 refills | Status: DC | PRN

## 2022-04-22 MED ORDER — MIDAZOLAM HCL 2 MG/2ML IJ SOLN: 2.0000 mg | Freq: Once | INTRAMUSCULAR | Status: AC

## 2022-04-22 MED ORDER — MIDAZOLAM HCL 2 MG/2ML IJ SOLN
INTRAMUSCULAR | Status: DC | PRN
Start: 2022-04-22 — End: 2022-04-22
  Administered 2022-04-22: 2 mg via INTRAVENOUS

## 2022-04-22 MED ORDER — PROPOFOL 10 MG/ML IV BOLUS
INTRAVENOUS | Status: AC
  Filled 2022-04-22: qty 20

## 2022-04-22 MED ORDER — MIDAZOLAM HCL 2 MG/2ML IJ SOLN
INTRAMUSCULAR | Status: AC
  Filled 2022-04-22: qty 2

## 2022-04-22 MED ORDER — DEXAMETHASONE SODIUM PHOSPHATE 10 MG/ML IJ SOLN
INTRAMUSCULAR | Status: AC
  Filled 2022-04-22: qty 1

## 2022-04-22 MED ORDER — PHENYLEPHRINE 80 MCG/ML (10ML) SYRINGE FOR IV PUSH (FOR BLOOD PRESSURE SUPPORT)
PREFILLED_SYRINGE | INTRAVENOUS | Status: DC | PRN
  Administered 2022-04-22: 160 ug via INTRAVENOUS
  Administered 2022-04-22 (×2): 80 ug via INTRAVENOUS

## 2022-04-22 MED ORDER — PHENYLEPHRINE 80 MCG/ML (10ML) SYRINGE FOR IV PUSH (FOR BLOOD PRESSURE SUPPORT)
PREFILLED_SYRINGE | INTRAVENOUS | Status: AC
  Filled 2022-04-22: qty 10

## 2022-04-22 MED ORDER — ENSURE PRE-SURGERY PO LIQD: 296.0000 mL | Freq: Once | ORAL | Status: DC

## 2022-04-22 MED ORDER — ONDANSETRON HCL 4 MG/2ML IJ SOLN
INTRAMUSCULAR | Status: DC | PRN
  Administered 2022-04-22: 4 mg via INTRAVENOUS

## 2022-04-22 MED ORDER — 0.9 % SODIUM CHLORIDE (POUR BTL) OPTIME
TOPICAL | Status: DC | PRN
  Administered 2022-04-22: 1000 mL

## 2022-04-22 MED ORDER — BUPIVACAINE-EPINEPHRINE (PF) 0.25% -1:200000 IJ SOLN
INTRAMUSCULAR | Status: AC
  Filled 2022-04-22: qty 30

## 2022-04-22 MED ORDER — ORAL CARE MOUTH RINSE: 15.0000 mL | Freq: Once | OROMUCOSAL | Status: AC

## 2022-04-22 MED ORDER — PROPOFOL 10 MG/ML IV BOLUS
INTRAVENOUS | Status: DC | PRN
  Administered 2022-04-22: 100 mg via INTRAVENOUS
  Administered 2022-04-22: 200 mg via INTRAVENOUS
  Administered 2022-04-22: 100 mg via INTRAVENOUS

## 2022-04-22 MED ORDER — CHLORHEXIDINE GLUCONATE 0.12 % MT SOLN
15.0000 mL | Freq: Once | OROMUCOSAL | Status: AC
  Administered 2022-04-22: 15 mL via OROMUCOSAL
  Filled 2022-04-22: qty 15

## 2022-04-22 MED ORDER — SCOPOLAMINE 1 MG/3DAYS TD PT72: 1.0000 | MEDICATED_PATCH | TRANSDERMAL | Status: DC

## 2022-04-22 MED ORDER — FENTANYL CITRATE (PF) 100 MCG/2ML IJ SOLN
INTRAMUSCULAR | Status: AC
  Administered 2022-04-22: 50 ug via INTRAVENOUS
  Filled 2022-04-22: qty 2

## 2022-04-22 SURGICAL SUPPLY — 34 items
ADH SKN CLS APL DERMABOND .7 (GAUZE/BANDAGES/DRESSINGS) ×4
APL PRP STRL LF DISP 70% ISPRP (MISCELLANEOUS) ×2
APPLIER CLIP 9.375 MED OPEN (MISCELLANEOUS) ×3
APR CLP MED 9.3 20 MLT OPN (MISCELLANEOUS) ×2
BAG COUNTER SPONGE SURGICOUNT (BAG) ×4 IMPLANT
BAG SPNG CNTER NS LX DISP (BAG) ×2
CHLORAPREP W/TINT 26 (MISCELLANEOUS) ×4 IMPLANT
CLIP APPLIE 9.375 MED OPEN (MISCELLANEOUS) ×3 IMPLANT
COVER PROBE W GEL 5X96 (DRAPES) ×5 IMPLANT
COVER SURGICAL LIGHT HANDLE (MISCELLANEOUS) ×4 IMPLANT
DERMABOND ADVANCED (GAUZE/BANDAGES/DRESSINGS) ×2
DERMABOND ADVANCED .7 DNX12 (GAUZE/BANDAGES/DRESSINGS) ×3 IMPLANT
DEVICE DUBIN SPECIMEN MAMMOGRA (MISCELLANEOUS) ×4 IMPLANT
DRAPE CHEST BREAST 15X10 FENES (DRAPES) ×4 IMPLANT
ELECT CAUTERY BLADE 6.4 (BLADE) ×4 IMPLANT
ELECT REM PT RETURN 9FT ADLT (ELECTROSURGICAL) ×3
ELECTRODE REM PT RTRN 9FT ADLT (ELECTROSURGICAL) ×3 IMPLANT
GLOVE SURG SIGNA 7.5 PF LTX (GLOVE) ×4 IMPLANT
GOWN STRL REUS W/ TWL LRG LVL3 (GOWN DISPOSABLE) ×3 IMPLANT
GOWN STRL REUS W/ TWL XL LVL3 (GOWN DISPOSABLE) ×3 IMPLANT
GOWN STRL REUS W/TWL LRG LVL3 (GOWN DISPOSABLE) ×3
GOWN STRL REUS W/TWL XL LVL3 (GOWN DISPOSABLE) ×3
KIT BASIN OR (CUSTOM PROCEDURE TRAY) ×4 IMPLANT
KIT MARKER MARGIN INK (KITS) ×4 IMPLANT
NDL HYPO 25GX1X1/2 BEV (NEEDLE) ×3 IMPLANT
NEEDLE HYPO 25GX1X1/2 BEV (NEEDLE) ×3 IMPLANT
NS IRRIG 1000ML POUR BTL (IV SOLUTION) ×4 IMPLANT
PACK GENERAL/GYN (CUSTOM PROCEDURE TRAY) ×4 IMPLANT
SUT MNCRL AB 4-0 PS2 18 (SUTURE) ×4 IMPLANT
SUT VIC AB 3-0 SH 18 (SUTURE) ×4 IMPLANT
SYR CONTROL 10ML LL (SYRINGE) ×4 IMPLANT
TOWEL GREEN STERILE (TOWEL DISPOSABLE) ×4 IMPLANT
TOWEL GREEN STERILE FF (TOWEL DISPOSABLE) ×4 IMPLANT
TRACER MAGTRACE VIAL (MISCELLANEOUS) ×1 IMPLANT

## 2022-04-22 NOTE — Op Note (Signed)
? ?Caitlin [name] ?04/22/2022 ? ? ?Pre-op Diagnosis: LEFT BREAST CANCER ?    ?Post-op Diagnosis: same ? ?Procedure(s): ?LEFT BREAST RADIOACTIVE SEED GUIDED LUMPECTOMY ?DEEP LEFT AXILLARY SENTINEL LYMPH NODE BIOPSY ?INJECTIO OF MAGTRACE FOR LYMPH NODE MAPPING ? ?Surgeon(s): ?Coralie Keens, MD ? ?Anesthesia: General ? ?Staff:  ?Circulator: Hal Morales, RN ?Scrub Person: Martin Majestic ?Radiation protection practitioner: Rozelle Logan, RN ? ?Estimated Blood Loss: Minimal ?              ?Specimens: sent to path ?        ?Indications: This is a 51 year old female was found on recent screening mammography to have a small mass in her left breast.  A biopsy showed invasive breast cancer.  The decision was made to proceed with a radioactive seed guided left breast lumpectomy and sentinel node biopsy. ? ?Procedure: The patient was identified in the preoperative holding area and anesthesiology performed a pec block.  She was then taken to the operating room.  She is placed upon the operating table and general anesthesia was induced.  I next injected mag trace with the 25-gauge needle underneath the left nipple areolar complex and massaged the left breast.  Her left breast and axilla were then prepped and draped in usual sterile fashion. ?With the aid of the neoprobe identified the radioactive seed at the 2 o'clock position of the left breast several centimeters from the nipple areolar complex.  I anesthetized the lateral edge of the areola with Marcaine and then made an incision with a scalpel.  I then dissected down to the breast tissue with electrocautery and then dissected laterally toward the radioactive seed with the aid of the neoprobe.  I then got past the seed and dissected down into the deeper breast tissue.  I dissected superior and inferiorly with the electrocautery and the neoprobe staying widely around the radioactive seed.  I then grasped the lumpectomy specimen with a Allis clamp.  This caused the radioactive  seed to move closer to the surface and I decided to take the seed as it was easily visible and put in a cup separately.  This was x-rayed confirming it was the seed.  I then completed a lumpectomy with electrocautery going underneath the specimen.  I marked all margins with paint.  The lumpectomy specimen was x-rayed and the previous biopsy clip was seen in the center of the lumpectomy specimen.  The specimen was then sent to pathology for evaluation.  I achieved hemostasis with the cautery.  I next used the mag trace probe and identified an area of increased uptake in the left axilla.  I made an incision with a scalpel and then dissected down into the deep left axillary tissue with electrocautery.  With the aid of the mag trace probe I was able to identify sentinel lymph node which became easily visible.  I dissected widely around this lymph node and completed the removal with the cautery.  This lymph node and the surrounding fat were then sent to pathology for evaluation.  Using the mag trace probe I reevaluated the axilla and found no other increased uptake.  I could not palpate any lymphadenopathy.  Hemostasis peer to be achieved with the cautery and surgical clips.  I then anesthetized the lumpectomy cavity further and placed surgical clips around the periphery of the cavity.  I then closed both incisions with interrupted 3-0 Vicryl sutures and running 4-0 Monocryl sutures.  Dermabond was then applied.  The patient tolerated the  procedure well.  All the counts were correct at the end of the procedure.  The patient was placed in a breast binder and was then extubated in the operating room and taken in stable condition to the recovery room. ?Coralie Keens  ? ?Date: 04/22/2022  Time: 9:08 AM ? ? ? ?

## 2022-04-22 NOTE — Anesthesia Procedure Notes (Signed)
Procedure Name: LMA Insertion ?Date/Time: 04/22/2022 7:58 AM ?Performed by: Reece Agar, CRNA ?Pre-anesthesia Checklist: Patient identified, Emergency Drugs available, Suction available and Patient being monitored ?Patient Re-evaluated:Patient Re-evaluated prior to induction ?Oxygen Delivery Method: Circle System Utilized ?Preoxygenation: Pre-oxygenation with 100% oxygen ?Induction Type: IV induction ?Ventilation: Mask ventilation without difficulty ?LMA: LMA inserted ?LMA Size: 4.0 ?Number of attempts: 1 ?Airway Equipment and Method: Bite block ?Placement Confirmation: positive ETCO2 ?Tube secured with: Tape ?Dental Injury: Teeth and Oropharynx as per pre-operative assessment  ? ? ? ? ?

## 2022-04-22 NOTE — Interval H&P Note (Signed)
History and Physical Interval Note:no change in H and P ? ?04/22/2022 ?6:49 AM ? ?Caitlin [name]  has presented today for surgery, with the diagnosis of LEFT BREAST CANCER.  The various methods of treatment have been discussed with the patient and family. After consideration of risks, benefits and other options for treatment, the patient has consented to  Procedure(s) with comments: ?LEFT BREAST LUMPECTOMY WITH RADIOACTIVE SEED AND SENTINEL LYMPH NODE BIOPSY (Left) - LMA as a surgical intervention.  The patient's history has been reviewed, patient examined, no change in status, stable for surgery.  I have reviewed the patient's chart and labs.  Questions were answered to the patient's satisfaction.   ? ? ?Coralie Keens ? ? ?

## 2022-04-22 NOTE — Discharge Instructions (Addendum)
[facility] ?Office Phone Number [phone] ? ?BREAST BIOPSY/ PARTIAL MASTECTOMY: POST OP INSTRUCTIONS ? ?Always review your discharge instruction sheet given to you by the facility where your surgery was performed. ? ?IF YOU HAVE DISABILITY OR FAMILY LEAVE FORMS, YOU MUST BRING THEM TO THE OFFICE FOR PROCESSING.  DO NOT GIVE THEM TO YOUR DOCTOR. ? ?A prescription for pain medication may be given to you upon discharge.  Take your pain medication as prescribed, if needed.  If narcotic pain medicine is not needed, then you may take acetaminophen (Tylenol) or ibuprofen (Advil) as needed. ?Take your usually prescribed medications unless otherwise directed ?If you need a refill on your pain medication, please contact your pharmacy.  They will contact our office to request authorization.  Prescriptions will not be filled after 5pm or on week-ends. ?You should eat very light the first 24 hours after surgery, such as soup, crackers, pudding, etc.  Resume your normal diet the day after surgery. ?Most patients will experience some swelling and bruising in the breast.  Ice packs and a good support bra will help.  Swelling and bruising can take several days to resolve.  ?It is common to experience some constipation if taking pain medication after surgery.  Increasing fluid intake and taking a stool softener will usually help or prevent this problem from occurring.  A mild laxative (Milk of Magnesia or Miralax) should be taken according to package directions if there are no bowel movements after 48 hours. ?Unless discharge instructions indicate otherwise, you may remove your bandages 24-48 hours after surgery, and you may shower at that time.  You may have steri-strips (small skin tapes) in place directly over the incision.  These strips should be left on the skin for 7-10 days.  If your surgeon used skin glue on the incision, you may shower in 24 hours.  The glue will flake off over the next 2-3 weeks.  Any  sutures or staples will be removed at the office during your follow-up visit. ?ACTIVITIES:  You may resume regular daily activities (gradually increasing) beginning the next day.  Wearing a good support bra or sports bra minimizes pain and swelling.  You may have sexual intercourse when it is comfortable. ?You may drive when you no longer are taking prescription pain medication, you can comfortably wear a seatbelt, and you can safely maneuver your car and apply brakes. ?RETURN TO WORK:  ______________________________________________________________________________________ ?You should see your doctor in the office for a follow-up appointment approximately two weeks after your surgery.  Your doctor?s nurse will typically make your follow-up appointment when she calls you with your pathology report.  Expect your pathology report 2-3 business days after your surgery.  You may call to check if you do not hear from [place] after three days. ?OTHER INSTRUCTIONS: OK TO REMOVE THE BINDER TOMORROW AND SHOWER ?ICE PACK, TYLENOL, AND IBUPROFEN ALSO FOR PAIN ?NO VIGOROUS ACTIVITY FOR ONE WEEK ?_______________________________________________________________________________________________ _____________________________________________________________________________________________________________________________________ ?_____________________________________________________________________________________________________________________________________ ?_____________________________________________________________________________________________________________________________________ ? ?WHEN TO CALL YOUR DOCTOR: ?Fever over 101.0 ?Nausea and/or vomiting. ?Extreme swelling or bruising. ?Continued bleeding from incision. ?Increased pain, redness, or drainage from the incision. ? ?The clinic staff is available to answer your questions during regular business hours.  Please don?t hesitate to call and ask to speak to one of the nurses for  clinical concerns.  If you have a medical emergency, go to the nearest emergency room or call 911.  A surgeon from [facility] is always on call at the hospital. ? ?For further  questions, please visit [email or web address]  g ?

## 2022-04-22 NOTE — Anesthesia Procedure Notes (Signed)
Anesthesia Regional Block: Pectoralis block  ? ?Pre-Anesthetic Checklist: , timeout performed,  Correct Patient, Correct Site, Correct Laterality,  Correct Procedure, Correct Position, site marked,  Risks and benefits discussed,  Surgical consent,  Pre-op evaluation,  At surgeon's request and post-op pain management ? ?Laterality: Left ? ?Prep: Maximum Sterile Barrier Precautions used, chloraprep     ?  ?Needles:  ?Injection technique: Single-shot ? ?Needle Type: Echogenic Stimulator Needle   ? ? ?Needle Length: 9cm  ?Needle Gauge: 22  ? ? ? ?Additional Needles: ? ? ?Procedures:,,,, ultrasound used (permanent image in chart),,    ?Narrative:  ?Start time: 04/22/2022 7:25 AM ?End time: 04/22/2022 7:28 AM ?Injection made incrementally with aspirations every 5 mL. ? ?Performed by: Personally  ?Anesthesiologist: Freddrick March, MD ? ?Additional Notes: ?Monitors applied. No increased pain on injection. No increased resistance to injection. Injection made in 5cc increments. Good needle visualization. Patient tolerated procedure well.  ? ? ? ? ?

## 2022-04-22 NOTE — Transfer of Care (Signed)
Immediate Anesthesia Transfer of Care Note ? ?Patient: Caitlin [name] ? ?Procedure(s) Performed: LEFT BREAST LUMPECTOMY WITH RADIOACTIVE SEED (Left: Breast) ?LEFT SENTINEL LYMPH NODE BIOPSY (Left: Axilla) ? ?Patient Location: PACU ? ?Anesthesia Type:General and Regional ? ?Level of Consciousness: drowsy ? ?Airway & Oxygen Therapy: Patient Spontanous Breathing and Patient connected to face mask oxygen ? ?Post-op Assessment: Report given to RN and Post -op Vital signs reviewed and stable ? ?Post vital signs: Reviewed and stable ? ?Last Vitals:  ?Vitals Value Taken Time  ?BP 109/72 04/22/22 0920  ?Temp 36.5 ?C 04/22/22 0920  ?Pulse 94 04/22/22 0921  ?Resp 19 04/22/22 0921  ?SpO2 100 % 04/22/22 0921  ?Vitals shown include unvalidated device data. ? ?Last Pain:  ?Vitals:  ? 04/22/22 0647  ?TempSrc:   ?PainSc: 0-No pain  ?   ? ?  ? ?Complications: No notable events documented. ?

## 2022-04-22 NOTE — Anesthesia Postprocedure Evaluation (Signed)
Anesthesia Post Note ? ?Patient: Caitlin [name] ? ?Procedure(s) Performed: LEFT BREAST LUMPECTOMY WITH RADIOACTIVE SEED (Left: Breast) ?LEFT SENTINEL LYMPH NODE BIOPSY (Left: Axilla) ? ?  ? ?Patient location during evaluation: PACU ?Anesthesia Type: General and Regional ?Level of consciousness: awake and alert ?Pain management: pain level controlled ?Vital Signs Assessment: post-procedure vital signs reviewed and stable ?Respiratory status: spontaneous breathing, nonlabored ventilation, respiratory function stable and patient connected to nasal cannula oxygen ?Cardiovascular status: blood pressure returned to baseline and stable ?Postop Assessment: no apparent nausea or vomiting ?Anesthetic complications: no ? ? ?No notable events documented. ? ?Last Vitals:  ?Vitals:  ? 04/22/22 0935 04/22/22 0950  ?BP: 113/77 116/76  ?Pulse: 82 85  ?Resp: 13 20  ?Temp:  36.8 ?C  ?SpO2: 97% 96%  ?  ?Last Pain:  ?Vitals:  ? 04/22/22 0950  ?TempSrc:   ?PainSc: 0-No pain  ? ? ?  ?  ?  ?  ?  ?  ? ?Margrit Minner L Gabriellah Rabel ? ? ? ? ?

## 2022-04-23 ENCOUNTER — Other Ambulatory Visit: Payer: Self-pay | Admitting: Neurology

## 2022-04-23 ENCOUNTER — Encounter (HOSPITAL_COMMUNITY): Payer: Self-pay | Admitting: Surgery

## 2022-04-24 LAB — SURGICAL PATHOLOGY

## 2022-04-25 ENCOUNTER — Telehealth: Payer: Self-pay | Admitting: Genetic Counselor

## 2022-04-25 NOTE — Telephone Encounter (Signed)
Negative genetic testing on the BRCAPlus 8 gene panel.  The remainder of her testing will be back in 1-2 weeks and we will call her with those when they are ready. ? ? ?

## 2022-04-29 ENCOUNTER — Telehealth: Payer: Self-pay | Admitting: Genetic Counselor

## 2022-04-29 ENCOUNTER — Encounter: Payer: Self-pay | Admitting: Genetic Counselor

## 2022-04-29 ENCOUNTER — Ambulatory Visit: Payer: Self-pay | Admitting: Genetic Counselor

## 2022-04-29 DIAGNOSIS — Z1379 Encounter for other screening for genetic and chromosomal anomalies: Secondary | ICD-10-CM

## 2022-04-29 NOTE — Progress Notes (Signed)
HPI:  Caitlin [name] was previously seen in the [place] clinic due to a personal and family history of cancer and concerns regarding a hereditary predisposition to cancer. Please refer to our prior cancer genetics clinic note for more information regarding our discussion, assessment and recommendations, at the time. Caitlin [name] recent genetic test results were disclosed to her, as were recommendations warranted by these results. These results and recommendations are discussed in more detail below. ?  ?CANCER HISTORY:  ?   ?Oncology History  ?Malignant neoplasm of upper-outer quadrant of left breast in female, estrogen receptor positive ([place])  ?03/10/2022 Mammogram  ?  Mammogram of the left breast showed irregular hypoechoic mass in the left breast.  Targeted ultrasound performed also confirmed an irregular hypoechoic mass in the left breast at 2:00 2 cm from the nipple measuring 1.1 x 0.8 x 0.5 cm.  Left axilla did not show any enlarged lymphadenopathy. ?   ?03/20/2022 Pathology Results  ?  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  Prognostic showed ER 95% positive moderate staining.  PR 100% positive strong staining.  HER2 equivocal by IHC.  FISH negative ?  ?   ?04/02/2022 Initial Diagnosis  ?  Malignant neoplasm of upper-outer quadrant of left breast in female, estrogen receptor positive ([place]) ?   ?04/25/2022 Genetic Testing  ?  Negative genetic testing on the CancerNext-Expanded+RNainsight panel.  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 The report date is April 25, 2022. ?  ?The CancerNext-Expanded gene panel offered by [facility] and includes sequencing and rearrangement analysis for the following 77 genes: AIP, ALK, APC*, ATM*, AXIN2, BAP1, BARD1, BLM, BMPR1A, BRCA1*, BRCA2*, BRIP1*, CDC73, CDH1*, CDK4, CDKN1B, CDKN2A, CHEK2*, CTNNA1, DICER1, FANCC, FH, FLCN, GALNT12, KIF1B, LZTR1, MAX, MEN1, MET,  MLH1*, MSH2*, MSH3, MSH6*, MUTYH*, NBN, NF1*, NF2, NTHL1, PALB2*, PHOX2B, PMS2*, POT1, PRKAR1A, PTCH1, PTEN*, RAD51C*, RAD51D*, RB1, RECQL, RET, SDHA, SDHAF2, SDHB, SDHC, SDHD, SMAD4, SMARCA4, SMARCB1, SMARCE1, STK11, SUFU, TMEM127, TP53*, TSC1, TSC2, VHL and XRCC2 (sequencing and deletion/duplication); EGFR, EGLN1, HOXB13, KIT, MITF, PDGFRA, POLD1, and POLE (sequencing only); EPCAM and GREM1 (deletion/duplication only). DNA and RNA analyses performed for * genes.  ?   ?  ?  ?FAMILY HISTORY:  ?We obtained a detailed, 4-generation family history.  Significant diagnoses are listed below: ?     ?Family History  ?Problem Relation Age of Onset  ? Thyroid disease Mother    ? Depression Mother    ? Anxiety disorder Mother    ? Obesity Mother    ? Hypertension Father    ? Heart disease Father    ? Heart disease Maternal Aunt    ? Diabetes Maternal Aunt    ? Kidney cancer Maternal Aunt 72  ? Rheum arthritis Paternal Aunt    ? Heart disease Maternal Grandmother    ? COPD Maternal Grandmother    ? Heart disease Maternal Grandfather    ? Ovarian cancer Paternal Grandmother    ?      dx in her 34s  ? Cancer Cousin    ?      unknown cancer in mat first cousin  ?  ?  ?  ?The patient has two sons and a daughter who are cancer free.  She has one sister who is cancer free.  Her mother is living and father is deceased. ?  ?The patient's mother is currently cancer free.  She had full two  sisters, one who had kidney cancer and the other had a son with an unknown cancer.  She also had a paternal half brother who had a brain tumor.  The maternal grandparents are deceased from heart disease. ?  ?The patient's father died of kidney disease. He had two sisters who were cancer free.  His mother died of ovarian cancer and his father died of old age. ?  ?Caitlin [name] is unaware of previous family history of genetic testing for hereditary cancer risks. Patient's maternal ancestors are of [place] descent, and paternal ancestors are  of [place] descent. There is no reported Ashkenazi Jewish ancestry. There is no known consanguinity. ?  ?GENETIC TEST RESULTS: Genetic testing reported out on April 25, 2022 through the CancerNext-Expanded+RNAinsight cancer panel found no pathogenic mutations. The CancerNext-Expanded gene panel offered by [facility] and includes sequencing and rearrangement analysis for the following 77 genes: AIP, ALK, APC*, ATM*, AXIN2, BAP1, BARD1, BLM, BMPR1A, BRCA1*, BRCA2*, BRIP1*, CDC73, CDH1*, CDK4, CDKN1B, CDKN2A, CHEK2*, CTNNA1, DICER1, FANCC, FH, FLCN, GALNT12, KIF1B, LZTR1, MAX, MEN1, MET, MLH1*, MSH2*, MSH3, MSH6*, MUTYH*, NBN, NF1*, NF2, NTHL1, PALB2*, PHOX2B, PMS2*, POT1, PRKAR1A, PTCH1, PTEN*, RAD51C*, RAD51D*, RB1, RECQL, RET, SDHA, SDHAF2, SDHB, SDHC, SDHD, SMAD4, SMARCA4, SMARCB1, SMARCE1, STK11, SUFU, TMEM127, TP53*, TSC1, TSC2, VHL and XRCC2 (sequencing and deletion/duplication); EGFR, EGLN1, HOXB13, KIT, MITF, PDGFRA, POLD1, and POLE (sequencing only); EPCAM and GREM1 (deletion/duplication only). DNA and RNA analyses performed for * genes. The test report has been scanned into EPIC and is located under the Molecular Pathology section of the Results Review tab.  A portion of the result report is included below for reference.  ?  ?  ?  ?We discussed with Caitlin [name] that because current genetic testing is not perfect, it is possible there may be a gene mutation in one of these genes that current testing cannot detect, but that chance is small.  We also discussed, that there could be another gene that has not yet been discovered, or that we have not yet tested, that is responsible for the cancer diagnoses in the family. It is also possible there is a hereditary cause for the cancer in the family that Caitlin [name] did not inherit and therefore was not identified in her testing.  Therefore, it is important to remain in touch with cancer genetics in the future so that we can continue to offer Ms.  [name] the most up to date genetic testing.  ?  ?Genetic testing did identify a variant of uncertain significance (VUS) was identified in the PALB2 gene called c.94C>G.  At this time, it is unknown if this variant is associated with increased cancer risk or if this is a normal finding, but most variants such as this get reclassified to being inconsequential. It should not be used to make medical management decisions. With time, we suspect the lab will determine the significance of this variant, if any. If we do learn more about it, we will try to contact Caitlin [name] to discuss it further. However, it is important to stay in touch with [place] periodically and keep the address and phone number up to date. ?  ?ADDITIONAL GENETIC TESTING: We discussed with Caitlin [name] that her genetic testing was fairly extensive.  If there are genes identified to increase cancer risk that can be analyzed in the future, we would be happy to discuss and coordinate this testing at that time.   ?  ?CANCER SCREENING RECOMMENDATIONS: Caitlin [name] test  result is considered negative (normal).  This means that we have not identified a hereditary cause for her personal history of cancer at this time. Most cancers happen by chance and this negative test suggests that her cancer may fall into this category.   ?  ?While reassuring, this does not definitively rule out a hereditary predisposition to cancer. It is still possible that there could be genetic mutations that are undetectable by current technology. There could be genetic mutations in genes that have not been tested or identified to increase cancer risk.  Therefore, it is recommended she continue to follow the cancer management and screening guidelines provided by her oncology and primary healthcare provider.  ?  ?An individual's cancer risk and medical management are not determined by genetic test results alone. Overall cancer risk assessment incorporates additional factors, including personal  medical history, family history, and any available genetic information that may result in a personalized plan for cancer prevention and surveillance ?  ?RECOMMENDATIONS FOR FAMILY MEMBERS:  Individuals i

## 2022-04-29 NOTE — Progress Notes (Signed)
? ?NEUROLOGY FOLLOW UP OFFICE NOTE ? ?Caitlin [name] ?[id number] ? ?Assessment/Plan:  ? ?Idiopathic intracranial hypertension  ? ?  ?Furosemide '20mg'$  daily.  Potassium level stable.   ?Follow up 9 months. ? ?Subjective:  ?Caitlin [name] is a 51 year old right-handed female who follows up for migraines and mild idiopathic intracranial hypertension ?  ?UPDATE: ?Due to experiencing hair loss, topiramate was switched to furosemide in November.  Most recent K+ level from 4/18 was 3.6.  She had a repeat eye exam with Dr. Katy Fitch on 2/28 which was stable nerve drusen and full VF.  Overall doing well.  Mild headache once in awhile.  She missed a few doses of furosemide and started having the pulsatile tinnitus.  Resolved once she resumed it.  She was diagnosed with breast cancer last month.  She underwent a lumpectomy last week.  Possibly may need hormone and radiation therapy.   ? ?Current NSAIDs/analgesics:  ibuprofen ?Current preventative:  furosemide '20mg'$  daily ?  ?HISTORY: ?She has only had one dull headache since starting the topiramate, when it was snowing, and lasted a couple of hours and didn't require any medication.  Pulsating sensation has decreased significantly.  She may hear it once in a while.  If she stands up quickly or bends over, she may get the head pressure briefly.  She saw Dr. Katy Fitch again and showed minimal improvement in optic nerve but overall stable ?  ?  ?HISTORY: ?She has longstanding history of migraine presenting with visual aura of wavy lines followed by blind spot preceding headache.  Several years ago, she was started on Mirena and developed bilateral pulsatile tinnitus.  She began having new headaches in the Fall of 2020.  They are severe right occipital shooting pain associated with nausea, photophobia, and phonophobia but not associated with speech disturbance, dizziness, numbness or weakness.  She treats with ibuprofen.  In April 2021, she had a cough that triggered a particularly  severe occipital headache.  It was aggravated by change in position, either standing up or bending over.  The severe headache subsided but she had a persistent dull right occipital pressure that lasted 2 weeks.  Other than her typical headaches, she has not had a recurrence of that occipital headache since then.  CT of head on 04/26/2020 was unremarkable.  She had an MRI of brain and internal auditory canals on 06/05/2020 which was normal.  She saw her ophthalmologist, Dr. 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  Visual fields were full.  Repeat exam on 07/05/2020 was stable.  She denies visual obscurations.  She underwent workup for pulsatile tinnitus and idiopathic intracranial hypertension.  TSH from 05/12/2020 was 0.60.   ?MRA of head and neck on 08/29/2020 was normal.  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  She underwent LP on 09/06/2020 which demonstrated opening pressure of 23 cm water and closing pressure of 17 cm water.   ? ?Past medications:  topiramate (hair loss) ? ?PAST MEDICAL HISTORY: ?Past Medical History:  ?Diagnosis Date  ? Anxiety   ? Back pain   ? Edema of both lower extremities   ? Elevated cholesterol   ? Endometriosis   ? Endometriosis   ? Family history of kidney cancer   ? Family history of ovarian cancer   ? Gallbladder problem   ? High cholesterol   ? History of IBS   ? IBS (irritable bowel  syndrome)   ? Infertility, female   ? Vitamin D deficiency   ? ? ?MEDICATIONS: ?Current Outpatient Medications on File Prior to Visit  ?Medication Sig Dispense Refill  ? furosemide (LASIX) 20 MG tablet TAKE ONE TABLET BY MOUTH DAILY 30 tablet 0  ? oxyCODONE (OXY IR/ROXICODONE) 5 MG immediate release tablet Take 1 tablet (5 mg total) by mouth every 6 (six) hours as needed for moderate pain, severe pain or breakthrough pain. 20 tablet 0  ? ?No current facility-administered medications on file prior to visit.   ? ? ?ALLERGIES: ?Allergies  ?Allergen Reactions  ? Ciprofloxacin Other (See Comments)  ?  "achy joints" ?"achy joints" ?  ? Sulfa Antibiotics Rash  ? ? ?FAMILY HISTORY: ?Family History  ?Problem Relation Age of Onset  ? Thyroid disease Mother   ? Depression Mother   ? Anxiety disorder Mother   ? Obesity Mother   ? Hypertension Father   ? Heart disease Father   ? Heart disease Maternal Aunt   ? Diabetes Maternal Aunt   ? Kidney cancer Maternal Aunt 72  ? Rheum arthritis Paternal Aunt   ? Heart disease Maternal Grandmother   ? COPD Maternal Grandmother   ? Heart disease Maternal Grandfather   ? Ovarian cancer Paternal Grandmother   ?     dx in her 32s  ? Cancer Cousin   ?     unknown cancer in mat first cousin  ? ? ?  ?Objective:  ?Blood pressure 134/84, pulse 78, height '5\' 5"'$  (1.651 m), weight 229 lb 12.8 oz (104.2 kg), SpO2 100 %. ?General: No acute distress.  Patient appears well-groomed.   ?Head:  Normocephalic/atraumatic ?Eyes:  Fundi examined but not visualized ?Neck: supple, no paraspinal tenderness, full range of motion ?Heart:  Regular rate and rhythm ?Lungs:  Clear to auscultation bilaterally ?Neurological Exam: alert and oriented to person, place, and time.  Speech fluent and not dysarthric, language intact.  CN II-XII intact. Bulk and tone normal, muscle strength 5/5 throughout.  Sensation to light touch intact.  Deep tendon reflexes 2+ throughout, toes downgoing.  Finger to nose testing intact.  Gait normal, Romberg negative. ? ? ?Metta Clines, DO ? ?CC: Horald Pollen, MD ? ? ? ? ? ? ?

## 2022-04-29 NOTE — Telephone Encounter (Addendum)
Revealed negative genetic testing.  Discussed that we do not know why she has breast cancer or why there is cancer in the family. It could be due to a different gene that we are not testing, or maybe our current technology may not be able to pick something up.  It will be important for her to keep in contact with genetics to keep up with whether additional testing may be needed. ? 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  This will not change medical management. ? ?

## 2022-04-30 ENCOUNTER — Telehealth: Payer: Self-pay | Admitting: *Deleted

## 2022-04-30 ENCOUNTER — Encounter: Payer: Self-pay | Admitting: *Deleted

## 2022-04-30 NOTE — Telephone Encounter (Signed)
Received order for oncotype testing. Requisition faxed to pathology and [facility] °

## 2022-05-01 ENCOUNTER — Ambulatory Visit (INDEPENDENT_AMBULATORY_CARE_PROVIDER_SITE_OTHER): Payer: BC Managed Care – PPO | Admitting: Neurology

## 2022-05-01 ENCOUNTER — Encounter: Payer: Self-pay | Admitting: Neurology

## 2022-05-01 ENCOUNTER — Encounter: Payer: Self-pay | Admitting: Licensed Clinical Social Worker

## 2022-05-01 VITALS — BP 134/84 | HR 78 | Ht 65.0 in | Wt 229.8 lb

## 2022-05-01 DIAGNOSIS — G932 Benign intracranial hypertension: Secondary | ICD-10-CM | POA: Diagnosis not present

## 2022-05-01 NOTE — Progress Notes (Signed)
[facility]  ?Initial Assessment ? ? ?Caitlin [name] is a 51 y.o. year old female contacted by phone. Clinical Social Work was referred by  new pt protocol  for assessment of psychosocial needs.  ? ?SDOH (Social Determinants of Health) assessments performed: Yes ?SDOH Interventions   ? ?Flowsheet Row Most Recent Value  ?SDOH Interventions   ?Food Insecurity Interventions Intervention Not Indicated  ?Financial Strain Interventions Intervention Not Indicated  ?Housing Interventions Intervention Not Indicated  ?Transportation Interventions Intervention Not Indicated  ? ?  ?  ?Distress Screen completed: No ?   ? View : No data to display.  ?  ?  ?  ? ? ? ? ?Family/Social Information:  ?Housing Arrangement: patient lives with husband, 3 kids (59 & 7 yo sons, 51 yo daughter) ?Family members/support persons in your life? 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 Income source: Employment ?Financial concerns: No ?Type of concern: None ?Food access concerns: no ?Religious or spiritual practice: unknown ?Services Currently in place:  n/a ? ?Coping/ Adjustment to diagnosis: ?Patient understands treatment plan and what happens next? yes ?Concerns about diagnosis and/or treatment:  Adjusting post-surgery ?Patient reported stressors: Adjusting to my illness ?Current coping skills/ strengths: Ability for insight , Capable of independent living , Communication skills , Motivation for treatment/growth , and Supportive family/friends  ? ? ? SUMMARY: ?Current SDOH Barriers:  ?None at this time ? ?Clinical Social Work Clinical Goal(s):  ?None at this time ? ?Interventions: ?Discussed common feeling and emotions when being diagnosed with cancer, and the importance of support during treatment ?Informed patient of the support team roles and support services at  [facility] ?Provided CSW contact information and encouraged patient to call with any questions or concerns ? ? ?Follow Up Plan: Patient will contact CSW with any support or resource needs or if she would like to pursue counseling ?Patient verbalizes understanding of plan: Yes ? ? ? ?Mairen Wallenstein E Ozil Stettler, LCSW ?

## 2022-05-06 ENCOUNTER — Encounter: Payer: BC Managed Care – PPO | Admitting: Genetic Counselor

## 2022-05-06 ENCOUNTER — Other Ambulatory Visit: Payer: BC Managed Care – PPO

## 2022-05-06 ENCOUNTER — Encounter (HOSPITAL_COMMUNITY): Payer: Self-pay

## 2022-05-07 DIAGNOSIS — C50412 Malignant neoplasm of upper-outer quadrant of left female breast: Secondary | ICD-10-CM | POA: Diagnosis not present

## 2022-05-07 DIAGNOSIS — Z170 Estrogen receptor positive status [ER+]: Secondary | ICD-10-CM | POA: Diagnosis not present

## 2022-05-08 ENCOUNTER — Encounter (HOSPITAL_COMMUNITY): Payer: Self-pay

## 2022-05-09 ENCOUNTER — Telehealth: Payer: Self-pay | Admitting: *Deleted

## 2022-05-09 ENCOUNTER — Encounter: Payer: Self-pay | Admitting: *Deleted

## 2022-05-09 DIAGNOSIS — Z170 Estrogen receptor positive status [ER+]: Secondary | ICD-10-CM

## 2022-05-09 NOTE — Telephone Encounter (Signed)
Received Oncotype score of 16. Physician team notified. Called pt with results. Discussed chemo not recommended and next step is xrt with Dr. Isidore Moos. Received verbal understanding. Denies further questions or needs. Encourage pt to call with concerns. ?Referral placed for pt to see Dr. Isidore Moos. ?

## 2022-05-13 ENCOUNTER — Inpatient Hospital Stay: Payer: BC Managed Care – PPO | Attending: Hematology and Oncology | Admitting: Hematology and Oncology

## 2022-05-13 ENCOUNTER — Ambulatory Visit: Payer: BC Managed Care – PPO | Attending: Surgery | Admitting: Rehabilitation

## 2022-05-13 ENCOUNTER — Encounter: Payer: Self-pay | Admitting: Hematology and Oncology

## 2022-05-13 ENCOUNTER — Encounter: Payer: Self-pay | Admitting: Rehabilitation

## 2022-05-13 DIAGNOSIS — C50412 Malignant neoplasm of upper-outer quadrant of left female breast: Secondary | ICD-10-CM | POA: Diagnosis not present

## 2022-05-13 DIAGNOSIS — Z8051 Family history of malignant neoplasm of kidney: Secondary | ICD-10-CM | POA: Insufficient documentation

## 2022-05-13 DIAGNOSIS — Z483 Aftercare following surgery for neoplasm: Secondary | ICD-10-CM | POA: Insufficient documentation

## 2022-05-13 DIAGNOSIS — Z170 Estrogen receptor positive status [ER+]: Secondary | ICD-10-CM | POA: Insufficient documentation

## 2022-05-13 DIAGNOSIS — Z8041 Family history of malignant neoplasm of ovary: Secondary | ICD-10-CM | POA: Diagnosis not present

## 2022-05-13 DIAGNOSIS — R293 Abnormal posture: Secondary | ICD-10-CM | POA: Diagnosis not present

## 2022-05-13 NOTE — Assessment & Plan Note (Signed)
This is a very pleasant 50-year-old premenopausal female patient with newly diagnosed left breast invasive ductal carcinoma, ER/PR positive, HER2 negative referred to medical oncology for recommendations. ?She underwent lumpectomy followed by Oncotype DX testing.  Final pathology showed a 1.2 cm tumor, grade 2, margins uninvolved, 3 out of 3 sentinel lymph nodes negative.  Prognostics on prior biopsy showed ER 95% positive, PR 100% staining, HER2 negative and KI of 5% ?Today we have discussed her Oncotype DX results which showed a recurrence score of 16, distant recurrence risk at 9 years of 4% with antiestrogen therapy.  There appears to be no significant chemotherapy benefit in this group ? ?We have discussed about antiestrogen therapy options including tamoxifen versus tamoxifen with ovarian suppression versus ovarian suppression with aromatase inhibitors.  I have discussed mechanism of action and adverse effects of both class of drugs.  She is currently leaning towards tamoxifen alone.  She will return to clinic after completion of adjuvant radiation for further discussion. ? ?Return to clinic late July ?

## 2022-05-13 NOTE — Progress Notes (Signed)
[facility] ?CONSULT NOTE ? ?Patient Care Team: ?[name], MD as PCP - General (Family Medicine) ?Mauro Kaufmann, RN as Oncology Nurse Navigator ?Rockwell Germany, RN as Oncology Nurse Navigator ? ?CHIEF COMPLAINTS/PURPOSE OF CONSULTATION:  ?Newly diagnosed breast cancer ? ?HISTORY OF PRESENTING ILLNESS:  ?Caitlin [name] 51 y.o. female is here because of recent diagnosis of left breast cancer ? ? ? ?I reviewed her records extensively and collaborated the history with the patient. ? ?SUMMARY OF ONCOLOGIC HISTORY: ?Oncology History  ?Malignant neoplasm of upper-outer quadrant of left breast in female, estrogen receptor positive ([place])  ?03/10/2022 Mammogram  ? Mammogram of the left breast showed irregular hypoechoic mass in the left breast.  Targeted ultrasound performed also confirmed an irregular hypoechoic mass in the left breast at 2:00 2 cm from the nipple measuring 1.1 x 0.8 x 0.5 cm.  Left axilla did not show any enlarged lymphadenopathy. ?  ?03/20/2022 Pathology Results  ? 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  Prognostic showed ER 95% positive moderate staining.  PR 100% positive strong staining.  HER2 equivocal by IHC.  FISH negative ? ?  ?04/02/2022 Initial Diagnosis  ? Malignant neoplasm of upper-outer quadrant of left breast in female, estrogen receptor positive ([place]) ?  ?04/22/2022 Surgery  ? She had left breast lumpectomy on April 25 and pathology showed invasive ductal carcinoma Nottingham grade 2 out of 3, 1.0 cm, DCIS, intermediate grade, margins uninvolved by carcinoma at 3 out of 3 sentinel lymph nodes without any evidence of micrometastasis or micrometastasis.  Prognostics on prior biopsy showed ER +95% ER +100% staining HER2 negative, KI of less than 5% ?  ?04/25/2022 Genetic Testing  ? Negative genetic testing on the CancerNext-Expanded+RNainsight panel.  PALB2 c.94C>G  VUS identified. The report date is April 25, 2022. ? ?The CancerNext-Expanded gene panel offered by [facility] and includes sequencing and rearrangement analysis for the following 77 genes: AIP, ALK, APC*, ATM*, AXIN2, BAP1, BARD1, BLM, BMPR1A, BRCA1*, BRCA2*, BRIP1*, CDC73, CDH1*, CDK4, CDKN1B, CDKN2A, CHEK2*, CTNNA1, DICER1, FANCC, FH, FLCN, GALNT12, KIF1B, LZTR1, MAX, MEN1, MET, MLH1*, MSH2*, MSH3, MSH6*, MUTYH*, NBN, NF1*, NF2, NTHL1, PALB2*, PHOX2B, PMS2*, POT1, PRKAR1A, PTCH1, PTEN*, RAD51C*, RAD51D*, RB1, RECQL, RET, SDHA, SDHAF2, SDHB, SDHC, SDHD, SMAD4, SMARCA4, SMARCB1, SMARCE1, STK11, SUFU, TMEM127, TP53*, TSC1, TSC2, VHL and XRCC2 (sequencing and deletion/duplication); EGFR, EGLN1, HOXB13, KIT, MITF, PDGFRA, POLD1, and POLE (sequencing only); EPCAM and GREM1 (deletion/duplication only). DNA and RNA analyses performed for * genes.  ?  ?05/07/2022 Oncotype testing  ? Oncotype recurrence score of 16, distant recurrence risk at 9 years is 4%, group average absolute chemotherapy benefit less than 1% ?  ? ?Interval history ? ?Caitlin [name] is here for a follow up with her husband.  She has been recovering well from the surgery.  Left axillary surgical scar seems to be a bit gaped but no drainage.  No fevers or chills.  She is scheduled to see a radiation oncologist.  Rest of the pertinent 10 point ROS reviewed and negative ? ? ?MEDICAL HISTORY:  ?Past Medical History:  ?Diagnosis Date  ? Anxiety   ? Back pain   ? Edema of both lower extremities   ? Elevated cholesterol   ? Endometriosis   ? Endometriosis   ? Family history of kidney cancer   ? Family history of ovarian cancer   ? Gallbladder problem   ?  High cholesterol   ? History of IBS   ? IBS (irritable bowel syndrome)   ? Infertility, female   ? Vitamin D deficiency   ? ? ?SURGICAL HISTORY: ?Past Surgical History:  ?Procedure Laterality Date  ? BREAST LUMPECTOMY WITH RADIOACTIVE SEED AND SENTINEL LYMPH NODE BIOPSY Left 04/22/2022  ? Procedure: LEFT BREAST  LUMPECTOMY WITH RADIOACTIVE SEED;  Surgeon: Coralie Keens, MD;  Location: [facility];  Service: General;  Laterality: Left;  LMA  ? CHOLECYSTECTOMY  2018  ? LAPAROSCOPY    ? SENTINEL NODE BIOPSY Left 04/22/2022  ? Procedure: LEFT SENTINEL LYMPH NODE BIOPSY;  Surgeon: Coralie Keens, MD;  Location: [place];  Service: General;  Laterality: Left;  ? ? ?SOCIAL HISTORY: ?Social History  ? ?Socioeconomic History  ? Marital status: Married  ?  Spouse name: Gerald Stabs  ? Number of children: 3  ? Years of education: Not on file  ? Highest education level: Not on file  ?Occupational History  ? Occupation: English as a second language teacher  ?Tobacco Use  ? Smoking status: Never  ? Smokeless tobacco: Never  ?Vaping Use  ? Vaping Use: Never used  ?Substance and Sexual Activity  ? Alcohol use: No  ? Drug use: No  ? Sexual activity: Yes  ?Other Topics Concern  ? Not on file  ?Social History Narrative  ? Right Handed  ? Two Story Home  ? Drinks Caffeine Occasionally   ? ?Social Determinants of Health  ? ?Financial Resource Strain: Low Risk   ? Difficulty of Paying Living Expenses: Not hard at all  ?Food Insecurity: No Food Insecurity  ? Worried About Charity fundraiser in the Last Year: Never true  ? Ran Out of Food in the Last Year: Never true  ?Transportation Needs: No Transportation Needs  ? Lack of Transportation (Medical): No  ? Lack of Transportation (Non-Medical): No  ?Physical Activity: Not on file  ?Stress: Not on file  ?Social Connections: Not on file  ?Intimate Partner Violence: Not on file  ? ? ?FAMILY HISTORY: ?Family History  ?Problem Relation Age of Onset  ? Thyroid disease Mother   ? Depression Mother   ? Anxiety disorder Mother   ? Obesity Mother   ? Hypertension Father   ? Heart disease Father   ? Heart disease Maternal Aunt   ? Diabetes Maternal Aunt   ? Kidney cancer Maternal Aunt 72  ? Rheum arthritis Paternal Aunt   ? Heart disease Maternal Grandmother   ? COPD Maternal Grandmother   ? Heart disease Maternal Grandfather   ? Ovarian cancer  Paternal Grandmother   ?     dx in her 50s  ? Cancer Cousin   ?     unknown cancer in mat first cousin  ? ? ?ALLERGIES:  is allergic to ciprofloxacin and sulfa antibiotics. ? ?MEDICATIONS:  ?Current Outpatient Medications  ?Medication Sig Dispense Refill  ? furosemide (LASIX) 20 MG tablet TAKE ONE TABLET BY MOUTH DAILY 30 tablet 0  ? ?No current facility-administered medications for this visit.  ? ? ?REVIEW OF SYSTEMS:   ?Constitutional: Denies fevers, chills or abnormal night sweats ?Eyes: Denies blurriness of vision, double vision or watery eyes ?Ears, nose, mouth, throat, and face: Denies mucositis or sore throat ?Respiratory: Denies cough, dyspnea or wheezes ?Cardiovascular: Denies palpitation, chest discomfort or lower extremity swelling ?Gastrointestinal:  Denies nausea, heartburn or change in bowel habits ?Skin: Denies abnormal skin rashes ?Lymphatics: Denies new lymphadenopathy or easy bruising ?Neurological:Denies numbness, tingling or new weaknesses ?Behavioral/Psych: Mood  is stable, no new changes  ?Breast: Denies any palpable lumps or discharge ?All other systems were reviewed with the patient and are negative. ? ?PHYSICAL EXAMINATION: ?ECOG PERFORMANCE STATUS: 0 - Asymptomatic ? ?Vitals:  ? 05/13/22 1315  ?BP: 129/88  ?Pulse: (!) 106  ?Resp: 18  ?Temp: (!) 97.5 ?F (36.4 ?C)  ?SpO2: 100%  ? ? ?Filed Weights  ? 05/13/22 1315  ?Weight: 229 lb 3.2 oz (104 kg)  ? ? ? ?GENERAL:alert, no distress and comfortable ?Left breast surgical scar with some yellow granulation tissue/slough, no evidence of purulence or any infection.  Rest of the exam deferred ? ?LABORATORY DATA:  ?I have reviewed the data as listed ?Lab Results  ?Component Value Date  ? WBC 8.7 04/15/2022  ? HGB 14.0 04/15/2022  ? HCT 42.2 04/15/2022  ? MCV 90.6 04/15/2022  ? PLT 282 04/15/2022  ? ?Lab Results  ?Component Value Date  ? NA 138 04/15/2022  ? K 3.6 04/15/2022  ? CL 104 04/15/2022  ? CO2 27 04/15/2022  ? ? ?RADIOGRAPHIC STUDIES: ?I have  personally reviewed the radiological reports and agreed with the findings in the report. ? ?ASSESSMENT AND PLAN:  ?Malignant neoplasm of upper-outer quadrant of left breast in female, estrogen receptor po

## 2022-05-13 NOTE — Therapy (Signed)
?OUTPATIENT PHYSICAL THERAPY BREAST CANCER POST OP FOLLOW UP ? ? ?Patient Name: Caitlin [name] ?MRN: [id number] ?DOB:10-06-1971, 51 y.o., female ?Today's Date: 05/13/2022 ? ? PT End of Session - 05/13/22 0859   ? ? Visit Number 2   ? Number of Visits 10   ? Date for PT Re-Evaluation 07/02/22   ? PT Start Time 0900   ? PT Stop Time 7846   ? PT Time Calculation (min) 45 min   ? Activity Tolerance Patient tolerated treatment well   ? Behavior During Therapy [facility] for tasks assessed/performed   ? ?  ?  ? ?  ? ? ?Past Medical History:  ?Diagnosis Date  ? Anxiety   ? Back pain   ? Edema of both lower extremities   ? Elevated cholesterol   ? Endometriosis   ? Endometriosis   ? Family history of kidney cancer   ? Family history of ovarian cancer   ? Gallbladder problem   ? High cholesterol   ? History of IBS   ? IBS (irritable bowel syndrome)   ? Infertility, female   ? Vitamin D deficiency   ? ?Past Surgical History:  ?Procedure Laterality Date  ? BREAST LUMPECTOMY WITH RADIOACTIVE SEED AND SENTINEL LYMPH NODE BIOPSY Left 04/22/2022  ? Procedure: LEFT BREAST LUMPECTOMY WITH RADIOACTIVE SEED;  Surgeon: Coralie Keens, MD;  Location: [place];  Service: General;  Laterality: Left;  LMA  ? CHOLECYSTECTOMY  2018  ? LAPAROSCOPY    ? SENTINEL NODE BIOPSY Left 04/22/2022  ? Procedure: LEFT SENTINEL LYMPH NODE BIOPSY;  Surgeon: Coralie Keens, MD;  Location: [place];  Service: General;  Laterality: Left;  ? ?Patient Active Problem List  ? Diagnosis Date Noted  ? Genetic testing 04/29/2022  ? Family history of ovarian cancer 04/15/2022  ? Family history of kidney cancer 04/15/2022  ? Malignant neoplasm of upper-outer quadrant of left breast in female, estrogen receptor positive ([place]) 04/02/2022  ? Vitamin D insufficiency 02/18/2021  ? Elevated cholesterol 02/18/2021  ? Class 1 obesity due to excess calories with body mass index (BMI) of 34.0 to 34.9 in adult 02/07/2021  ? Irritable bowel syndrome 02/07/2021  ? ? ?PCP: Horald Pollen,  MD ? ?REFERRING PROVIDER: Coralie Keens, MD ? ?REFERRING DIAG: Lt breast cancer ? ?THERAPY DIAG:  ?Malignant neoplasm of upper-outer quadrant of left breast in female, estrogen receptor positive ([place]) ? ?Abnormal posture ? ?Aftercare following surgery for neoplasm ? ?ONSET DATE: 03/29/22 ? ?SUBJECTIVE:                                                                                                                                                                                          ? ?  SUBJECTIVE STATEMENT: ?The breast is swollen but not red, the incision seems to be not closing but they don't seem to be worried about it. I meet with radiation next week.  ? ?PERTINENT HISTORY:  ?Patient was diagnosed with left IDC. ER/PR positive, HER2 negative KI67 less than 5%. Pt had left lumpectomy and SLNB on 04/22/22 with Dr. [name]. Radiation. No chemotherapy.   ? ?PATIENT GOALS:  Reassess how my recovery is going related to arm function, pain, and swelling. ? ?PAIN:  ?Are you having pain? No ?There is a hard lump under the top of the incision that is tender ? ?PRECAUTIONS: Recent Surgery, left UE Lymphedema risk,  ? ?ACTIVITY LEVEL / LEISURE: I am back to normal activity  ? ? ?OBJECTIVE:  ? ?PATIENT SURVEYS:  ?QUICK DASH: ?27% ? ?OBSERVATIONS: ?Left breast is larger than the right, nipple incision well healed, axillary incision is very red with a rolled inferior edge and some yellow sloughy tissue above this.  PA is okay with healing status.  - incision does not pull open with PROM ? ?PALPATION: firmness possible seroma vs scar tissue medial/inferior to incision not as palpable in the supine position ? ?LYMPHEDEMA ASSESSMENT:   ?UPPER EXTREMITY AROM/PROM: ?  ?A/PROM RIGHT  04/03/2022 ?   ?Shoulder extension    ?Shoulder flexion    ?Shoulder abduction    ?Shoulder internal rotation    ?Shoulder external rotation    ?                        (Blank rows = not tested) ?  ?A/PROM LEFT  04/03/2022 05/13/22  ?Shoulder extension 68    ?Shoulder flexion 160 163  ?Shoulder abduction 165 165  ?Shoulder internal rotation     ?Shoulder external rotation 100 95  ?                        (Blank rows = not tested) ?  ?LYMPHEDEMA ASSESSMENTS:  ?  ?[place] RIGHT  04/03/2022  ?10 cm proximal to olecranon process    ?Olecranon process    ?10 cm proximal to ulnar styloid process    ?Just proximal to ulnar styloid process    ?Across hand at thumb web space    ?At base of 2nd digit    ?(Blank rows = not tested) ?  ?[facility] LEFT  04/03/2022 05/13/22  ?10 cm proximal to olecranon process 37 37  ?Olecranon process 29.5 30  ?10 cm proximal to ulnar styloid process 24 25  ?Just proximal to ulnar styloid process 17.1 17.3  ?Across hand at thumb web space 19.6 19.4  ?At base of 2nd digit 6.5 6.6  ?(Blank rows = not tested) ?  ? ?PATIENT EDUCATION:  ?Education details: POC, briefly self MLD for the Left breast omitting inguinal steps for today ?Person educated: Patient ?Education method: Explanation, Demonstration, Tactile cues, Verbal cues, and Handouts ?Education comprehension: verbalized understanding, returned demonstration, verbal cues required, tactile cues required, and needs further education ? ? ?HOME EXERCISE PROGRAM: ? Reviewed previously given post op HEP. ? Self MLD Lt breast without inguinal steps for now ? ?TODAY'S TREATMENT ?05/13/22 ?Performed MLD seated in front of the mirror with changes per below ?PT read and demonstrated each step and then pt performed with good understanding ?Education on reasons for MLD, anatomy and physiology, and technique ? ? ?ASSESSMENT: ?CLINICAL IMPRESSION: ?Pt presents for her post-op visit with return to baseline AROM but with left  breast edema and fibrosis noted even visible through the tshirt.  Pt with overall edema of the breast with some abnormal healing of the axillary incision.  Pt will return for regular appointments to decrease breast edema before radiation.  ? ?Pt will benefit from skilled therapeutic intervention  to improve on the following deficits: Decreased knowledge of precautions, impaired UE functional use, pain, decreased ROM, postural dysfunction.  ? ?PT treatment/interventions: ADL/Self care home management, Therapeutic exercises, Patient/Family education, Manual lymph drainage, Compression bandaging, Taping, Manual therapy, and Re-evaluation ? ? ? ? ?GOALS: ?Goals reviewed with patient? Yes ? ?LONG TERM GOALS:  (STG=LTG) ? ?GOALS Name Target Date ?(Remove Blue Hyperlink) Goal status  ?1 Pt will demonstrate she has regained full shoulder ROM and function post operatively compared to baselines.  ?Baseline: 05/13/22 MET  ?2 Pt will be ind with self MLD for the left breast 07/02/22 INITIAL  ?3 Pt will decrease heaviness of the breast by at least 50% 07/02/22 INITIAL  ?     ? ? ? ?PLAN: ?PT FREQUENCY/DURATION: 1-2x per week for 4 weeks ? ?PLAN FOR NEXT SESSION: Lt breast MLD and self MLD review ? ?Manual Lymph Drainage for Left Breast. ?  ?Do daily.  Do slowly. ?Use flat hands with just enough pressure to stretch the skin. ?Do not slide over the skin   (Stretch ? Relax ? Move) ?Lie down or sit comfortably (in a recliner, for example) to do this. ?  ?Do circles at each collar bone near neck 5-7 times (to ?wake up? lots of lymph nodes in this area). ? ?Take slow deep breaths, allowing your belly to balloon out as you breathe in, 5x (to ?wake up? abdominal lymph nodes). ? ?Both armpits--stretch skin in small circles to stimulate intact lymph nodes there, 5-7x. ? ?Redirect fluid from left chest toward right armpit (stretch skin starting at left chest in 3-4 spots working toward right armpit) 3-4x across the chest. ? ?Draw an imaginary diagonal line from upper outer breast through the nipple area toward lower inner breast.  Direct fluid upward and inward from this line toward the pathway across your upper chest (established in #5).  Do this in three rows to treat all the upper inner breast and do each row 3-4x. ? ?Then direct the  fluid down and out from this line toward the pathway down your side going towards the left groin. Do this in three rows and do each row 3-4x. (established in #6)  ? ?Then repeat your pathways (#5 and

## 2022-05-13 NOTE — Patient Instructions (Signed)
[facility]  [address], [address]  [place] [address]  [phone]  After Breast Cancer Class It is recommended you attend the ABC class to be educated on lymphedema risk reduction. This class is free of charge and lasts for 1 hour. It is a 1-time class. You will need to download the Webex app either on your phone or computer. We will send you a link the night before or the morning of the class. You should be able to click on that link to join the class. This is not a confidential class. You don't have to turn your camera on, but other participants may be able to see your email address.  Scar massage You can begin gentle scar massage to you incision sites. Gently place one hand on the incision and move the skin (without sliding on the skin) in various directions. Do this for a few minutes and then you can gently massage either coconut oil or vitamin E cream into the scars.  Compression garment You should continue wearing your compression bra until you feel like you no longer have swelling.  Home exercise Program Continue doing the exercises you were given until you feel like you can do them without feeling any tightness at the end.   Walking Program Studies show that 30 minutes of walking per day (fast enough to elevate your heart rate) can significantly reduce the risk of a cancer recurrence. If you can't walk due to other medical reasons, we encourage you to find another activity you could do (like a stationary bike or water exercise).  Posture After breast cancer surgery, people frequently sit with rounded shoulders posture because it puts their incisions on slack and feels better. If you sit like this and scar tissue forms in that position, you can become very tight and have pain sitting or standing with good posture. Try to be aware of your posture and sit and stand up tall to heal properly.  Follow up PT: It is recommended you return every 3 months for the  first 3 years following surgery to be assessed on the SOZO machine for an L-Dex score. This helps prevent clinically significant lymphedema in 95% of patients. These follow up screens are 10 minute appointments that you are not billed for. 

## 2022-05-15 ENCOUNTER — Encounter: Payer: Self-pay | Admitting: *Deleted

## 2022-05-19 ENCOUNTER — Ambulatory Visit: Payer: BC Managed Care – PPO

## 2022-05-19 ENCOUNTER — Telehealth: Payer: Self-pay | Admitting: Hematology and Oncology

## 2022-05-19 DIAGNOSIS — C50412 Malignant neoplasm of upper-outer quadrant of left female breast: Secondary | ICD-10-CM | POA: Diagnosis not present

## 2022-05-19 DIAGNOSIS — Z483 Aftercare following surgery for neoplasm: Secondary | ICD-10-CM

## 2022-05-19 DIAGNOSIS — R293 Abnormal posture: Secondary | ICD-10-CM

## 2022-05-19 DIAGNOSIS — Z170 Estrogen receptor positive status [ER+]: Secondary | ICD-10-CM

## 2022-05-19 NOTE — Therapy (Signed)
OUTPATIENT PHYSICAL THERAPY BREAST CANCER POST OP FOLLOW UP   Patient Name: Caitlin [name] MRN: [id number] DOB:May 30, 1971, 51 y.o., female Today's Date: 05/19/2022   PT End of Session - 05/19/22 1404     Visit Number 3    Number of Visits 10    Date for PT Re-Evaluation 07/02/22    PT Start Time 1403    PT Stop Time 3748    PT Time Calculation (min) 55 min    Activity Tolerance Patient tolerated treatment well    Behavior During Therapy WFL for tasks assessed/performed             Past Medical History:  Diagnosis Date   Anxiety    Back pain    Edema of both lower extremities    Elevated cholesterol    Endometriosis    Endometriosis    Family history of kidney cancer    Family history of ovarian cancer    Gallbladder problem    High cholesterol    History of IBS    IBS (irritable bowel syndrome)    Infertility, female    Vitamin D deficiency    Past Surgical History:  Procedure Laterality Date   BREAST LUMPECTOMY WITH RADIOACTIVE SEED AND SENTINEL LYMPH NODE BIOPSY Left 04/22/2022   Procedure: LEFT BREAST LUMPECTOMY WITH RADIOACTIVE SEED;  Surgeon: Coralie Keens, MD;  Location: [facility];  Service: General;  Laterality: Left;  LMA   CHOLECYSTECTOMY  2018   LAPAROSCOPY     SENTINEL NODE BIOPSY Left 04/22/2022   Procedure: LEFT SENTINEL LYMPH NODE BIOPSY;  Surgeon: Coralie Keens, MD;  Location: [facility];  Service: General;  Laterality: Left;   Patient Active Problem List   Diagnosis Date Noted   Genetic testing 04/29/2022   Family history of ovarian cancer 04/15/2022   Family history of kidney cancer 04/15/2022   Malignant neoplasm of upper-outer quadrant of left breast in female, estrogen receptor positive ([place]) 04/02/2022   Vitamin D insufficiency 02/18/2021   Elevated cholesterol 02/18/2021   Class 1 obesity due to excess calories with body mass index (BMI) of 34.0 to 34.9 in adult 02/07/2021   Irritable bowel syndrome 02/07/2021    PCP: Horald Pollen,  MD  REFERRING PROVIDER: Coralie Keens, MD  REFERRING DIAG: Lt breast cancer  THERAPY DIAG:  Malignant neoplasm of upper-outer quadrant of left breast in female, estrogen receptor positive ([place])  Abnormal posture  Aftercare following surgery for neoplasm  ONSET DATE: 03/29/22  SUBJECTIVE:  SUBJECTIVE STATEMENT: I've tried the self MLD and scar massage. I was getting looser but I feel like past few days my Lt pectoralis has started to feel like it was getting tighter again so I'm not sure what I did.    PERTINENT HISTORY:  Patient was diagnosed with left IDC. ER/PR positive, HER2 negative KI67 less than 5%. Pt had left lumpectomy and SLNB on 04/22/22 with Dr. [name]. Radiation. No chemotherapy.    PATIENT GOALS:  Reassess how my recovery is going related to arm function, pain, and swelling.  PAIN:  Are you having pain? No There is a hard lump under the top of the incision that is tender  PRECAUTIONS: Recent Surgery, left UE Lymphedema risk,   ACTIVITY LEVEL / LEISURE: I am back to normal activity    OBJECTIVE:   PATIENT SURVEYS:  QUICK DASH: 27%  OBSERVATIONS: Left breast is larger than the right, nipple incision well healed, axillary incision is very red with a rolled inferior edge and some yellow sloughy tissue above this.  PA is okay with healing status.  - incision does not pull open with PROM  PALPATION: firmness possible seroma vs scar tissue medial/inferior to incision not as palpable in the supine position  LYMPHEDEMA ASSESSMENT:   UPPER EXTREMITY AROM/PROM:   A/PROM RIGHT  04/03/2022    Shoulder extension    Shoulder flexion    Shoulder abduction    Shoulder internal rotation    Shoulder external rotation                            (Blank rows = not tested)   A/PROM LEFT   04/03/2022 05/13/22  Shoulder extension 68   Shoulder flexion 160 163  Shoulder abduction 165 165  Shoulder internal rotation     Shoulder external rotation 100 95                          (Blank rows = not tested)   LYMPHEDEMA ASSESSMENTS:    LANDMARK RIGHT  04/03/2022  10 cm proximal to olecranon process    Olecranon process    10 cm proximal to ulnar styloid process    Just proximal to ulnar styloid process    Across hand at thumb web space    At base of 2nd digit    (Blank rows = not tested)   [facility] LEFT  04/03/2022 05/13/22  10 cm proximal to olecranon process 37 37  Olecranon process 29._0 cm proximal to ulnar styloid process 24 25  Just proximal to ulnar styloid process 17.1 17.3  Across hand at thumb web space 19.6 19.4  At base of 2nd digit 6.5 6.6  (Blank rows = not tested)    PATIENT EDUCATION:  Education details: POC, briefly self MLD for the Left breast omitting inguinal steps for today Person educated: Patient Education method: Explanation, Demonstration, Tactile cues, Verbal cues, and Handouts Education comprehension: verbalized understanding, returned demonstration, verbal cues required, tactile cues required, and needs further education   HOME EXERCISE PROGRAM:  Reviewed previously given post op HEP.  Self MLD Lt breast without inguinal steps for now  TODAY'S TREATMENT  05/19/22: Manual Therapy: MLD: In Supine short neck, 5 diaphragmatic breaths, Rt axilla and Lt inguinal nodes, anterior inter-axillary and Lt axillo-inguinal anastomosis, then focused on Lt breast reviewing with pt while performing. P/ROM: In supine to Lt shoulder into flexion, abduction and  D2 to pts tolerance MFR: To Lt axilla during P/ROM  05/13/22 Performed MLD seated in front of the mirror with changes per below PT read and demonstrated each step and then pt performed with good understanding Education on reasons for MLD, anatomy and physiology, and  technique   ASSESSMENT: CLINICAL IMPRESSION: Pt reports had tried self MLD and stretching but felt her Lt pectoralis had tightened down some since she was here last. Reviewed MLD while performing and also incorporated MFR with P/ROM of Lt shoulder while she was here today and she reports this feeling much looser by end of session. Encouraged pt to focus on end AA/ROM stretching in doorway to help facilitate muscle relaxation.   Pt will benefit from skilled therapeutic intervention to improve on the following deficits: Decreased knowledge of precautions, impaired UE functional use, pain, decreased ROM, postural dysfunction.   PT treatment/interventions: ADL/Self care home management, Therapeutic exercises, Patient/Family education, Manual lymph drainage, Compression bandaging, Taping, Manual therapy, and Re-evaluation     GOALS: Goals reviewed with patient? Yes  LONG TERM GOALS:  (STG=LTG)  GOALS Name Target Date (Remove Blue Hyperlink) Goal status  1 Pt will demonstrate she has regained full shoulder ROM and function post operatively compared to baselines.  Baseline: 05/13/22 MET  2 Pt will be ind with self MLD for the left breast 07/02/22 INITIAL  3 Pt will decrease heaviness of the breast by at least 50% 07/02/22 INITIAL          PLAN: PT FREQUENCY/DURATION: 1-2x per week for 4 weeks  PLAN FOR NEXT SESSION: Lt breast MLD and self MLD review     Otelia Limes, PTA 05/19/2022, 2:05 PM    Manual Lymph Drainage for Left Breast.   Do daily.  Do slowly. Use flat hands with just enough pressure to stretch the skin. Do not slide over the skin   (Stretch  Relax  Move) Lie down or sit comfortably (in a recliner, for example) to do this.   Do circles at each collar bone near neck 5-7 times (to "wake up" lots of lymph nodes in this area).  Take slow deep breaths, allowing your belly to balloon out as you breathe in, 5x (to "wake up" abdominal lymph nodes).  Both  armpits--stretch skin in small circles to stimulate intact lymph nodes there, 5-7x.  Redirect fluid from left chest toward right armpit (stretch skin starting at left chest in 3-4 spots working toward right armpit) 3-4x across the chest.  Draw an imaginary diagonal line from upper outer breast through the nipple area toward lower inner breast.  Direct fluid upward and inward from this line toward the pathway across your upper chest (established in #5).  Do this in three rows to treat all the upper inner breast and do each row 3-4x.  Then direct the fluid down and out from this line toward the pathway down your side going towards the left groin. Do this in three rows and do each row 3-4x. (established in #6)   Then repeat your pathways (#5 and #6)  End with repeating #3 and #4 above. (circles in both armpits and the left groin)

## 2022-05-19 NOTE — Telephone Encounter (Signed)
Rescheduled appointment per providers. Patient aware.  ? ?

## 2022-05-20 DIAGNOSIS — C50412 Malignant neoplasm of upper-outer quadrant of left female breast: Secondary | ICD-10-CM | POA: Diagnosis not present

## 2022-05-20 DIAGNOSIS — Z170 Estrogen receptor positive status [ER+]: Secondary | ICD-10-CM | POA: Diagnosis not present

## 2022-05-20 NOTE — Progress Notes (Signed)
Location of Breast Cancer:  Malignant neoplasm of upper-outer quadrant of left breast in female, estrogen receptor positive   Histology per Pathology Report:  04/22/2022 FINAL MICROSCOPIC DIAGNOSIS:  A. BREAST, LEFT, LUMPECTOMY:  -  Invasive ductal carcinoma, Nottingham grade 2 of 3, 1.2 cm  -  Ductal carcinoma in-situ, intermediate grade  -  Margins uninvolved by carcinoma (0.2 cm; anterior margin)  -  Previous biopsy site changes present  -  See oncology table below  B. LYMPH NODE, LEFT AXILLARY, SENTINEL, EXCISION:  -  No carcinoma identified in one lymph node (0/1)  C. LYMPH NODE, LEFT AXILLARY, SENTINEL, EXCISION:  -  No carcinoma identified in one lymph node (0/1)  D. LYMPH NODE, LEFT AXILLARY, SENTINEL, EXCISION:  -  No carcinoma identified in one lymph node (0/1)  Receptor Status: ER(95%), PR (100%), Her2-neu (Negative via FISH), Ki-67(<5%)  Did patient present with symptoms (if so, please note symptoms) or was this found on screening mammography?: Screening mammogram on 03/10/2022 found suspicious mass and distortion in the 2 o'clock region of the left breast.  Past/Anticipated interventions by surgeon, if any:  05/20/2022 Dr. Coralie Keens (office visit) --Physical Exam  She looks well on exam Breast incision is well-healed The axillary incision has a small amount of skin separation but the deep tissue is not open. There is no evidence of underlying seroma and no evidence of infection --Assessment and Plan:  She is doing well postoperatively. Her margins on the lumpectomy were negative and 3 lymph nodes removed were also negative. She will now start her radiation therapy followed by antihormonal therapy. She may use and tie scar creams on her incisions. I will see her back in 6 months (around 11/20/2022) unless she has any issues regarding her incisions or breast and if so, I will see her back as soon as possible  04/22/2022 --Dr. Coralie Keens LEFT BREAST  RADIOACTIVE SEED GUIDED LUMPECTOMY DEEP LEFT AXILLARY SENTINEL LYMPH NODE BIOPSY INJECTIO OF MAGTRACE FOR LYMPH NODE MAPPING  Past/Anticipated interventions by medical oncology, if any:  Under care of Dr. Arletha Pili Iruku --05/13/2022 Today we have discussed her Oncotype DX results which showed a recurrence score of 16, distant recurrence risk at 9 years of 4% with antiestrogen therapy.   There appears to be no significant chemotherapy benefit in this group We have discussed about antiestrogen therapy options including tamoxifen versus tamoxifen with ovarian suppression versus ovarian suppression with aromatase inhibitors.   I have discussed mechanism of action and adverse effects of both class of drugs.   She is currently leaning towards tamoxifen alone.   She will return to clinic after completion of adjuvant radiation for further discussion. Return to clinic late July  Lymphedema issues, if any:  Reports swelling to left breast. Working with PT 2x week for manual massage and range of motion improvement  Pain issues, if any:  Reports soreness to surgical site (especially with palpation), otherwise denies any concerns  SAFETY ISSUES: Prior radiation? No Pacemaker/ICD? No Possible current pregnancy? No--LMP: 05/21/22 Is the patient on methotrexate? No  Current Complaints / other details:  Nothing else of note

## 2022-05-20 NOTE — Progress Notes (Signed)
Radiation Oncology         [phone] ________________________________  Name: Caitlin [name] MRN: [id number]  Date: 05/21/2022  DOB: 02-25-1971  Follow-Up Visit Note  Outpatient  CC: Caitlin Rasmussen, MD  Caitlin Pike, MD  Diagnosis:   No diagnosis found.   S/p lumpectomy: Left Breast UOQ, Invasive ductal carcinoma with intermediate grade DCIS, ER+ / PR+ / Her2-, Grade 2  CHIEF COMPLAINT: Here to discuss management of left breast cancer  Narrative:  The patient returns today for follow-up.     Since consultation date on 04/15/22, she underwent genetic testing on 04/14/22 (reported on 04/16/22) which revealed no clinically significant variants detected by BRCAplus or +RNAinsight testing. 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    The patient opted to proceed with left breast lumpectomy with nodal biopsies on 04/22/22 under the care of Dr. [name]. Pathology revealed: tumor size of 1.2 cm; histology of grade 2 invasive ductal carcinoma with intermediate grade DCIS; all margins negative for both invasive and in-situ carcinoma; margin status to invasive disease of 2 mm from the anterior margin; margin status to in situ disease of 2 mm from the posterior margin; nodal status of 3/3 left axillary sentinel lymph node excisions negative for carcinoma;  ER status: 95% positive with moderate staining intensity; PR status 100% positive with strong staining intensity; Proliferation marker Ki67 at <5%; Her2 status negative; Grade 2.  Oncotype DX was obtained on the final surgical sample and the recurrence score of 16 predicts a risk of recurrence outside the breast over the next 9 years of 4%, if the patient's only systemic therapy is an antiestrogen for 5 years.  It also predicts no significant benefit (1.6 % benefit) from chemotherapy.  Accordingly, the patient met with Dr. [name] on 05/13/22 to discuss further treatment options. Given her Oncotype  results, antiestrogen treatment options were discussed with the patient. Following review of her options, the patient expressed interested in proceeding with tamoxifen but will make a final decision with Dr. [name] following completion of XRT.   During a post-op follow up on 05/06/22, the patient presented with concerns of constant pain in her left axilla. She reproted she had to take ibuprofen for pain and that the left axillary incision felt incompletely closed. She denied drainage noted some redness along the incision. Physical exam performed during this visit revealed some slight superficial separation at the edges of the left axillary incision without an obvious opening appreciated. No signs of infection or seroma were appreciated otherwise.   Symptomatically, the patient reports: ***        ALLERGIES:  is allergic to ciprofloxacin and sulfa antibiotics.  Meds: Current Outpatient Medications  Medication Sig Dispense Refill   furosemide (LASIX) 20 MG tablet TAKE ONE TABLET BY MOUTH DAILY 30 tablet 0   No current facility-administered medications for this encounter.    Physical Findings:  vitals were not taken for this visit. .     General: Alert and oriented, in no acute distress HEENT: Head is normocephalic. Extraocular movements are intact. Oropharynx is clear. Neck: Neck is supple, no palpable cervical or supraclavicular lymphadenopathy. Heart: Regular in rate and rhythm with no murmurs, rubs, or gallops. Chest: Clear to auscultation bilaterally, with no rhonchi, wheezes, or rales. Abdomen: Soft, nontender, nondistended, with no rigidity or guarding. Extremities: No cyanosis or edema. Lymphatics: see Neck Exam Musculoskeletal: symmetric strength and muscle tone throughout. Neurologic: No obvious focalities. Speech is fluent.  Psychiatric:  Judgment and insight are intact. Affect is appropriate. Breast exam reveals ***  Lab Findings: Lab Results  Component Value Date   WBC 8.7  04/15/2022   HGB 14.0 04/15/2022   HCT 42.2 04/15/2022   MCV 90.6 04/15/2022   PLT 282 04/15/2022    '@LASTCHEMISTRY' @  Radiographic Findings: MM Breast Surgical Specimen  Result Date: 04/22/2022 CLINICAL DATA:  Evaluate specimen EXAM: SPECIMEN RADIOGRAPH OF THE LEFT BREAST COMPARISON:  Previous exam(s). FINDINGS: Status post excision of the left breast. The radioactive seed and biopsy marker clip are present, completely intact, and were marked for pathology. Of note, the radioactive seed is in its on container, outside of the specimen. IMPRESSION: Specimen radiograph of the left breast. Electronically Signed   By: Dorise Bullion III M.D.   On: 04/22/2022 08:43  MM LT RADIOACTIVE SEED LOC MAMMO GUIDE  Result Date: 04/21/2022 CLINICAL DATA:  51 year old female for radioactive seed localization of LEFT breast cancer prior to lumpectomy. EXAM: MAMMOGRAPHIC GUIDED RADIOACTIVE SEED LOCALIZATION OF THE LEFT BREAST COMPARISON:  Previous exam(s). FINDINGS: Patient presents for radioactive seed localization prior to LEFT lumpectomy. I met with the patient and we discussed the procedure of seed localization including benefits and alternatives. We discussed the high likelihood of a successful procedure. We discussed the risks of the procedure including infection, bleeding, tissue injury and further surgery. We discussed the low dose of radioactivity involved in the procedure. Informed, written consent was given. The usual time-out protocol was performed immediately prior to the procedure. Using mammographic guidance, sterile technique, 1% lidocaine and an I-125 radioactive seed, the RIBBON biopsy clip was localized using a LATERAL approach. The follow-up mammogram images confirm the seed in the expected location and were marked for Dr. [name]. Follow-up survey of the patient confirms presence of the radioactive seed. Order number of I-125 seed:  [id number]. Total activity:  6.073 millicuries.  Reference Date:  03/19/2022. The patient tolerated the procedure well and was [facility]. She was given instructions regarding seed removal. IMPRESSION: Radioactive seed localization LEFT breast. No apparent complications. Electronically Signed   By: Margarette Canada M.D.   On: 04/21/2022 14:34   Impression/Plan: We discussed adjuvant radiotherapy today.  I recommend *** in order to ***.  I reviewed the logistics, benefits, risks, and potential side effects of this treatment in detail. Risks may include but not necessary be limited to acute and late injury tissue in the radiation fields such as skin irritation (change in color/pigmentation, itching, dryness, pain, peeling). She may experience fatigue. We also discussed possible risk of long term cosmetic changes or scar tissue. There is also a smaller risk for lung toxicity, ***cardiac toxicity, ***brachial plexopathy, ***lymphedema, ***musculoskeletal changes, ***rib fragility or ***induction of a second malignancy, ***late chronic non-healing soft tissue wound.    The patient asked good questions which I answered to her satisfaction. She is enthusiastic about proceeding with treatment. A consent form has been *** signed and placed in her chart.  A total of *** medically necessary complex treatment devices will be fabricated and supervised by me: *** fields with MLCs for custom blocks to protect heart, and lungs;  and, a Vac-lok. MORE COMPLEX DEVICES MAY BE MADE IN DOSIMETRY FOR FIELD IN FIELD BEAMS FOR DOSE HOMOGENEITY.  I have requested : 3D Simulation which is medically necessary to give adequate dose to at risk tissues while sparing lungs and heart.  I have requested a DVH of the following structures: lungs, heart, *** lumpectomy cavity.  The patient will receive *** Gy in *** fractions to the *** with *** fields.  This will be *** followed by a boost.  On date of service, in total, I spent *** minutes on this encounter. Patient was seen in person.   _____________________________________   Eppie Gibson, MD  This document serves as a record of services personally performed by Eppie Gibson, MD. It was created on her behalf by Roney Mans, a trained medical scribe. The creation of this record is based on the scribe's personal observations and the provider's statements to them. This document has been checked and approved by the attending provider.

## 2022-05-21 ENCOUNTER — Ambulatory Visit
Admission: RE | Admit: 2022-05-21 | Discharge: 2022-05-21 | Disposition: A | Discharge status: Home / Self Care | Payer: BC Managed Care – PPO | Source: Ambulatory Visit | Attending: Radiation Oncology | Admitting: Radiation Oncology

## 2022-05-21 ENCOUNTER — Ambulatory Visit: Payer: BC Managed Care – PPO | Admitting: Radiation Oncology

## 2022-05-21 ENCOUNTER — Encounter: Payer: Self-pay | Admitting: Radiation Oncology

## 2022-05-21 ENCOUNTER — Other Ambulatory Visit: Payer: Self-pay

## 2022-05-21 VITALS — BP 120/78 | HR 92 | Temp 96.5°F | Resp 18 | Ht 65.0 in | Wt 229.0 lb

## 2022-05-21 DIAGNOSIS — Z170 Estrogen receptor positive status [ER+]: Secondary | ICD-10-CM | POA: Insufficient documentation

## 2022-05-21 DIAGNOSIS — C50412 Malignant neoplasm of upper-outer quadrant of left female breast: Secondary | ICD-10-CM | POA: Insufficient documentation

## 2022-05-22 ENCOUNTER — Telehealth: Payer: Self-pay | Admitting: *Deleted

## 2022-05-22 ENCOUNTER — Other Ambulatory Visit: Payer: Self-pay

## 2022-05-22 ENCOUNTER — Ambulatory Visit: Payer: BC Managed Care – PPO

## 2022-05-22 DIAGNOSIS — C50412 Malignant neoplasm of upper-outer quadrant of left female breast: Secondary | ICD-10-CM

## 2022-05-22 DIAGNOSIS — R293 Abnormal posture: Secondary | ICD-10-CM

## 2022-05-22 DIAGNOSIS — Z483 Aftercare following surgery for neoplasm: Secondary | ICD-10-CM

## 2022-05-22 DIAGNOSIS — Z170 Estrogen receptor positive status [ER+]: Secondary | ICD-10-CM | POA: Diagnosis not present

## 2022-05-22 NOTE — Telephone Encounter (Signed)
Called patient to ask about coming in for labs on 06-04-22 @ 8:30 am, lvm for a return call

## 2022-05-22 NOTE — Therapy (Addendum)
OUTPATIENT PHYSICAL THERAPY BREAST CANCER POST OP FOLLOW UP   Patient Name: Caitlin [name] MRN: [id number] DOB:1971/06/05, 51 y.o., female Today's Date: 05/22/2022   PT End of Session - 05/22/22 0911     Visit Number 4    Number of Visits 10    Date for PT Re-Evaluation 07/02/22    PT Start Time 0906    PT Stop Time 8676    PT Time Calculation (min) 56 min    Activity Tolerance Patient tolerated treatment well    Behavior During Therapy WFL for tasks assessed/performed             Past Medical History:  Diagnosis Date   Anxiety    Back pain    Edema of both lower extremities    Elevated cholesterol    Endometriosis    Endometriosis    Family history of kidney cancer    Family history of ovarian cancer    Gallbladder problem    High cholesterol    History of IBS    IBS (irritable bowel syndrome)    Infertility, female    Vitamin D deficiency    Past Surgical History:  Procedure Laterality Date   BREAST LUMPECTOMY WITH RADIOACTIVE SEED AND SENTINEL LYMPH NODE BIOPSY Left 04/22/2022   Procedure: LEFT BREAST LUMPECTOMY WITH RADIOACTIVE SEED;  Surgeon: Coralie Keens, MD;  Location: [facility];  Service: General;  Laterality: Left;  LMA   CHOLECYSTECTOMY  2018   LAPAROSCOPY     SENTINEL NODE BIOPSY Left 04/22/2022   Procedure: LEFT SENTINEL LYMPH NODE BIOPSY;  Surgeon: Coralie Keens, MD;  Location: [facility];  Service: General;  Laterality: Left;   Patient Active Problem List   Diagnosis Date Noted   Genetic testing 04/29/2022   Family history of ovarian cancer 04/15/2022   Family history of kidney cancer 04/15/2022   Malignant neoplasm of upper-outer quadrant of left breast in female, estrogen receptor positive ([place]) 04/02/2022   Vitamin D insufficiency 02/18/2021   Elevated cholesterol 02/18/2021   Class 1 obesity due to excess calories with body mass index (BMI) of 34.0 to 34.9 in adult 02/07/2021   Irritable bowel syndrome 02/07/2021    PCP: Horald Pollen,  MD  REFERRING PROVIDER: Coralie Keens, MD  REFERRING DIAG: Lt breast cancer  THERAPY DIAG:  Malignant neoplasm of upper-outer quadrant of left breast in female, estrogen receptor positive ([place])  Abnormal posture  Aftercare following surgery for neoplasm  ONSET DATE: 03/29/22  SUBJECTIVE:  SUBJECTIVE STATEMENT: I saw Dr. [name] and Dr. Isidore Moos since I was here last and he did not think I had a seroma but she did. So the plan for now is to hold off on my radiation simulation until 6/7 and plan to start radiation 6/14 in case my breast reduces further from the MLD. The seroma does not need to be drained at this time. I think my breast is getting smaller though and I can tell my Lt shoulder A/ROM is getting better.   PERTINENT HISTORY:  Patient was diagnosed with left IDC. ER/PR positive, HER2 negative KI67 less than 5%. Pt had left lumpectomy and SLNB on 04/22/22 with Dr. [name]. Radiation. No chemotherapy.    PATIENT GOALS:  Reassess how my recovery is going related to arm function, pain, and swelling.  PAIN:  Are you having pain? No There is a hard lump under the top of the incision that is tender  PRECAUTIONS: Recent Surgery, left UE Lymphedema risk,   ACTIVITY LEVEL / LEISURE: I am back to normal activity    OBJECTIVE:   PATIENT SURVEYS:  QUICK DASH: 27%  OBSERVATIONS: Left breast is larger than the right, nipple incision well healed, axillary incision is very red with a rolled inferior edge and some yellow sloughy tissue above this.  PA is okay with healing status.  - incision does not pull open with PROM  PALPATION: firmness possible seroma vs scar tissue medial/inferior to incision not as palpable in the supine position  LYMPHEDEMA ASSESSMENT:   UPPER EXTREMITY AROM/PROM:    A/PROM RIGHT  04/03/2022    Shoulder extension    Shoulder flexion    Shoulder abduction    Shoulder internal rotation    Shoulder external rotation                            (Blank rows = not tested)   A/PROM LEFT  04/03/2022 05/13/22  Shoulder extension 68   Shoulder flexion 160 163  Shoulder abduction 165 165  Shoulder internal rotation     Shoulder external rotation 100 95                          (Blank rows = not tested)   LYMPHEDEMA ASSESSMENTS:    LANDMARK RIGHT  04/03/2022  10 cm proximal to olecranon process    Olecranon process    10 cm proximal to ulnar styloid process    Just proximal to ulnar styloid process    Across hand at thumb web space    At base of 2nd digit    (Blank rows = not tested)   [facility] LEFT  04/03/2022 05/13/22  10 cm proximal to olecranon process 37 37  Olecranon process 29._0 cm proximal to ulnar styloid process 24 25  Just proximal to ulnar styloid process 17.1 17.3  Across hand at thumb web space 19.6 19.4  At base of 2nd digit 6.5 6.6  (Blank rows = not tested)    PATIENT EDUCATION:  Education details: POC, briefly self MLD for the Left breast omitting inguinal steps for today Person educated: Patient Education method: Explanation, Demonstration, Tactile cues, Verbal cues, and Handouts Education comprehension: verbalized understanding, returned demonstration, verbal cues required, tactile cues required, and needs further education   HOME EXERCISE PROGRAM:  Reviewed previously given post op HEP.  Self MLD Lt breast without inguinal steps for now  TODAY'S TREATMENT  05/22/22: Manual Therapy: MLD: In Supine short neck, 5 diaphragmatic breaths, Rt axilla and Lt inguinal nodes, anterior inter-axillary and Lt axillo-inguinal anastomosis, then focused on Lt breast redirecting towards anastomosis, into Rt S/L for further work to lateral breast redirecting towards posterior inter-axillary and Lt axillo-inguinal anastomosis, then finished  retracing all steps in supine reviewing pressure with pt throughout.  P/ROM: In supine to Lt shoulder into flexion, abduction and D2 to pts tolerance MFR: To Lt axilla during P/ROM Therapeutic Exs:  Pulleys: Flexion and Abduction returning therapist demo x2 mins each and VCs to decrease Lt scapular compensation  05/19/22: Manual Therapy: MLD: In Supine short neck, 5 diaphragmatic breaths, Rt axilla and Lt inguinal nodes, anterior inter-axillary and Lt axillo-inguinal anastomosis, then focused on Lt breast reviewing with pt while performing. P/ROM: In supine to Lt shoulder into flexion, abduction and D2 to pts tolerance MFR: To Lt axilla during P/ROM  05/13/22 Performed MLD seated in front of the mirror with changes per below PT read and demonstrated each step and then pt performed with good understanding Education on reasons for MLD, anatomy and physiology, and technique   ASSESSMENT: CLINICAL IMPRESSION: Pt is scheduled to have her radiation simulation on June 6 to allow for more time for lymphatic drainage/reduction. Then plan to start June 14. Continued with MLD to Lt breast and Lt shoulder P/ROM. Also included AA/ROM with pulleys today which pt tolerated very well and reports feeling good stretch with. She also benefited from cuing to decrease Lt scapular compensation.   Pt will benefit from skilled therapeutic intervention to improve on the following deficits: Decreased knowledge of precautions, impaired UE functional use, pain, decreased ROM, postural dysfunction.   PT treatment/interventions: ADL/Self care home management, Therapeutic exercises, Patient/Family education, Manual lymph drainage, Compression bandaging, Taping, Manual therapy, and Re-evaluation     GOALS: Goals reviewed with patient? Yes  LONG TERM GOALS:  (STG=LTG)  GOALS Name Target Date (Remove Blue Hyperlink) Goal status  1 Pt will demonstrate she has regained full shoulder ROM and function post operatively  compared to baselines.  Baseline: 05/13/22 MET  2 Pt will be ind with self MLD for the left breast 07/02/22 INITIAL  3 Pt will decrease heaviness of the breast by at least 50% 07/02/22 INITIAL          PLAN: PT FREQUENCY/DURATION: 1-2x per week for 4 weeks  PLAN FOR NEXT SESSION: Lt breast MLD and self MLD review; progress AA/ROM exs and HEP prn     Otelia Limes, PTA 05/19/2022, 2:05 PM    Manual Lymph Drainage for Left Breast.   Do daily.  Do slowly. Use flat hands with just enough pressure to stretch the skin. Do not slide over the skin   (Stretch  Relax  Move) Lie down or sit comfortably (in a recliner, for example) to do this.   Do circles at each collar bone near neck 5-7 times (to "wake up" lots of lymph nodes in this area).  Take slow deep breaths, allowing your belly to balloon out as you breathe in, 5x (to "wake up" abdominal lymph nodes).  Both armpits--stretch skin in small circles to stimulate intact lymph nodes there, 5-7x.  Redirect fluid from left chest toward right armpit (stretch skin starting at left chest in 3-4 spots working toward right armpit) 3-4x across the chest.  Draw an imaginary diagonal line from upper outer breast through the nipple area toward lower inner breast.  Direct fluid upward and inward from this line  toward the pathway across your upper chest (established in #5).  Do this in three rows to treat all the upper inner breast and do each row 3-4x.  Then direct the fluid down and out from this line toward the pathway down your side going towards the left groin. Do this in three rows and do each row 3-4x. (established in #6)   Then repeat your pathways (#5 and #6)  End with repeating #3 and #4 above. (circles in both armpits and the left groin)

## 2022-05-28 ENCOUNTER — Ambulatory Visit: Payer: BC Managed Care – PPO | Admitting: Rehabilitation

## 2022-05-28 ENCOUNTER — Encounter: Payer: Self-pay | Admitting: *Deleted

## 2022-05-28 ENCOUNTER — Encounter: Payer: Self-pay | Admitting: Rehabilitation

## 2022-05-28 DIAGNOSIS — Z170 Estrogen receptor positive status [ER+]: Secondary | ICD-10-CM

## 2022-05-28 DIAGNOSIS — C50412 Malignant neoplasm of upper-outer quadrant of left female breast: Secondary | ICD-10-CM | POA: Diagnosis not present

## 2022-05-28 DIAGNOSIS — R293 Abnormal posture: Secondary | ICD-10-CM

## 2022-05-28 DIAGNOSIS — Z483 Aftercare following surgery for neoplasm: Secondary | ICD-10-CM | POA: Diagnosis not present

## 2022-05-28 NOTE — Therapy (Signed)
OUTPATIENT PHYSICAL THERAPY BREAST CANCER POST OP FOLLOW UP   Patient Name: Caitlin [name] MRN: [id number] DOB:1971/11/24, 51 y.o., female Today's Date: 05/28/2022   PT End of Session - 05/28/22 0858     Visit Number 5    Number of Visits 10    Date for PT Re-Evaluation 07/02/22    PT Start Time 0900    PT Stop Time 0947    PT Time Calculation (min) 47 min    Activity Tolerance Patient tolerated treatment well    Behavior During Therapy WFL for tasks assessed/performed              Past Medical History:  Diagnosis Date   Anxiety    Back pain    Edema of both lower extremities    Elevated cholesterol    Endometriosis    Endometriosis    Family history of kidney cancer    Family history of ovarian cancer    Gallbladder problem    High cholesterol    History of IBS    IBS (irritable bowel syndrome)    Infertility, female    Vitamin D deficiency    Past Surgical History:  Procedure Laterality Date   BREAST LUMPECTOMY WITH RADIOACTIVE SEED AND SENTINEL LYMPH NODE BIOPSY Left 04/22/2022   Procedure: LEFT BREAST LUMPECTOMY WITH RADIOACTIVE SEED;  Surgeon: Coralie Keens, MD;  Location: [facility];  Service: General;  Laterality: Left;  LMA   CHOLECYSTECTOMY  2018   LAPAROSCOPY     SENTINEL NODE BIOPSY Left 04/22/2022   Procedure: LEFT SENTINEL LYMPH NODE BIOPSY;  Surgeon: Coralie Keens, MD;  Location: [facility];  Service: General;  Laterality: Left;   Patient Active Problem List   Diagnosis Date Noted   Genetic testing 04/29/2022   Family history of ovarian cancer 04/15/2022   Family history of kidney cancer 04/15/2022   Malignant neoplasm of upper-outer quadrant of left breast in female, estrogen receptor positive ([place]) 04/02/2022   Vitamin D insufficiency 02/18/2021   Elevated cholesterol 02/18/2021   Class 1 obesity due to excess calories with body mass index (BMI) of 34.0 to 34.9 in adult 02/07/2021   Irritable bowel syndrome 02/07/2021    PCP: Horald Pollen, MD  REFERRING PROVIDER: Coralie Keens, MD  REFERRING DIAG: Lt breast cancer  THERAPY DIAG:  Malignant neoplasm of upper-outer quadrant of left breast in female, estrogen receptor positive ([place])  Abnormal posture  Aftercare following surgery for neoplasm  ONSET DATE: 03/29/22  SUBJECTIVE:  SUBJECTIVE STATEMENT: I have been feeling better.  The massage helps.  PERTINENT HISTORY:  Patient was diagnosed with left IDC. ER/PR positive, HER2 negative KI67 less than 5%. Pt had left lumpectomy and SLNB on 04/22/22 with Dr. [name]. Radiation. No chemotherapy.  Starts radiation 06/11/22.   PATIENT GOALS:  Reassess how my recovery is going related to arm function, pain, and swelling.  PAIN:  Are you having pain? No There is a hard lump under the top of the incision that is tender  PRECAUTIONS: Recent Surgery, left UE Lymphedema risk,   ACTIVITY LEVEL / LEISURE: I am back to normal activity    OBJECTIVE:   PATIENT SURVEYS:  QUICK DASH: 27%  OBSERVATIONS: Left breast is larger than the right, nipple incision well healed, axillary incision is very red with a rolled inferior edge and some yellow sloughy tissue above this.  PA is okay with healing status.  - incision does not pull open with PROM  PALPATION: firmness possible seroma vs scar tissue medial/inferior to incision not as palpable in the supine position  LYMPHEDEMA ASSESSMENT:   UPPER EXTREMITY AROM/PROM:   A/PROM RIGHT  04/03/2022    Shoulder extension    Shoulder flexion    Shoulder abduction    Shoulder internal rotation    Shoulder external rotation                            (Blank rows = not tested)   A/PROM LEFT  04/03/2022 05/13/22  Shoulder extension 68   Shoulder flexion 160 163  Shoulder abduction 165 165  Shoulder  internal rotation     Shoulder external rotation 100 95                          (Blank rows = not tested)   LYMPHEDEMA ASSESSMENTS:    LANDMARK RIGHT  04/03/2022  10 cm proximal to olecranon process    Olecranon process    10 cm proximal to ulnar styloid process    Just proximal to ulnar styloid process    Across hand at thumb web space    At base of 2nd digit    (Blank rows = not tested)   [facility] LEFT  04/03/2022 05/13/22  10 cm proximal to olecranon process 37 37  Olecranon process 29.'5 30  10 ' cm proximal to ulnar styloid process 24 25  Just proximal to ulnar styloid process 17.1 17.3  Across hand at thumb web space 19.6 19.4  At base of 2nd digit 6.5 6.6  (Blank rows = not tested)    PATIENT EDUCATION:  Education details: POC, briefly self MLD for the Left breast omitting inguinal steps for today Person educated: Patient Education method: Explanation, Demonstration, Tactile cues, Verbal cues, and Handouts Education comprehension: verbalized understanding, returned demonstration, verbal cues required, tactile cues required, and needs further education   HOME EXERCISE PROGRAM:  Reviewed previously given post op HEP.  Self MLD Lt breast without inguinal steps for now  TODAY'S TREATMENT 05/28/22 Manual Therapy: MLD: With pt permission, In Supine: short neck, superficial and deep abdominals, bil axillary nodes and Lt inguinal nodes, anterior inter-axillary and Lt axillo-inguinal anastomosis, then focused on Lt breast reviewing with pt while performing. P/ROM: In supine to Lt shoulder into flexion, abduction and D2 to pts tolerance MFR: To Lt axilla during P/ROM and pectoralis  05/19/22: Manual Therapy: MLD: In Supine short neck, 5 diaphragmatic breaths, Rt  axilla and Lt inguinal nodes, anterior inter-axillary and Lt axillo-inguinal anastomosis, then focused on Lt breast reviewing with pt while performing. P/ROM: In supine to Lt shoulder into flexion, abduction and D2 to pts  tolerance MFR: To Lt axilla during P/ROM  05/13/22 Performed MLD seated in front of the mirror with changes per below PT read and demonstrated each step and then pt performed with good understanding Education on reasons for MLD, anatomy and physiology, and technique   ASSESSMENT: CLINICAL IMPRESSION: Small golf ball seroma noted under incision but breast is overall improved and still decreases in size with MT.   Pt will benefit from skilled therapeutic intervention to improve on the following deficits: Decreased knowledge of precautions, impaired UE functional use, pain, decreased ROM, postural dysfunction.   PT treatment/interventions: ADL/Self care home management, Therapeutic exercises, Patient/Family education, Manual lymph drainage, Compression bandaging, Taping, Manual therapy, and Re-evaluation     GOALS: Goals reviewed with patient? Yes  LONG TERM GOALS:  (STG=LTG)  GOALS Name Target Date (Remove Blue Hyperlink) Goal status  1 Pt will demonstrate she has regained full shoulder ROM and function post operatively compared to baselines.  Baseline: 05/13/22 MET  2 Pt will be ind with self MLD for the left breast 07/02/22 INITIAL  3 Pt will decrease heaviness of the breast by at least 50% 07/02/22 INITIAL          PLAN: PT FREQUENCY/DURATION: 1-2x per week for 4 weeks  PLAN FOR NEXT SESSION: Lt breast MLD and self MLD review     Shan Levans, PT     Manual Lymph Drainage for Left Breast.   Do daily.  Do slowly. Use flat hands with just enough pressure to stretch the skin. Do not slide over the skin   (Stretch  Relax  Move) Lie down or sit comfortably (in a recliner, for example) to do this.   Do circles at each collar bone near neck 5-7 times (to "wake up" lots of lymph nodes in this area).  Take slow deep breaths, allowing your belly to balloon out as you breathe in, 5x (to "wake up" abdominal lymph nodes).  Both armpits--stretch skin in small circles to  stimulate intact lymph nodes there, 5-7x.  Redirect fluid from left chest toward right armpit (stretch skin starting at left chest in 3-4 spots working toward right armpit) 3-4x across the chest.  Draw an imaginary diagonal line from upper outer breast through the nipple area toward lower inner breast.  Direct fluid upward and inward from this line toward the pathway across your upper chest (established in #5).  Do this in three rows to treat all the upper inner breast and do each row 3-4x.  Then direct the fluid down and out from this line toward the pathway down your side going towards the left groin. Do this in three rows and do each row 3-4x. (established in #6)   Then repeat your pathways (#5 and #6)  End with repeating #3 and #4 above. (circles in both armpits and the left groin)

## 2022-05-30 ENCOUNTER — Encounter: Payer: Self-pay | Admitting: Rehabilitation

## 2022-05-30 ENCOUNTER — Ambulatory Visit: Payer: BC Managed Care – PPO | Attending: Surgery | Admitting: Rehabilitation

## 2022-05-30 DIAGNOSIS — C50412 Malignant neoplasm of upper-outer quadrant of left female breast: Secondary | ICD-10-CM | POA: Insufficient documentation

## 2022-05-30 DIAGNOSIS — Z483 Aftercare following surgery for neoplasm: Secondary | ICD-10-CM | POA: Insufficient documentation

## 2022-05-30 DIAGNOSIS — R293 Abnormal posture: Secondary | ICD-10-CM | POA: Insufficient documentation

## 2022-05-30 DIAGNOSIS — Z170 Estrogen receptor positive status [ER+]: Secondary | ICD-10-CM | POA: Diagnosis not present

## 2022-05-30 NOTE — Therapy (Signed)
OUTPATIENT PHYSICAL THERAPY BREAST CANCER POST OP FOLLOW UP   Patient Name: Caitlin [name] MRN: [id number] DOB:01/18/1971, 51 y.o., female Today's Date: 05/30/2022   PT End of Session - 05/30/22 0900     Visit Number 6    Number of Visits 10    Date for PT Re-Evaluation 07/02/22    PT Start Time 0902    PT Stop Time 0947    PT Time Calculation (min) 45 min    Activity Tolerance Patient tolerated treatment well    Behavior During Therapy WFL for tasks assessed/performed              Past Medical History:  Diagnosis Date   Anxiety    Back pain    Edema of both lower extremities    Elevated cholesterol    Endometriosis    Endometriosis    Family history of kidney cancer    Family history of ovarian cancer    Gallbladder problem    High cholesterol    History of IBS    IBS (irritable bowel syndrome)    Infertility, female    Vitamin D deficiency    Past Surgical History:  Procedure Laterality Date   BREAST LUMPECTOMY WITH RADIOACTIVE SEED AND SENTINEL LYMPH NODE BIOPSY Left 04/22/2022   Procedure: LEFT BREAST LUMPECTOMY WITH RADIOACTIVE SEED;  Surgeon: Coralie Keens, MD;  Location: [place];  Service: General;  Laterality: Left;  LMA   CHOLECYSTECTOMY  2018   LAPAROSCOPY     SENTINEL NODE BIOPSY Left 04/22/2022   Procedure: LEFT SENTINEL LYMPH NODE BIOPSY;  Surgeon: Coralie Keens, MD;  Location: [place];  Service: General;  Laterality: Left;   Patient Active Problem List   Diagnosis Date Noted   Genetic testing 04/29/2022   Family history of ovarian cancer 04/15/2022   Family history of kidney cancer 04/15/2022   Malignant neoplasm of upper-outer quadrant of left breast in female, estrogen receptor positive ([place]) 04/02/2022   Vitamin D insufficiency 02/18/2021   Elevated cholesterol 02/18/2021   Class 1 obesity due to excess calories with body mass index (BMI) of 34.0 to 34.9 in adult 02/07/2021   Irritable bowel syndrome 02/07/2021    PCP: Horald Pollen, MD  REFERRING PROVIDER: Coralie Keens, MD  REFERRING DIAG: Lt breast cancer  THERAPY DIAG:  Malignant neoplasm of upper-outer quadrant of left breast in female, estrogen receptor positive ([place])  Abnormal posture  Aftercare following surgery for neoplasm  ONSET DATE: 03/29/22  SUBJECTIVE:  SUBJECTIVE STATEMENT: It felt like it swelled up after gardening yesterday but back down today   PERTINENT HISTORY:  Patient was diagnosed with left IDC. ER/PR positive, HER2 negative KI67 less than 5%. Pt had left lumpectomy and SLNB on 04/22/22 with Dr. [name]. Radiation. No chemotherapy.  Starts radiation 06/11/22.   PATIENT GOALS:  Reassess how my recovery is going related to arm function, pain, and swelling.  PAIN:  Are you having pain? No There is a hard lump under the top of the incision that is tender  PRECAUTIONS: Recent Surgery, left UE Lymphedema risk,   ACTIVITY LEVEL / LEISURE: I am back to normal activity    OBJECTIVE:   PATIENT SURVEYS:  QUICK DASH: 27%  OBSERVATIONS: Left breast is larger than the right, nipple incision well healed, axillary incision is very red with a rolled inferior edge and some yellow sloughy tissue above this.  PA is okay with healing status.  - incision does not pull open with PROM  PALPATION: firmness possible seroma vs scar tissue medial/inferior to incision not as palpable in the supine position  LYMPHEDEMA ASSESSMENT:   UPPER EXTREMITY AROM/PROM:   A/PROM RIGHT  04/03/2022    Shoulder extension    Shoulder flexion    Shoulder abduction    Shoulder internal rotation    Shoulder external rotation                            (Blank rows = not tested)   A/PROM LEFT  04/03/2022 05/13/22  Shoulder extension 68   Shoulder flexion 160 163  Shoulder  abduction 165 165  Shoulder internal rotation     Shoulder external rotation 100 95                          (Blank rows = not tested)   LYMPHEDEMA ASSESSMENTS:    LANDMARK RIGHT  04/03/2022  10 cm proximal to olecranon process    Olecranon process    10 cm proximal to ulnar styloid process    Just proximal to ulnar styloid process    Across hand at thumb web space    At base of 2nd digit    (Blank rows = not tested)   [facility] LEFT  04/03/2022 05/13/22  10 cm proximal to olecranon process 37 37  Olecranon process 29.'5 30  10 ' cm proximal to ulnar styloid process 24 25  Just proximal to ulnar styloid process 17.1 17.3  Across hand at thumb web space 19.6 19.4  At base of 2nd digit 6.5 6.6  (Blank rows = not tested)    PATIENT EDUCATION:  Education details: POC, briefly self MLD for the Left breast omitting inguinal steps for today Person educated: Patient Education method: Explanation, Demonstration, Tactile cues, Verbal cues, and Handouts Education comprehension: verbalized understanding, returned demonstration, verbal cues required, tactile cues required, and needs further education   HOME EXERCISE PROGRAM:  Reviewed previously given post op HEP.  Self MLD Lt breast without inguinal steps for now  TODAY'S TREATMENT Pt permission and consent throughout each step of examination and treatment with modification and draping if requested when working on sensitive areas  05/30/22 Manual Therapy: MLD: With pt permission, In Supine: short neck, superficial and deep abdominals, bil axillary nodes and Lt inguinal nodes, anterior inter-axillary and Lt axillo-inguinal anastomosis, then focused on Lt breast reviewing with pt while performing. P/ROM: In supine to Lt shoulder into  flexion, abduction and D2 to pts tolerance MFR: To Lt axilla during P/ROM and pectoralis   05/28/22 Manual Therapy: MLD: With pt permission, In Supine: short neck, superficial and deep abdominals, bil axillary  nodes and Lt inguinal nodes, anterior inter-axillary and Lt axillo-inguinal anastomosis, then focused on Lt breast reviewing with pt while performing. P/ROM: In supine to Lt shoulder into flexion, abduction and D2 to pts tolerance MFR: To Lt axilla during P/ROM and pectoralis  05/19/22: Manual Therapy: MLD: In Supine short neck, 5 diaphragmatic breaths, Rt axilla and Lt inguinal nodes, anterior inter-axillary and Lt axillo-inguinal anastomosis, then focused on Lt breast reviewing with pt while performing. P/ROM: In supine to Lt shoulder into flexion, abduction and D2 to pts tolerance MFR: To Lt axilla during P/ROM  05/13/22 Performed MLD seated in front of the mirror with changes per below PT read and demonstrated each step and then pt performed with good understanding Education on reasons for MLD, anatomy and physiology, and technique   ASSESSMENT: CLINICAL IMPRESSION: Small golf ball seroma noted under incision but breast is overall improved and still decreases in size with MT.   Pt will benefit from skilled therapeutic intervention to improve on the following deficits: Decreased knowledge of precautions, impaired UE functional use, pain, decreased ROM, postural dysfunction.   PT treatment/interventions: ADL/Self care home management, Therapeutic exercises, Patient/Family education, Manual lymph drainage, Compression bandaging, Taping, Manual therapy, and Re-evaluation     GOALS: Goals reviewed with patient? Yes  LONG TERM GOALS:  (STG=LTG)  GOALS Name Target Date (Remove Blue Hyperlink) Goal status  1 Pt will demonstrate she has regained full shoulder ROM and function post operatively compared to baselines.  Baseline: 05/13/22 MET  2 Pt will be ind with self MLD for the left breast 07/02/22 INITIAL  3 Pt will decrease heaviness of the breast by at least 50% 07/02/22 INITIAL          PLAN: PT FREQUENCY/DURATION: 1-2x per week for 4 weeks  PLAN FOR NEXT SESSION: Lt breast MLD  and self MLD review     Shan Levans, PT     Manual Lymph Drainage for Left Breast.   Do daily.  Do slowly. Use flat hands with just enough pressure to stretch the skin. Do not slide over the skin   (Stretch  Relax  Move) Lie down or sit comfortably (in a recliner, for example) to do this.   Do circles at each collar bone near neck 5-7 times (to "wake up" lots of lymph nodes in this area).  Take slow deep breaths, allowing your belly to balloon out as you breathe in, 5x (to "wake up" abdominal lymph nodes).  Both armpits--stretch skin in small circles to stimulate intact lymph nodes there, 5-7x.  Redirect fluid from left chest toward right armpit (stretch skin starting at left chest in 3-4 spots working toward right armpit) 3-4x across the chest.  Draw an imaginary diagonal line from upper outer breast through the nipple area toward lower inner breast.  Direct fluid upward and inward from this line toward the pathway across your upper chest (established in #5).  Do this in three rows to treat all the upper inner breast and do each row 3-4x.  Then direct the fluid down and out from this line toward the pathway down your side going towards the left groin. Do this in three rows and do each row 3-4x. (established in #6)   Then repeat your pathways (#5 and #6)  End with repeating #  3 and #4 above. (circles in both armpits and the left groin)

## 2022-06-02 ENCOUNTER — Ambulatory Visit: Payer: BC Managed Care – PPO

## 2022-06-02 ENCOUNTER — Encounter: Payer: BC Managed Care – PPO | Admitting: Rehabilitation

## 2022-06-02 DIAGNOSIS — Z483 Aftercare following surgery for neoplasm: Secondary | ICD-10-CM | POA: Diagnosis not present

## 2022-06-02 DIAGNOSIS — Z170 Estrogen receptor positive status [ER+]: Secondary | ICD-10-CM

## 2022-06-02 DIAGNOSIS — R293 Abnormal posture: Secondary | ICD-10-CM | POA: Diagnosis not present

## 2022-06-02 DIAGNOSIS — C50412 Malignant neoplasm of upper-outer quadrant of left female breast: Secondary | ICD-10-CM | POA: Diagnosis not present

## 2022-06-02 NOTE — Therapy (Signed)
OUTPATIENT PHYSICAL THERAPY BREAST CANCER POST OP FOLLOW UP   Patient Name: Caitlin [name] MRN: [id number] DOB:1971/09/29, 51 y.o., female Today's Date: 06/02/2022   PT End of Session - 06/02/22 1401     Visit Number 7    Number of Visits 10    Date for PT Re-Evaluation 07/02/22    PT Start Time 1400    PT Stop Time 1457    PT Time Calculation (min) 57 min    Activity Tolerance Patient tolerated treatment well    Behavior During Therapy WFL for tasks assessed/performed              Past Medical History:  Diagnosis Date   Anxiety    Back pain    Edema of both lower extremities    Elevated cholesterol    Endometriosis    Endometriosis    Family history of kidney cancer    Family history of ovarian cancer    Gallbladder problem    High cholesterol    History of IBS    IBS (irritable bowel syndrome)    Infertility, female    Vitamin D deficiency    Past Surgical History:  Procedure Laterality Date   BREAST LUMPECTOMY WITH RADIOACTIVE SEED AND SENTINEL LYMPH NODE BIOPSY Left 04/22/2022   Procedure: LEFT BREAST LUMPECTOMY WITH RADIOACTIVE SEED;  Surgeon: Coralie Keens, MD;  Location: [place];  Service: General;  Laterality: Left;  LMA   CHOLECYSTECTOMY  2018   LAPAROSCOPY     SENTINEL NODE BIOPSY Left 04/22/2022   Procedure: LEFT SENTINEL LYMPH NODE BIOPSY;  Surgeon: Coralie Keens, MD;  Location: [place];  Service: General;  Laterality: Left;   Patient Active Problem List   Diagnosis Date Noted   Genetic testing 04/29/2022   Family history of ovarian cancer 04/15/2022   Family history of kidney cancer 04/15/2022   Malignant neoplasm of upper-outer quadrant of left breast in female, estrogen receptor positive ([place]) 04/02/2022   Vitamin D insufficiency 02/18/2021   Elevated cholesterol 02/18/2021   Class 1 obesity due to excess calories with body mass index (BMI) of 34.0 to 34.9 in adult 02/07/2021   Irritable bowel syndrome 02/07/2021    PCP: Horald Pollen, MD  REFERRING PROVIDER: Coralie Keens, MD  REFERRING DIAG: Lt breast cancer  THERAPY DIAG:  Malignant neoplasm of upper-outer quadrant of left breast in female, estrogen receptor positive ([place])  Abnormal posture  Aftercare following surgery for neoplasm  ONSET DATE: 03/29/22  SUBJECTIVE:  SUBJECTIVE STATEMENT: I can tell the firmness is worse when I wake up in the morning but once I put the compression bra on it gets softer. I am so glad my Lt shoulder ROM is better and doesn't hurt anymore.   PERTINENT HISTORY:  Patient was diagnosed with left IDC. ER/PR positive, HER2 negative KI67 less than 5%. Pt had left lumpectomy and SLNB on 04/22/22 with Dr. [name]. Radiation. No chemotherapy.  Starts radiation 06/11/22.   PATIENT GOALS:  Reassess how my recovery is going related to arm function, pain, and swelling.  PAIN:  Are you having pain? No There is a hard lump under the top of the incision that is tender  PRECAUTIONS: Recent Surgery, left UE Lymphedema risk,   ACTIVITY LEVEL / LEISURE: I am back to normal activity    OBJECTIVE:   PATIENT SURVEYS:  QUICK DASH: 27%  OBSERVATIONS: Left breast is larger than the right, nipple incision well healed, axillary incision is very red with a rolled inferior edge and some yellow sloughy tissue above this.  PA is okay with healing status.  - incision does not pull open with PROM  PALPATION: firmness possible seroma vs scar tissue medial/inferior to incision not as palpable in the supine position  LYMPHEDEMA ASSESSMENT:   UPPER EXTREMITY AROM/PROM:   A/PROM RIGHT  04/03/2022    Shoulder extension    Shoulder flexion    Shoulder abduction    Shoulder internal rotation    Shoulder external rotation                            (Blank rows =  not tested)   A/PROM LEFT  04/03/2022 05/13/22  Shoulder extension 68   Shoulder flexion 160 163  Shoulder abduction 165 165  Shoulder internal rotation     Shoulder external rotation 100 95                          (Blank rows = not tested)   LYMPHEDEMA ASSESSMENTS:    LANDMARK RIGHT  04/03/2022  10 cm proximal to olecranon process    Olecranon process    10 cm proximal to ulnar styloid process    Just proximal to ulnar styloid process    Across hand at thumb web space    At base of 2nd digit    (Blank rows = not tested)   [facility] LEFT  04/03/2022 05/13/22  10 cm proximal to olecranon process 37 37  Olecranon process 29.'5 30  10 ' cm proximal to ulnar styloid process 24 25  Just proximal to ulnar styloid process 17.1 17.3  Across hand at thumb web space 19.6 19.4  At base of 2nd digit 6.5 6.6  (Blank rows = not tested)    PATIENT EDUCATION:  Education details: POC, briefly self MLD for the Left breast omitting inguinal steps for today Person educated: Patient Education method: Explanation, Demonstration, Tactile cues, Verbal cues, and Handouts Education comprehension: verbalized understanding, returned demonstration, verbal cues required, tactile cues required, and needs further education   HOME EXERCISE PROGRAM:  Reviewed previously given post op HEP.  Self MLD Lt breast without inguinal steps for now  TODAY'S TREATMENT Pt permission and consent throughout each step of examination and treatment with modification and draping if requested when working on sensitive areas  06/02/22: Manual Therapy: MLD: With pt permission, In Supine: short neck, superficial and deep abdominals, Rt axillary nodes and  Lt inguinal nodes, anterior inter-axillary and Lt axillo-inguinal anastomosis, then focused on Lt breast, then into Rt S/L for further work to lateral breast and fibrotic techniques over area of seroma that caused area to soften drastically, then finished retracing steps in supine  reviewing with pt while performing.  05/30/22 Manual Therapy: MLD: With pt permission, In Supine: short neck, superficial and deep abdominals, bil axillary nodes and Lt inguinal nodes, anterior inter-axillary and Lt axillo-inguinal anastomosis, then focused on Lt breast reviewing with pt while performing. P/ROM: In supine to Lt shoulder into flexion, abduction and D2 to pts tolerance MFR: To Lt axilla during P/ROM and pectoralis   05/28/22 Manual Therapy: MLD: With pt permission, In Supine: short neck, superficial and deep abdominals, bil axillary nodes and Lt inguinal nodes, anterior inter-axillary and Lt axillo-inguinal anastomosis, then focused on Lt breast reviewing with pt while performing. P/ROM: In supine to Lt shoulder into flexion, abduction and D2 to pts tolerance MFR: To Lt axilla during P/ROM and pectoralis    ASSESSMENT: CLINICAL IMPRESSION: Improvements continue to be noted after MLD with area of seroma softening drastically. Pt reports early morning fullness improves after donning her compression bra and her end Lt shoulder is much improved.   Pt will benefit from skilled therapeutic intervention to improve on the following deficits: Decreased knowledge of precautions, impaired UE functional use, pain, decreased ROM, postural dysfunction.   PT treatment/interventions: ADL/Self care home management, Therapeutic exercises, Patient/Family education, Manual lymph drainage, Compression bandaging, Taping, Manual therapy, and Re-evaluation     GOALS: Goals reviewed with patient? Yes  LONG TERM GOALS:  (STG=LTG)  GOALS Name Target Date (Remove Blue Hyperlink) Goal status  1 Pt will demonstrate she has regained full shoulder ROM and function post operatively compared to baselines.  Baseline: 05/13/22 MET  2 Pt will be ind with self MLD for the left breast 07/02/22 INITIAL  3 Pt will decrease heaviness of the breast by at least 50% 07/02/22 INITIAL          PLAN: PT  FREQUENCY/DURATION: 1-2x per week for 4 weeks  PLAN FOR NEXT SESSION: Lt breast MLD and self MLD review     Collie Siad, PTA 06/02/22 2:59 PM      Manual Lymph Drainage for Left Breast.   Do daily.  Do slowly. Use flat hands with just enough pressure to stretch the skin. Do not slide over the skin   (Stretch  Relax  Move) Lie down or sit comfortably (in a recliner, for example) to do this.   Do circles at each collar bone near neck 5-7 times (to "wake up" lots of lymph nodes in this area).  Take slow deep breaths, allowing your belly to balloon out as you breathe in, 5x (to "wake up" abdominal lymph nodes).  Both armpits--stretch skin in small circles to stimulate intact lymph nodes there, 5-7x.  Redirect fluid from left chest toward right armpit (stretch skin starting at left chest in 3-4 spots working toward right armpit) 3-4x across the chest.  Draw an imaginary diagonal line from upper outer breast through the nipple area toward lower inner breast.  Direct fluid upward and inward from this line toward the pathway across your upper chest (established in #5).  Do this in three rows to treat all the upper inner breast and do each row 3-4x.  Then direct the fluid down and out from this line toward the pathway down your side going towards the left groin. Do this in three rows  and do each row 3-4x. (established in #6)   Then repeat your pathways (#5 and #6)  End with repeating #3 and #4 above. (circles in both armpits and the left groin)

## 2022-06-04 ENCOUNTER — Encounter: Payer: Self-pay | Admitting: Physical Therapy

## 2022-06-04 ENCOUNTER — Ambulatory Visit: Payer: BC Managed Care – PPO | Admitting: Physical Therapy

## 2022-06-04 ENCOUNTER — Ambulatory Visit
Admission: RE | Admit: 2022-06-04 | Discharge: 2022-06-04 | Disposition: A | Discharge status: Home / Self Care | Payer: BC Managed Care – PPO | Source: Ambulatory Visit | Attending: Radiation Oncology | Admitting: Radiation Oncology

## 2022-06-04 ENCOUNTER — Other Ambulatory Visit: Payer: Self-pay

## 2022-06-04 DIAGNOSIS — Z510 Encounter for antineoplastic radiation therapy: Secondary | ICD-10-CM | POA: Insufficient documentation

## 2022-06-04 DIAGNOSIS — Z170 Estrogen receptor positive status [ER+]: Secondary | ICD-10-CM

## 2022-06-04 DIAGNOSIS — Z483 Aftercare following surgery for neoplasm: Secondary | ICD-10-CM | POA: Diagnosis not present

## 2022-06-04 DIAGNOSIS — R293 Abnormal posture: Secondary | ICD-10-CM

## 2022-06-04 DIAGNOSIS — C50412 Malignant neoplasm of upper-outer quadrant of left female breast: Secondary | ICD-10-CM | POA: Diagnosis not present

## 2022-06-04 LAB — PREGNANCY, URINE: Preg Test, Ur: NEGATIVE

## 2022-06-04 NOTE — Therapy (Signed)
OUTPATIENT PHYSICAL THERAPY BREAST CANCER POST OP FOLLOW UP   Patient Name: Caitlin [name] MRN: [id number] DOB:January 05, 1971, 51 y.o., female Today's Date: 06/04/2022   PT End of Session - 06/04/22 1504     Visit Number 8    Number of Visits 10    Date for PT Re-Evaluation 07/02/22    PT Start Time 1502    PT Stop Time 1550    PT Time Calculation (min) 48 min    Activity Tolerance Patient tolerated treatment well    Behavior During Therapy WFL for tasks assessed/performed               Past Medical History:  Diagnosis Date   Anxiety    Back pain    Edema of both lower extremities    Elevated cholesterol    Endometriosis    Endometriosis    Family history of kidney cancer    Family history of ovarian cancer    Gallbladder problem    High cholesterol    History of IBS    IBS (irritable bowel syndrome)    Infertility, female    Vitamin D deficiency    Past Surgical History:  Procedure Laterality Date   BREAST LUMPECTOMY WITH RADIOACTIVE SEED AND SENTINEL LYMPH NODE BIOPSY Left 04/22/2022   Procedure: LEFT BREAST LUMPECTOMY WITH RADIOACTIVE SEED;  Surgeon: Coralie Keens, MD;  Location: [place];  Service: General;  Laterality: Left;  LMA   CHOLECYSTECTOMY  2018   LAPAROSCOPY     SENTINEL NODE BIOPSY Left 04/22/2022   Procedure: LEFT SENTINEL LYMPH NODE BIOPSY;  Surgeon: Coralie Keens, MD;  Location: [place];  Service: General;  Laterality: Left;   Patient Active Problem List   Diagnosis Date Noted   Genetic testing 04/29/2022   Family history of ovarian cancer 04/15/2022   Family history of kidney cancer 04/15/2022   Malignant neoplasm of upper-outer quadrant of left breast in female, estrogen receptor positive ([place]) 04/02/2022   Vitamin D insufficiency 02/18/2021   Elevated cholesterol 02/18/2021   Class 1 obesity due to excess calories with body mass index (BMI) of 34.0 to 34.9 in adult 02/07/2021   Irritable bowel syndrome 02/07/2021    PCP: Horald Pollen, MD  REFERRING PROVIDER: Coralie Keens, MD  REFERRING DIAG: Lt breast cancer  THERAPY DIAG:  Aftercare following surgery for neoplasm  Abnormal posture  Malignant neoplasm of upper-outer quadrant of left breast in female, estrogen receptor positive ([place])  ONSET DATE: 03/29/22  SUBJECTIVE:  SUBJECTIVE STATEMENT: I think the firmness is getting better. It fluctuates during the day. I had my radiation simulation today.   PERTINENT HISTORY:  Patient was diagnosed with left IDC. ER/PR positive, HER2 negative KI67 less than 5%. Pt had left lumpectomy and SLNB on 04/22/22 with Dr. [name]. Radiation. No chemotherapy.  Starts radiation 06/11/22.   PATIENT GOALS:  Reassess how my recovery is going related to arm function, pain, and swelling.  PAIN:  Are you having pain? No There is a hard lump under the top of the incision that is tender  PRECAUTIONS: Recent Surgery, left UE Lymphedema risk,   ACTIVITY LEVEL / LEISURE: I am back to normal activity    OBJECTIVE:   PATIENT SURVEYS:  QUICK DASH: 27%  OBSERVATIONS: Left breast is larger than the right, nipple incision well healed, axillary incision is very red with a rolled inferior edge and some yellow sloughy tissue above this.  PA is okay with healing status.  - incision does not pull open with PROM  PALPATION: firmness possible seroma vs scar tissue medial/inferior to incision not as palpable in the supine position  LYMPHEDEMA ASSESSMENT:   UPPER EXTREMITY AROM/PROM:   A/PROM RIGHT  04/03/2022    Shoulder extension    Shoulder flexion    Shoulder abduction    Shoulder internal rotation    Shoulder external rotation                            (Blank rows = not tested)   A/PROM LEFT  04/03/2022 05/13/22  Shoulder extension 68    Shoulder flexion 160 163  Shoulder abduction 165 165  Shoulder internal rotation     Shoulder external rotation 100 95                          (Blank rows = not tested)   LYMPHEDEMA ASSESSMENTS:    LANDMARK RIGHT  04/03/2022  10 cm proximal to olecranon process    Olecranon process    10 cm proximal to ulnar styloid process    Just proximal to ulnar styloid process    Across hand at thumb web space    At base of 2nd digit    (Blank rows = not tested)   [facility] LEFT  04/03/2022 05/13/22  10 cm proximal to olecranon process 37 37  Olecranon process 29._0 cm proximal to ulnar styloid process 24 25  Just proximal to ulnar styloid process 17.1 17.3  Across hand at thumb web space 19.6 19.4  At base of 2nd digit 6.5 6.6  (Blank rows = not tested)    PATIENT EDUCATION:  Education details: POC, briefly self MLD for the Left breast omitting inguinal steps for today Person educated: Patient Education method: Explanation, Demonstration, Tactile cues, Verbal cues, and Handouts Education comprehension: verbalized understanding, returned demonstration, verbal cues required, tactile cues required, and needs further education   HOME EXERCISE PROGRAM:  Reviewed previously given post op HEP.  Self MLD Lt breast without inguinal steps for now  6/723: Manual Therapy: MLD: With pt permission, In Supine: short neck, superficial and deep abdominals, Rt axillary nodes and Lt inguinal nodes, anterior inter-axillary and Lt axillo-inguinal anastomosis, then focused on Lt breast, then into Rt S/L for further work to lateral breast and fibrotic techniques over area of seroma that caused area to soften drastically, then finished retracing steps in supine   TODAY'S  TREATMENT at eval Pt permission and consent throughout each step of examination and treatment with modification and draping if requested when working on sensitive areas  06/02/22: Manual Therapy: MLD: With pt permission, In Supine:  short neck, superficial and deep abdominals, Rt axillary nodes and Lt inguinal nodes, anterior inter-axillary and Lt axillo-inguinal anastomosis, then focused on Lt breast, then into Rt S/L for further work to lateral breast and fibrotic techniques over area of seroma that caused area to soften drastically, then finished retracing steps in supine reviewing with pt while performing.  05/30/22 Manual Therapy: MLD: With pt permission, In Supine: short neck, superficial and deep abdominals, bil axillary nodes and Lt inguinal nodes, anterior inter-axillary and Lt axillo-inguinal anastomosis, then focused on Lt breast reviewing with pt while performing. P/ROM: In supine to Lt shoulder into flexion, abduction and D2 to pts tolerance MFR: To Lt axilla during P/ROM and pectoralis   05/28/22 Manual Therapy: MLD: With pt permission, In Supine: short neck, superficial and deep abdominals, bil axillary nodes and Lt inguinal nodes, anterior inter-axillary and Lt axillo-inguinal anastomosis, then focused on Lt breast reviewing with pt while performing. P/ROM: In supine to Lt shoulder into flexion, abduction and D2 to pts tolerance MFR: To Lt axilla during P/ROM and pectoralis    ASSESSMENT: CLINICAL IMPRESSION: Pt's fibrosis is softening in area of seroma. Continued with MLD to L breast to help decrease swelling and continue to soften fibrosis.   Pt will benefit from skilled therapeutic intervention to improve on the following deficits: Decreased knowledge of precautions, impaired UE functional use, pain, decreased ROM, postural dysfunction.   PT treatment/interventions: ADL/Self care home management, Therapeutic exercises, Patient/Family education, Manual lymph drainage, Compression bandaging, Taping, Manual therapy, and Re-evaluation     GOALS: Goals reviewed with patient? Yes  LONG TERM GOALS:  (STG=LTG)  GOALS Name Target Date (Remove Blue Hyperlink) Goal status  1 Pt will demonstrate she has  regained full shoulder ROM and function post operatively compared to baselines.  Baseline: 05/13/22 MET  2 Pt will be ind with self MLD for the left breast 07/02/22 INITIAL  3 Pt will decrease heaviness of the breast by at least 50% 07/02/22 INITIAL          PLAN: PT FREQUENCY/DURATION: 1-2x per week for 4 weeks  PLAN FOR NEXT SESSION: Lt breast MLD and self MLD review     Manus Gunning, PT 06/04/22 3:57 PM       Manual Lymph Drainage for Left Breast.   Do daily.  Do slowly. Use flat hands with just enough pressure to stretch the skin. Do not slide over the skin   (Stretch  Relax  Move) Lie down or sit comfortably (in a recliner, for example) to do this.   Do circles at each collar bone near neck 5-7 times (to "wake up" lots of lymph nodes in this area).  Take slow deep breaths, allowing your belly to balloon out as you breathe in, 5x (to "wake up" abdominal lymph nodes).  Both armpits--stretch skin in small circles to stimulate intact lymph nodes there, 5-7x.  Redirect fluid from left chest toward right armpit (stretch skin starting at left chest in 3-4 spots working toward right armpit) 3-4x across the chest.  Draw an imaginary diagonal line from upper outer breast through the nipple area toward lower inner breast.  Direct fluid upward and inward from this line toward the pathway across your upper chest (established in #5).  Do this in three rows to  treat all the upper inner breast and do each row 3-4x.  Then direct the fluid down and out from this line toward the pathway down your side going towards the left groin. Do this in three rows and do each row 3-4x. (established in #6)   Then repeat your pathways (#5 and #6)  End with repeating #3 and #4 above. (circles in both armpits and the left groin)

## 2022-06-06 DIAGNOSIS — Z170 Estrogen receptor positive status [ER+]: Secondary | ICD-10-CM | POA: Diagnosis not present

## 2022-06-06 DIAGNOSIS — Z510 Encounter for antineoplastic radiation therapy: Secondary | ICD-10-CM | POA: Diagnosis not present

## 2022-06-06 DIAGNOSIS — C50412 Malignant neoplasm of upper-outer quadrant of left female breast: Secondary | ICD-10-CM | POA: Diagnosis not present

## 2022-06-08 ENCOUNTER — Other Ambulatory Visit: Payer: Self-pay | Admitting: Neurology

## 2022-06-08 ENCOUNTER — Encounter: Payer: Self-pay | Admitting: *Deleted

## 2022-06-09 ENCOUNTER — Ambulatory Visit: Payer: BC Managed Care – PPO | Admitting: Rehabilitation

## 2022-06-09 ENCOUNTER — Encounter: Payer: Self-pay | Admitting: Rehabilitation

## 2022-06-09 DIAGNOSIS — Z170 Estrogen receptor positive status [ER+]: Secondary | ICD-10-CM

## 2022-06-09 DIAGNOSIS — Z483 Aftercare following surgery for neoplasm: Secondary | ICD-10-CM | POA: Diagnosis not present

## 2022-06-09 DIAGNOSIS — C50412 Malignant neoplasm of upper-outer quadrant of left female breast: Secondary | ICD-10-CM | POA: Diagnosis not present

## 2022-06-09 DIAGNOSIS — R293 Abnormal posture: Secondary | ICD-10-CM | POA: Diagnosis not present

## 2022-06-09 NOTE — Therapy (Signed)
OUTPATIENT PHYSICAL THERAPY BREAST CANCER POST OP FOLLOW UP   Patient Name: Caitlin [name] MRN: [id number] DOB:1971/10/17, 51 y.o., female Today's Date: 06/09/2022   PT End of Session - 06/09/22 1058     Visit Number 9    Number of Visits 10    Date for PT Re-Evaluation 07/02/22    PT Start Time 1100    PT Stop Time 1140    PT Time Calculation (min) 40 min    Activity Tolerance Patient tolerated treatment well    Behavior During Therapy WFL for tasks assessed/performed               Past Medical History:  Diagnosis Date   Anxiety    Back pain    Edema of both lower extremities    Elevated cholesterol    Endometriosis    Endometriosis    Family history of kidney cancer    Family history of ovarian cancer    Gallbladder problem    High cholesterol    History of IBS    IBS (irritable bowel syndrome)    Infertility, female    Vitamin D deficiency    Past Surgical History:  Procedure Laterality Date   BREAST LUMPECTOMY WITH RADIOACTIVE SEED AND SENTINEL LYMPH NODE BIOPSY Left 04/22/2022   Procedure: LEFT BREAST LUMPECTOMY WITH RADIOACTIVE SEED;  Surgeon: Coralie Keens, MD;  Location: [place];  Service: General;  Laterality: Left;  LMA   CHOLECYSTECTOMY  2018   LAPAROSCOPY     SENTINEL NODE BIOPSY Left 04/22/2022   Procedure: LEFT SENTINEL LYMPH NODE BIOPSY;  Surgeon: Coralie Keens, MD;  Location: [place];  Service: General;  Laterality: Left;   Patient Active Problem List   Diagnosis Date Noted   Genetic testing 04/29/2022   Family history of ovarian cancer 04/15/2022   Family history of kidney cancer 04/15/2022   Malignant neoplasm of upper-outer quadrant of left breast in female, estrogen receptor positive ([place]) 04/02/2022   Vitamin D insufficiency 02/18/2021   Elevated cholesterol 02/18/2021   Class 1 obesity due to excess calories with body mass index (BMI) of 34.0 to 34.9 in adult 02/07/2021   Irritable bowel syndrome 02/07/2021    PCP: Horald Pollen, MD  REFERRING PROVIDER: Coralie Keens, MD  REFERRING DIAG: Lt breast cancer  THERAPY DIAG:  Aftercare following surgery for neoplasm  Abnormal posture  Malignant neoplasm of upper-outer quadrant of left breast in female, estrogen receptor positive ([place])  ONSET DATE: 03/29/22  SUBJECTIVE:  SUBJECTIVE STATEMENT: Nothing new  PERTINENT HISTORY:  Patient was diagnosed with left IDC. ER/PR positive, HER2 negative KI67 less than 5%. Pt had left lumpectomy and SLNB on 04/22/22 with Dr. Blackman. Radiation. No chemotherapy.  Starts radiation 06/11/22.   PATIENT GOALS:  Reassess how my recovery is going related to arm function, pain, and swelling.  PAIN:  Are you having pain? No There is a hard lump under the top of the incision that is tender  PRECAUTIONS: Recent Surgery, left UE Lymphedema risk,   ACTIVITY LEVEL / LEISURE: I am back to normal activity    OBJECTIVE:   PATIENT SURVEYS:  QUICK DASH: 27%  OBSERVATIONS: Left breast is larger than the right, nipple incision well healed, axillary incision is very red with a rolled inferior edge and some yellow sloughy tissue above this.  PA is okay with healing status.  - incision does not pull open with PROM  PALPATION: firmness possible seroma vs scar tissue medial/inferior to incision not as palpable in the supine position  LYMPHEDEMA ASSESSMENT:   UPPER EXTREMITY AROM/PROM:                          (Blank rows = not tested)   A/PROM LEFT  04/03/2022 05/13/22  Shoulder extension 68   Shoulder flexion 160 163  Shoulder abduction 165 165  Shoulder internal rotation     Shoulder external rotation 100 95                          (Blank rows = not tested)   LYMPHEDEMA ASSESSMENTS:    LANDMARK RIGHT  04/03/2022  10 cm proximal to olecranon  process    Olecranon process    10 cm proximal to ulnar styloid process    Just proximal to ulnar styloid process    Across hand at thumb web space    At base of 2nd digit    (Blank rows = not tested)   LANDMARK LEFT  04/03/2022 05/13/22  10 cm proximal to olecranon process 37 37  Olecranon process 29.5 30  10 cm proximal to ulnar styloid process 24 25  Just proximal to ulnar styloid process 17.1 17.3  Across hand at thumb web space 19.6 19.4  At base of 2nd digit 6.5 6.6  (Blank rows = not tested)    PATIENT EDUCATION:  Education details: POC, briefly self MLD for the Left breast omitting inguinal steps for today Person educated: Patient Education method: Explanation, Demonstration, Tactile cues, Verbal cues, and Handouts Education comprehension: verbalized understanding, returned demonstration, verbal cues required, tactile cues required, and needs further education  HOME EXERCISE PROGRAM:  Reviewed previously given post op HEP.  Self MLD Lt breast without inguinal steps for now   TODAY'S TREATMENT  Pt permission and consent throughout each step of examination and treatment with modification and draping if requested when working on sensitive areas 06/09/22 Manual Therapy: MLD: With pt permission, In Supine: short neck, superficial and deep abdominals, bil axillary nodes and Lt inguinal nodes, anterior inter-axillary and Lt axillo-inguinal anastomosis, then focused on Lt breast, then into Rt S/L for further work to lateral breast and fibrotic techniques over area of seroma, then finished retracing steps in supine   6/723: Manual Therapy: MLD: With pt permission, In Supine: short neck, superficial and deep abdominals, Rt axillary nodes and Lt inguinal nodes, anterior inter-axillary and Lt axillo-inguinal anastomosis, then focused on Lt breast, then   into Rt S/L for further work to lateral breast and fibrotic techniques over area of seroma that caused area to soften drastically, then  finished retracing steps in supine   06/02/22: Manual Therapy: MLD: With pt permission, In Supine: short neck, superficial and deep abdominals, Rt axillary nodes and Lt inguinal nodes, anterior inter-axillary and Lt axillo-inguinal anastomosis, then focused on Lt breast, then into Rt S/L for further work to lateral breast and fibrotic techniques over area of seroma that caused area to soften drastically, then finished retracing steps in supine reviewing with pt while performing.  ASSESSMENT: CLINICAL IMPRESSION: Continued with MLD to L breast to help decrease swelling and continue to soften fibrosis.   Pt will benefit from skilled therapeutic intervention to improve on the following deficits: Decreased knowledge of precautions, impaired UE functional use, pain, decreased ROM, postural dysfunction.   PT treatment/interventions: ADL/Self care home management, Therapeutic exercises, Patient/Family education, Manual lymph drainage, Compression bandaging, Taping, Manual therapy, and Re-evaluation     GOALS: Goals reviewed with patient? Yes  LONG TERM GOALS:  (STG=LTG)  GOALS Name Target Date (Remove Blue Hyperlink) Goal status  1 Pt will demonstrate she has regained full shoulder ROM and function post operatively compared to baselines.  Baseline: 05/13/22 MET  2 Pt will be ind with self MLD for the left breast 07/02/22 INITIAL  3 Pt will decrease heaviness of the breast by at least 50% 07/02/22 INITIAL          PLAN: PT FREQUENCY/DURATION: 1-2x per week for 4 weeks  PLAN FOR NEXT SESSION: 1 more visit  -  Lt breast MLD and self MLD review     Kara Tevis, PT  06/09/22 11:48 AM       Manual Lymph Drainage for Left Breast.   Do daily.  Do slowly. Use flat hands with just enough pressure to stretch the skin. Do not slide over the skin   (Stretch  Relax  Move) Lie down or sit comfortably (in a recliner, for example) to do this.   Do circles at each collar bone near neck 5-7  times (to "wake up" lots of lymph nodes in this area).  Take slow deep breaths, allowing your belly to balloon out as you breathe in, 5x (to "wake up" abdominal lymph nodes).  Both armpits--stretch skin in small circles to stimulate intact lymph nodes there, 5-7x.  Redirect fluid from left chest toward right armpit (stretch skin starting at left chest in 3-4 spots working toward right armpit) 3-4x across the chest.  Draw an imaginary diagonal line from upper outer breast through the nipple area toward lower inner breast.  Direct fluid upward and inward from this line toward the pathway across your upper chest (established in #5).  Do this in three rows to treat all the upper inner breast and do each row 3-4x.  Then direct the fluid down and out from this line toward the pathway down your side going towards the left groin. Do this in three rows and do each row 3-4x. (established in #6)   Then repeat your pathways (#5 and #6)  End with repeating #3 and #4 above. (circles in both armpits and the left groin)   

## 2022-06-11 ENCOUNTER — Ambulatory Visit
Admission: RE | Admit: 2022-06-11 | Discharge: 2022-06-11 | Disposition: A | Discharge status: Home / Self Care | Payer: BC Managed Care – PPO | Source: Ambulatory Visit | Attending: Radiation Oncology | Admitting: Radiation Oncology

## 2022-06-11 ENCOUNTER — Other Ambulatory Visit: Payer: Self-pay

## 2022-06-11 ENCOUNTER — Ambulatory Visit: Payer: BC Managed Care – PPO | Admitting: Rehabilitation

## 2022-06-11 ENCOUNTER — Encounter: Payer: Self-pay | Admitting: Rehabilitation

## 2022-06-11 DIAGNOSIS — Z170 Estrogen receptor positive status [ER+]: Secondary | ICD-10-CM | POA: Diagnosis not present

## 2022-06-11 DIAGNOSIS — R293 Abnormal posture: Secondary | ICD-10-CM

## 2022-06-11 DIAGNOSIS — Z483 Aftercare following surgery for neoplasm: Secondary | ICD-10-CM

## 2022-06-11 DIAGNOSIS — Z510 Encounter for antineoplastic radiation therapy: Secondary | ICD-10-CM | POA: Diagnosis not present

## 2022-06-11 DIAGNOSIS — C50412 Malignant neoplasm of upper-outer quadrant of left female breast: Secondary | ICD-10-CM | POA: Diagnosis not present

## 2022-06-11 LAB — RAD ONC ARIA SESSION SUMMARY
Course Elapsed Days: 0
Plan Fractions Treated to Date: 1
Plan Prescribed Dose Per Fraction: 2.67 Gy
Plan Total Fractions Prescribed: 15
Plan Total Prescribed Dose: 40.05 Gy
Reference Point Dosage Given to Date: 2.67 Gy
Reference Point Session Dosage Given: 2.67 Gy
Session Number: 1

## 2022-06-11 NOTE — Therapy (Signed)
OUTPATIENT PHYSICAL THERAPY BREAST CANCER POST OP FOLLOW UP   Patient Name: Caitlin [name] MRN: [id number] DOB:August 16, 1971, 51 y.o., female Today's Date: 06/11/2022   PT End of Session - 06/11/22 0903     Visit Number 10    Number of Visits 10    Date for PT Re-Evaluation 07/02/22    PT Start Time 0905    PT Stop Time 0946    PT Time Calculation (min) 41 min    Activity Tolerance Patient tolerated treatment well    Behavior During Therapy WFL for tasks assessed/performed               Past Medical History:  Diagnosis Date   Anxiety    Back pain    Edema of both lower extremities    Elevated cholesterol    Endometriosis    Endometriosis    Family history of kidney cancer    Family history of ovarian cancer    Gallbladder problem    High cholesterol    History of IBS    IBS (irritable bowel syndrome)    Infertility, female    Vitamin D deficiency    Past Surgical History:  Procedure Laterality Date   BREAST LUMPECTOMY WITH RADIOACTIVE SEED AND SENTINEL LYMPH NODE BIOPSY Left 04/22/2022   Procedure: LEFT BREAST LUMPECTOMY WITH RADIOACTIVE SEED;  Surgeon: Coralie Keens, MD;  Location: [place];  Service: General;  Laterality: Left;  LMA   CHOLECYSTECTOMY  2018   LAPAROSCOPY     SENTINEL NODE BIOPSY Left 04/22/2022   Procedure: LEFT SENTINEL LYMPH NODE BIOPSY;  Surgeon: Coralie Keens, MD;  Location: [place];  Service: General;  Laterality: Left;   Patient Active Problem List   Diagnosis Date Noted   Genetic testing 04/29/2022   Family history of ovarian cancer 04/15/2022   Family history of kidney cancer 04/15/2022   Malignant neoplasm of upper-outer quadrant of left breast in female, estrogen receptor positive ([place]) 04/02/2022   Vitamin D insufficiency 02/18/2021   Elevated cholesterol 02/18/2021   Class 1 obesity due to excess calories with body mass index (BMI) of 34.0 to 34.9 in adult 02/07/2021   Irritable bowel syndrome 02/07/2021    PCP: Horald Pollen, MD  REFERRING PROVIDER: Coralie Keens, MD  REFERRING DIAG: Lt breast cancer  THERAPY DIAG:  Aftercare following surgery for neoplasm  Abnormal posture  Malignant neoplasm of upper-outer quadrant of left breast in female, estrogen receptor positive ([place])  ONSET DATE: 03/29/22  SUBJECTIVE:  SUBJECTIVE STATEMENT: Nothing new - ready for radiation to start  PERTINENT HISTORY:  Patient was diagnosed with left IDC. ER/PR positive, HER2 negative KI67 less than 5%. Pt had left lumpectomy and SLNB on 04/22/22 with Dr. [name]. Radiation. No chemotherapy.  Starts radiation 06/11/22.   PATIENT GOALS:  Reassess how my recovery is going related to arm function, pain, and swelling.  PAIN:  Are you having pain? No There is a hard lump under the top of the incision that is tender  PRECAUTIONS: Recent Surgery, left UE Lymphedema risk,   ACTIVITY LEVEL / LEISURE: I am back to normal activity    OBJECTIVE:   PATIENT SURVEYS:  QUICK DASH: 27%  OBSERVATIONS: Left breast is larger than the right, nipple incision well healed, axillary incision is very red with a rolled inferior edge and some yellow sloughy tissue above this.  PA is okay with healing status.  - incision does not pull open with PROM  PALPATION: firmness possible seroma vs scar tissue medial/inferior to incision not as palpable in the supine position  LYMPHEDEMA ASSESSMENT:   UPPER EXTREMITY AROM/PROM:                          (Blank rows = not tested)   A/PROM LEFT  04/03/2022 05/13/22  Shoulder extension 68   Shoulder flexion 160 163  Shoulder abduction 165 165  Shoulder internal rotation     Shoulder external rotation 100 95                          (Blank rows = not tested)   LYMPHEDEMA ASSESSMENTS:    LANDMARK RIGHT  04/03/2022  10  cm proximal to olecranon process    Olecranon process    10 cm proximal to ulnar styloid process    Just proximal to ulnar styloid process    Across hand at thumb web space    At base of 2nd digit    (Blank rows = not tested)   [facility] LEFT  04/03/2022 05/13/22  10 cm proximal to olecranon process 37 37  Olecranon process 29.'5 30  10 ' cm proximal to ulnar styloid process 24 25  Just proximal to ulnar styloid process 17.1 17.3  Across hand at thumb web space 19.6 19.4  At base of 2nd digit 6.5 6.6  (Blank rows = not tested)    PATIENT EDUCATION:  Education details: POC, briefly self MLD for the Left breast omitting inguinal steps for today Person educated: Patient Education method: Explanation, Demonstration, Tactile cues, Verbal cues, and Handouts Education comprehension: verbalized understanding, returned demonstration, verbal cues required, tactile cues required, and needs further education  HOME EXERCISE PROGRAM:  Reviewed previously given post op HEP.  Self MLD Lt breast without inguinal steps for now   TODAY'S TREATMENT  Pt permission and consent throughout each step of examination and treatment with modification and draping if requested when working on sensitive areas 06/11/22 Manual Therapy: MLD: With pt permission, In Supine: short neck, superficial and deep abdominals, bil axillary nodes and Lt inguinal nodes, anterior inter-axillary and Lt axillo-inguinal anastomosis, then focused on Lt breast, then into Rt S/L for further work to lateral breast and fibrotic techniques over area of seroma, then finished retracing steps in supine   06/09/22 Manual Therapy: MLD: With pt permission, In Supine: short neck, superficial and deep abdominals, bil axillary nodes and Lt inguinal nodes, anterior inter-axillary and Lt axillo-inguinal anastomosis,  then focused on Lt breast, then into Rt S/L for further work to lateral breast and fibrotic techniques over area of seroma, then finished  retracing steps in supine   6/723: Manual Therapy: MLD: With pt permission, In Supine: short neck, superficial and deep abdominals, Rt axillary nodes and Lt inguinal nodes, anterior inter-axillary and Lt axillo-inguinal anastomosis, then focused on Lt breast, then into Rt S/L for further work to lateral breast and fibrotic techniques over area of seroma that caused area to soften drastically, then finished retracing steps in supine   ASSESSMENT: CLINICAL IMPRESSION: Continued with MLD to L breast to help decrease swelling and continue to soften fibrosis. Reviewed MLD and compresison during and after radiation and to return to PT if it seems still swollen and heavy around 3 months post radiation.   Pt will benefit from skilled therapeutic intervention to improve on the following deficits: Decreased knowledge of precautions, impaired UE functional use, pain, decreased ROM, postural dysfunction.   PT treatment/interventions: ADL/Self care home management, Therapeutic exercises, Patient/Family education, Manual lymph drainage, Compression bandaging, Taping, Manual therapy, and Re-evaluation  GOALS: Goals reviewed with patient? Yes  LONG TERM GOALS:  (STG=LTG)  GOALS Name Target Date (Remove Blue Hyperlink) Goal status  1 Pt will demonstrate she has regained full shoulder ROM and function post operatively compared to baselines.  Baseline: 05/13/22 MET  2 Pt will be ind with self MLD for the left breast 07/02/22 MET  3 Pt will decrease heaviness of the breast by at least 50% 07/02/22 MET          PLAN: PT FREQUENCY/DURATION: 1-2x per week for 4 weeks  PLAN FOR NEXT SESSION: 1 more visit  -  Lt breast MLD and self MLD review     Shan Levans, PT  06/11/22 9:51 AM       Manual Lymph Drainage for Left Breast.   Do daily.  Do slowly. Use flat hands with just enough pressure to stretch the skin. Do not slide over the skin   (Stretch  Relax  Move) Lie down or sit comfortably (in a  recliner, for example) to do this.   Do circles at each collar bone near neck 5-7 times (to "wake up" lots of lymph nodes in this area).  Take slow deep breaths, allowing your belly to balloon out as you breathe in, 5x (to "wake up" abdominal lymph nodes).  Both armpits--stretch skin in small circles to stimulate intact lymph nodes there, 5-7x.  Redirect fluid from left chest toward right armpit (stretch skin starting at left chest in 3-4 spots working toward right armpit) 3-4x across the chest.  Draw an imaginary diagonal line from upper outer breast through the nipple area toward lower inner breast.  Direct fluid upward and inward from this line toward the pathway across your upper chest (established in #5).  Do this in three rows to treat all the upper inner breast and do each row 3-4x.  Then direct the fluid down and out from this line toward the pathway down your side going towards the left groin. Do this in three rows and do each row 3-4x. (established in #6)   Then repeat your pathways (#5 and #6)  End with repeating #3 and #4 above. (circles in both armpits and the left groin) PHYSICAL THERAPY DISCHARGE SUMMARY  Visits from Start of Care: 10  Current functional level related to goals / functional outcomes: See above   Remaining deficits: Breast edema, lymphedema risk   Education /  Equipment: Final HEP  Plan: Patient agrees to discharge. Patient is being discharged due to meeting the stated rehab goals.

## 2022-06-12 ENCOUNTER — Other Ambulatory Visit: Payer: Self-pay

## 2022-06-12 ENCOUNTER — Ambulatory Visit
Admission: RE | Admit: 2022-06-12 | Discharge: 2022-06-12 | Disposition: A | Discharge status: Home / Self Care | Payer: BC Managed Care – PPO | Source: Ambulatory Visit | Attending: Radiation Oncology | Admitting: Radiation Oncology

## 2022-06-12 DIAGNOSIS — C50412 Malignant neoplasm of upper-outer quadrant of left female breast: Secondary | ICD-10-CM | POA: Diagnosis not present

## 2022-06-12 DIAGNOSIS — Z170 Estrogen receptor positive status [ER+]: Secondary | ICD-10-CM | POA: Diagnosis not present

## 2022-06-12 DIAGNOSIS — Z510 Encounter for antineoplastic radiation therapy: Secondary | ICD-10-CM | POA: Diagnosis not present

## 2022-06-12 LAB — RAD ONC ARIA SESSION SUMMARY
Course Elapsed Days: 1
Plan Fractions Treated to Date: 2
Plan Prescribed Dose Per Fraction: 2.67 Gy
Plan Total Fractions Prescribed: 15
Plan Total Prescribed Dose: 40.05 Gy
Reference Point Dosage Given to Date: 5.34 Gy
Reference Point Session Dosage Given: 2.67 Gy
Session Number: 2

## 2022-06-13 ENCOUNTER — Other Ambulatory Visit: Payer: Self-pay

## 2022-06-13 ENCOUNTER — Ambulatory Visit
Admission: RE | Admit: 2022-06-13 | Discharge: 2022-06-13 | Disposition: A | Discharge status: Home / Self Care | Payer: BC Managed Care – PPO | Source: Ambulatory Visit | Attending: Radiation Oncology | Admitting: Radiation Oncology

## 2022-06-13 DIAGNOSIS — Z170 Estrogen receptor positive status [ER+]: Secondary | ICD-10-CM | POA: Diagnosis not present

## 2022-06-13 DIAGNOSIS — C50412 Malignant neoplasm of upper-outer quadrant of left female breast: Secondary | ICD-10-CM | POA: Diagnosis not present

## 2022-06-13 DIAGNOSIS — Z510 Encounter for antineoplastic radiation therapy: Secondary | ICD-10-CM | POA: Diagnosis not present

## 2022-06-13 LAB — RAD ONC ARIA SESSION SUMMARY
Course Elapsed Days: 2
Plan Fractions Treated to Date: 3
Plan Prescribed Dose Per Fraction: 2.67 Gy
Plan Total Fractions Prescribed: 15
Plan Total Prescribed Dose: 40.05 Gy
Reference Point Dosage Given to Date: 8.01 Gy
Reference Point Session Dosage Given: 2.67 Gy
Session Number: 3

## 2022-06-16 ENCOUNTER — Ambulatory Visit
Admission: RE | Admit: 2022-06-16 | Discharge: 2022-06-16 | Disposition: A | Discharge status: Home / Self Care | Payer: BC Managed Care – PPO | Source: Ambulatory Visit | Attending: Radiation Oncology | Admitting: Radiation Oncology

## 2022-06-16 ENCOUNTER — Other Ambulatory Visit: Payer: Self-pay

## 2022-06-16 ENCOUNTER — Ambulatory Visit: Payer: BC Managed Care – PPO

## 2022-06-16 DIAGNOSIS — Z170 Estrogen receptor positive status [ER+]: Secondary | ICD-10-CM | POA: Diagnosis not present

## 2022-06-16 DIAGNOSIS — C50412 Malignant neoplasm of upper-outer quadrant of left female breast: Secondary | ICD-10-CM | POA: Diagnosis not present

## 2022-06-16 DIAGNOSIS — Z510 Encounter for antineoplastic radiation therapy: Secondary | ICD-10-CM | POA: Diagnosis not present

## 2022-06-16 LAB — RAD ONC ARIA SESSION SUMMARY
Course Elapsed Days: 5
Plan Fractions Treated to Date: 4
Plan Prescribed Dose Per Fraction: 2.67 Gy
Plan Total Fractions Prescribed: 15
Plan Total Prescribed Dose: 40.05 Gy
Reference Point Dosage Given to Date: 10.68 Gy
Reference Point Session Dosage Given: 2.67 Gy
Session Number: 4

## 2022-06-16 MED ORDER — ALRA NON-METALLIC DEODORANT (RAD-ONC)
1.0000 | Freq: Once | TOPICAL | Status: AC
  Administered 2022-06-16: 1 via TOPICAL

## 2022-06-16 MED ORDER — RADIAPLEXRX EX GEL: Freq: Once | CUTANEOUS | Status: AC

## 2022-06-16 NOTE — Progress Notes (Signed)

## 2022-06-17 ENCOUNTER — Other Ambulatory Visit: Payer: Self-pay

## 2022-06-17 ENCOUNTER — Ambulatory Visit
Admission: RE | Admit: 2022-06-17 | Discharge: 2022-06-17 | Disposition: A | Discharge status: Home / Self Care | Payer: BC Managed Care – PPO | Source: Ambulatory Visit | Attending: Radiation Oncology | Admitting: Radiation Oncology

## 2022-06-17 DIAGNOSIS — Z510 Encounter for antineoplastic radiation therapy: Secondary | ICD-10-CM | POA: Diagnosis not present

## 2022-06-17 DIAGNOSIS — C50412 Malignant neoplasm of upper-outer quadrant of left female breast: Secondary | ICD-10-CM | POA: Diagnosis not present

## 2022-06-17 DIAGNOSIS — Z170 Estrogen receptor positive status [ER+]: Secondary | ICD-10-CM | POA: Diagnosis not present

## 2022-06-17 LAB — RAD ONC ARIA SESSION SUMMARY
Course Elapsed Days: 6
Plan Fractions Treated to Date: 5
Plan Prescribed Dose Per Fraction: 2.67 Gy
Plan Total Fractions Prescribed: 15
Plan Total Prescribed Dose: 40.05 Gy
Reference Point Dosage Given to Date: 13.35 Gy
Reference Point Session Dosage Given: 2.67 Gy
Session Number: 5

## 2022-06-18 ENCOUNTER — Ambulatory Visit
Admission: RE | Admit: 2022-06-18 | Discharge: 2022-06-18 | Disposition: A | Discharge status: Home / Self Care | Payer: BC Managed Care – PPO | Source: Ambulatory Visit | Attending: Radiation Oncology | Admitting: Radiation Oncology

## 2022-06-18 ENCOUNTER — Other Ambulatory Visit: Payer: Self-pay

## 2022-06-18 DIAGNOSIS — Z510 Encounter for antineoplastic radiation therapy: Secondary | ICD-10-CM | POA: Diagnosis not present

## 2022-06-18 DIAGNOSIS — C50412 Malignant neoplasm of upper-outer quadrant of left female breast: Secondary | ICD-10-CM | POA: Diagnosis not present

## 2022-06-18 DIAGNOSIS — Z170 Estrogen receptor positive status [ER+]: Secondary | ICD-10-CM | POA: Diagnosis not present

## 2022-06-18 LAB — RAD ONC ARIA SESSION SUMMARY
Course Elapsed Days: 7
Plan Fractions Treated to Date: 6
Plan Prescribed Dose Per Fraction: 2.67 Gy
Plan Total Fractions Prescribed: 15
Plan Total Prescribed Dose: 40.05 Gy
Reference Point Dosage Given to Date: 16.02 Gy
Reference Point Session Dosage Given: 2.67 Gy
Session Number: 6

## 2022-06-19 ENCOUNTER — Ambulatory Visit
Admission: RE | Admit: 2022-06-19 | Discharge: 2022-06-19 | Disposition: A | Discharge status: Home / Self Care | Payer: BC Managed Care – PPO | Source: Ambulatory Visit | Attending: Radiation Oncology | Admitting: Radiation Oncology

## 2022-06-19 ENCOUNTER — Other Ambulatory Visit: Payer: Self-pay

## 2022-06-19 DIAGNOSIS — Z170 Estrogen receptor positive status [ER+]: Secondary | ICD-10-CM | POA: Diagnosis not present

## 2022-06-19 DIAGNOSIS — Z510 Encounter for antineoplastic radiation therapy: Secondary | ICD-10-CM | POA: Diagnosis not present

## 2022-06-19 DIAGNOSIS — C50412 Malignant neoplasm of upper-outer quadrant of left female breast: Secondary | ICD-10-CM | POA: Diagnosis not present

## 2022-06-19 LAB — RAD ONC ARIA SESSION SUMMARY
Course Elapsed Days: 8
Plan Fractions Treated to Date: 7
Plan Prescribed Dose Per Fraction: 2.67 Gy
Plan Total Fractions Prescribed: 15
Plan Total Prescribed Dose: 40.05 Gy
Reference Point Dosage Given to Date: 18.69 Gy
Reference Point Session Dosage Given: 2.67 Gy
Session Number: 7

## 2022-06-20 ENCOUNTER — Other Ambulatory Visit: Payer: Self-pay

## 2022-06-20 ENCOUNTER — Ambulatory Visit
Admission: RE | Admit: 2022-06-20 | Discharge: 2022-06-20 | Disposition: A | Discharge status: Home / Self Care | Payer: BC Managed Care – PPO | Source: Ambulatory Visit | Attending: Radiation Oncology | Admitting: Radiation Oncology

## 2022-06-20 DIAGNOSIS — Z510 Encounter for antineoplastic radiation therapy: Secondary | ICD-10-CM | POA: Diagnosis not present

## 2022-06-20 DIAGNOSIS — Z170 Estrogen receptor positive status [ER+]: Secondary | ICD-10-CM | POA: Diagnosis not present

## 2022-06-20 DIAGNOSIS — C50412 Malignant neoplasm of upper-outer quadrant of left female breast: Secondary | ICD-10-CM | POA: Diagnosis not present

## 2022-06-20 LAB — RAD ONC ARIA SESSION SUMMARY
Course Elapsed Days: 9
Plan Fractions Treated to Date: 8
Plan Prescribed Dose Per Fraction: 2.67 Gy
Plan Total Fractions Prescribed: 15
Plan Total Prescribed Dose: 40.05 Gy
Reference Point Dosage Given to Date: 21.36 Gy
Reference Point Session Dosage Given: 2.67 Gy
Session Number: 8

## 2022-06-23 ENCOUNTER — Other Ambulatory Visit: Payer: Self-pay

## 2022-06-23 ENCOUNTER — Ambulatory Visit
Admission: RE | Admit: 2022-06-23 | Discharge: 2022-06-23 | Disposition: A | Discharge status: Home / Self Care | Payer: BC Managed Care – PPO | Source: Ambulatory Visit | Attending: Radiation Oncology | Admitting: Radiation Oncology

## 2022-06-23 DIAGNOSIS — Z510 Encounter for antineoplastic radiation therapy: Secondary | ICD-10-CM | POA: Diagnosis not present

## 2022-06-23 DIAGNOSIS — C50412 Malignant neoplasm of upper-outer quadrant of left female breast: Secondary | ICD-10-CM | POA: Diagnosis not present

## 2022-06-23 DIAGNOSIS — Z170 Estrogen receptor positive status [ER+]: Secondary | ICD-10-CM | POA: Diagnosis not present

## 2022-06-23 LAB — RAD ONC ARIA SESSION SUMMARY
Course Elapsed Days: 12
Plan Fractions Treated to Date: 9
Plan Prescribed Dose Per Fraction: 2.67 Gy
Plan Total Fractions Prescribed: 15
Plan Total Prescribed Dose: 40.05 Gy
Reference Point Dosage Given to Date: 24.03 Gy
Reference Point Session Dosage Given: 2.67 Gy
Session Number: 9

## 2022-06-24 ENCOUNTER — Ambulatory Visit
Admission: RE | Admit: 2022-06-24 | Discharge: 2022-06-24 | Disposition: A | Discharge status: Home / Self Care | Payer: BC Managed Care – PPO | Source: Ambulatory Visit | Attending: Radiation Oncology | Admitting: Radiation Oncology

## 2022-06-24 ENCOUNTER — Other Ambulatory Visit: Payer: Self-pay

## 2022-06-24 DIAGNOSIS — Z510 Encounter for antineoplastic radiation therapy: Secondary | ICD-10-CM | POA: Diagnosis not present

## 2022-06-24 DIAGNOSIS — Z170 Estrogen receptor positive status [ER+]: Secondary | ICD-10-CM | POA: Diagnosis not present

## 2022-06-24 DIAGNOSIS — C50412 Malignant neoplasm of upper-outer quadrant of left female breast: Secondary | ICD-10-CM | POA: Diagnosis not present

## 2022-06-24 LAB — RAD ONC ARIA SESSION SUMMARY
Course Elapsed Days: 13
Plan Fractions Treated to Date: 10
Plan Prescribed Dose Per Fraction: 2.67 Gy
Plan Total Fractions Prescribed: 15
Plan Total Prescribed Dose: 40.05 Gy
Reference Point Dosage Given to Date: 26.7 Gy
Reference Point Session Dosage Given: 2.67 Gy
Session Number: 10

## 2022-06-25 ENCOUNTER — Other Ambulatory Visit: Payer: Self-pay

## 2022-06-25 ENCOUNTER — Ambulatory Visit
Admission: RE | Admit: 2022-06-25 | Discharge: 2022-06-25 | Disposition: A | Discharge status: Home / Self Care | Payer: BC Managed Care – PPO | Source: Ambulatory Visit | Attending: Radiation Oncology | Admitting: Radiation Oncology

## 2022-06-25 DIAGNOSIS — Z170 Estrogen receptor positive status [ER+]: Secondary | ICD-10-CM | POA: Diagnosis not present

## 2022-06-25 DIAGNOSIS — C50412 Malignant neoplasm of upper-outer quadrant of left female breast: Secondary | ICD-10-CM | POA: Diagnosis not present

## 2022-06-25 DIAGNOSIS — Z510 Encounter for antineoplastic radiation therapy: Secondary | ICD-10-CM | POA: Diagnosis not present

## 2022-06-25 LAB — RAD ONC ARIA SESSION SUMMARY
Course Elapsed Days: 14
Plan Fractions Treated to Date: 11
Plan Prescribed Dose Per Fraction: 2.67 Gy
Plan Total Fractions Prescribed: 15
Plan Total Prescribed Dose: 40.05 Gy
Reference Point Dosage Given to Date: 29.37 Gy
Reference Point Session Dosage Given: 2.67 Gy
Session Number: 11

## 2022-06-26 ENCOUNTER — Ambulatory Visit
Admission: RE | Admit: 2022-06-26 | Discharge: 2022-06-26 | Disposition: A | Discharge status: Home / Self Care | Payer: BC Managed Care – PPO | Source: Ambulatory Visit | Attending: Radiation Oncology | Admitting: Radiation Oncology

## 2022-06-26 ENCOUNTER — Other Ambulatory Visit: Payer: Self-pay

## 2022-06-26 DIAGNOSIS — C50412 Malignant neoplasm of upper-outer quadrant of left female breast: Secondary | ICD-10-CM | POA: Diagnosis not present

## 2022-06-26 DIAGNOSIS — Z510 Encounter for antineoplastic radiation therapy: Secondary | ICD-10-CM | POA: Diagnosis not present

## 2022-06-26 DIAGNOSIS — Z170 Estrogen receptor positive status [ER+]: Secondary | ICD-10-CM | POA: Diagnosis not present

## 2022-06-26 LAB — RAD ONC ARIA SESSION SUMMARY
Course Elapsed Days: 15
Plan Fractions Treated to Date: 12
Plan Prescribed Dose Per Fraction: 2.67 Gy
Plan Total Fractions Prescribed: 15
Plan Total Prescribed Dose: 40.05 Gy
Reference Point Dosage Given to Date: 32.04 Gy
Reference Point Session Dosage Given: 2.67 Gy
Session Number: 12

## 2022-06-27 ENCOUNTER — Other Ambulatory Visit: Payer: Self-pay

## 2022-06-27 ENCOUNTER — Ambulatory Visit
Admission: RE | Admit: 2022-06-27 | Discharge: 2022-06-27 | Disposition: A | Discharge status: Home / Self Care | Payer: BC Managed Care – PPO | Source: Ambulatory Visit | Attending: Radiation Oncology | Admitting: Radiation Oncology

## 2022-06-27 DIAGNOSIS — Z510 Encounter for antineoplastic radiation therapy: Secondary | ICD-10-CM | POA: Diagnosis not present

## 2022-06-27 DIAGNOSIS — C50412 Malignant neoplasm of upper-outer quadrant of left female breast: Secondary | ICD-10-CM | POA: Diagnosis not present

## 2022-06-27 DIAGNOSIS — Z170 Estrogen receptor positive status [ER+]: Secondary | ICD-10-CM | POA: Diagnosis not present

## 2022-06-27 LAB — RAD ONC ARIA SESSION SUMMARY
Course Elapsed Days: 16
Plan Fractions Treated to Date: 13
Plan Prescribed Dose Per Fraction: 2.67 Gy
Plan Total Fractions Prescribed: 15
Plan Total Prescribed Dose: 40.05 Gy
Reference Point Dosage Given to Date: 34.71 Gy
Reference Point Session Dosage Given: 2.67 Gy
Session Number: 13

## 2022-06-30 ENCOUNTER — Ambulatory Visit: Payer: BC Managed Care – PPO

## 2022-06-30 ENCOUNTER — Ambulatory Visit
Admission: RE | Admit: 2022-06-30 | Discharge: 2022-06-30 | Disposition: A | Discharge status: Home / Self Care | Payer: BC Managed Care – PPO | Source: Ambulatory Visit | Attending: Radiation Oncology | Admitting: Radiation Oncology

## 2022-06-30 ENCOUNTER — Other Ambulatory Visit: Payer: Self-pay

## 2022-06-30 DIAGNOSIS — C50412 Malignant neoplasm of upper-outer quadrant of left female breast: Secondary | ICD-10-CM | POA: Diagnosis not present

## 2022-06-30 DIAGNOSIS — Z510 Encounter for antineoplastic radiation therapy: Secondary | ICD-10-CM | POA: Diagnosis not present

## 2022-06-30 DIAGNOSIS — Z170 Estrogen receptor positive status [ER+]: Secondary | ICD-10-CM | POA: Insufficient documentation

## 2022-06-30 LAB — RAD ONC ARIA SESSION SUMMARY
Course Elapsed Days: 19
Plan Fractions Treated to Date: 14
Plan Prescribed Dose Per Fraction: 2.67 Gy
Plan Total Fractions Prescribed: 15
Plan Total Prescribed Dose: 40.05 Gy
Reference Point Dosage Given to Date: 37.38 Gy
Reference Point Session Dosage Given: 2.67 Gy
Session Number: 14

## 2022-06-30 MED ORDER — RADIAPLEXRX EX GEL: Freq: Once | CUTANEOUS | Status: AC

## 2022-07-02 ENCOUNTER — Other Ambulatory Visit: Payer: Self-pay

## 2022-07-02 ENCOUNTER — Ambulatory Visit
Admission: RE | Admit: 2022-07-02 | Discharge: 2022-07-02 | Disposition: A | Discharge status: Home / Self Care | Payer: BC Managed Care – PPO | Source: Ambulatory Visit | Attending: Radiation Oncology | Admitting: Radiation Oncology

## 2022-07-02 DIAGNOSIS — Z170 Estrogen receptor positive status [ER+]: Secondary | ICD-10-CM | POA: Diagnosis not present

## 2022-07-02 DIAGNOSIS — C50412 Malignant neoplasm of upper-outer quadrant of left female breast: Secondary | ICD-10-CM | POA: Diagnosis not present

## 2022-07-02 DIAGNOSIS — Z510 Encounter for antineoplastic radiation therapy: Secondary | ICD-10-CM | POA: Diagnosis not present

## 2022-07-02 LAB — RAD ONC ARIA SESSION SUMMARY
Course Elapsed Days: 21
Plan Fractions Treated to Date: 15
Plan Prescribed Dose Per Fraction: 2.67 Gy
Plan Total Fractions Prescribed: 15
Plan Total Prescribed Dose: 40.05 Gy
Reference Point Dosage Given to Date: 40.05 Gy
Reference Point Session Dosage Given: 2.67 Gy
Session Number: 15

## 2022-07-03 ENCOUNTER — Ambulatory Visit
Admission: RE | Admit: 2022-07-03 | Discharge: 2022-07-03 | Disposition: A | Discharge status: Home / Self Care | Payer: BC Managed Care – PPO | Source: Ambulatory Visit | Attending: Radiation Oncology | Admitting: Radiation Oncology

## 2022-07-03 ENCOUNTER — Other Ambulatory Visit: Payer: Self-pay

## 2022-07-03 DIAGNOSIS — Z170 Estrogen receptor positive status [ER+]: Secondary | ICD-10-CM | POA: Diagnosis not present

## 2022-07-03 DIAGNOSIS — C50412 Malignant neoplasm of upper-outer quadrant of left female breast: Secondary | ICD-10-CM | POA: Diagnosis not present

## 2022-07-03 DIAGNOSIS — Z510 Encounter for antineoplastic radiation therapy: Secondary | ICD-10-CM | POA: Diagnosis not present

## 2022-07-03 LAB — RAD ONC ARIA SESSION SUMMARY
Course Elapsed Days: 22
Plan Fractions Treated to Date: 1
Plan Prescribed Dose Per Fraction: 2 Gy
Plan Total Fractions Prescribed: 5
Plan Total Prescribed Dose: 10 Gy
Reference Point Dosage Given to Date: 42.05 Gy
Reference Point Session Dosage Given: 2 Gy
Session Number: 16

## 2022-07-04 ENCOUNTER — Other Ambulatory Visit: Payer: Self-pay

## 2022-07-04 ENCOUNTER — Ambulatory Visit
Admission: RE | Admit: 2022-07-04 | Discharge: 2022-07-04 | Disposition: A | Discharge status: Home / Self Care | Payer: BC Managed Care – PPO | Source: Ambulatory Visit | Attending: Radiation Oncology | Admitting: Radiation Oncology

## 2022-07-04 DIAGNOSIS — Z510 Encounter for antineoplastic radiation therapy: Secondary | ICD-10-CM | POA: Diagnosis not present

## 2022-07-04 DIAGNOSIS — C50412 Malignant neoplasm of upper-outer quadrant of left female breast: Secondary | ICD-10-CM | POA: Diagnosis not present

## 2022-07-04 DIAGNOSIS — Z170 Estrogen receptor positive status [ER+]: Secondary | ICD-10-CM | POA: Diagnosis not present

## 2022-07-04 LAB — RAD ONC ARIA SESSION SUMMARY
Course Elapsed Days: 23
Plan Fractions Treated to Date: 2
Plan Prescribed Dose Per Fraction: 2 Gy
Plan Total Fractions Prescribed: 5
Plan Total Prescribed Dose: 10 Gy
Reference Point Dosage Given to Date: 44.05 Gy
Reference Point Session Dosage Given: 2 Gy
Session Number: 17

## 2022-07-07 ENCOUNTER — Other Ambulatory Visit: Payer: Self-pay

## 2022-07-07 ENCOUNTER — Ambulatory Visit
Admission: RE | Admit: 2022-07-07 | Discharge: 2022-07-07 | Disposition: A | Discharge status: Home / Self Care | Payer: BC Managed Care – PPO | Source: Ambulatory Visit | Attending: Radiation Oncology | Admitting: Radiation Oncology

## 2022-07-07 DIAGNOSIS — Z170 Estrogen receptor positive status [ER+]: Secondary | ICD-10-CM | POA: Diagnosis not present

## 2022-07-07 DIAGNOSIS — C50412 Malignant neoplasm of upper-outer quadrant of left female breast: Secondary | ICD-10-CM | POA: Diagnosis not present

## 2022-07-07 DIAGNOSIS — Z510 Encounter for antineoplastic radiation therapy: Secondary | ICD-10-CM | POA: Diagnosis not present

## 2022-07-07 LAB — RAD ONC ARIA SESSION SUMMARY
Course Elapsed Days: 26
Plan Fractions Treated to Date: 3
Plan Prescribed Dose Per Fraction: 2 Gy
Plan Total Fractions Prescribed: 5
Plan Total Prescribed Dose: 10 Gy
Reference Point Dosage Given to Date: 46.05 Gy
Reference Point Session Dosage Given: 2 Gy
Session Number: 18

## 2022-07-08 ENCOUNTER — Ambulatory Visit
Admission: RE | Admit: 2022-07-08 | Discharge: 2022-07-08 | Disposition: A | Discharge status: Home / Self Care | Payer: BC Managed Care – PPO | Source: Ambulatory Visit | Attending: Radiation Oncology | Admitting: Radiation Oncology

## 2022-07-08 ENCOUNTER — Encounter: Payer: Self-pay | Admitting: *Deleted

## 2022-07-08 ENCOUNTER — Other Ambulatory Visit: Payer: Self-pay

## 2022-07-08 DIAGNOSIS — Z170 Estrogen receptor positive status [ER+]: Secondary | ICD-10-CM | POA: Diagnosis not present

## 2022-07-08 DIAGNOSIS — C50412 Malignant neoplasm of upper-outer quadrant of left female breast: Secondary | ICD-10-CM | POA: Diagnosis not present

## 2022-07-08 DIAGNOSIS — Z510 Encounter for antineoplastic radiation therapy: Secondary | ICD-10-CM | POA: Diagnosis not present

## 2022-07-08 LAB — RAD ONC ARIA SESSION SUMMARY
Course Elapsed Days: 27
Plan Fractions Treated to Date: 4
Plan Prescribed Dose Per Fraction: 2 Gy
Plan Total Fractions Prescribed: 5
Plan Total Prescribed Dose: 10 Gy
Reference Point Dosage Given to Date: 48.05 Gy
Reference Point Session Dosage Given: 2 Gy
Session Number: 19

## 2022-07-09 ENCOUNTER — Other Ambulatory Visit: Payer: Self-pay

## 2022-07-09 ENCOUNTER — Ambulatory Visit
Admission: RE | Admit: 2022-07-09 | Discharge: 2022-07-09 | Disposition: A | Discharge status: Home / Self Care | Payer: BC Managed Care – PPO | Source: Ambulatory Visit | Attending: Radiation Oncology | Admitting: Radiation Oncology

## 2022-07-09 ENCOUNTER — Encounter: Payer: Self-pay | Admitting: Radiation Oncology

## 2022-07-09 DIAGNOSIS — Z510 Encounter for antineoplastic radiation therapy: Secondary | ICD-10-CM | POA: Diagnosis not present

## 2022-07-09 DIAGNOSIS — C50412 Malignant neoplasm of upper-outer quadrant of left female breast: Secondary | ICD-10-CM | POA: Diagnosis not present

## 2022-07-09 DIAGNOSIS — Z170 Estrogen receptor positive status [ER+]: Secondary | ICD-10-CM | POA: Diagnosis not present

## 2022-07-09 LAB — RAD ONC ARIA SESSION SUMMARY
Course Elapsed Days: 28
Plan Fractions Treated to Date: 5
Plan Prescribed Dose Per Fraction: 2 Gy
Plan Total Fractions Prescribed: 5
Plan Total Prescribed Dose: 10 Gy
Reference Point Dosage Given to Date: 50.05 Gy
Reference Point Session Dosage Given: 2 Gy
Session Number: 20

## 2022-07-10 ENCOUNTER — Telehealth: Payer: Self-pay

## 2022-07-10 NOTE — Telephone Encounter (Signed)
Patient left VM concerned about raw/sensitive area that has started to develop in axilla over lumpectomy incision since finishing radiation yesterday. Requested return call for advice   Returned patient's call to let her know that symptoms are expected, and will continue to progress for the next 2 weeks. Encouraged patient to avoid wearing constrictive clothing, and to try and allow cool air to blow on breast and irritated areas as much as possible. Advised her to apply neosporin to any areas of peeling or shallow desquamation (and continue applying Radiaplex to intact skin). Encouraged her to call me back early next week if she felt her symptoms were not manageable or seemed to be worsening.   Patient verbalized understanding and agreement. She has my direct number for future needs

## 2022-07-14 ENCOUNTER — Ambulatory Visit: Payer: BC Managed Care – PPO | Attending: Surgery

## 2022-07-14 VITALS — Wt 233.1 lb

## 2022-07-14 DIAGNOSIS — Z170 Estrogen receptor positive status [ER+]: Secondary | ICD-10-CM | POA: Insufficient documentation

## 2022-07-14 DIAGNOSIS — I890 Lymphedema, not elsewhere classified: Secondary | ICD-10-CM | POA: Insufficient documentation

## 2022-07-14 DIAGNOSIS — C50412 Malignant neoplasm of upper-outer quadrant of left female breast: Secondary | ICD-10-CM | POA: Insufficient documentation

## 2022-07-14 DIAGNOSIS — Z483 Aftercare following surgery for neoplasm: Secondary | ICD-10-CM | POA: Insufficient documentation

## 2022-07-14 DIAGNOSIS — R293 Abnormal posture: Secondary | ICD-10-CM | POA: Insufficient documentation

## 2022-07-14 NOTE — Therapy (Addendum)
OUTPATIENT PHYSICAL THERAPY SOZO SCREENING NOTE   Patient Name: Caitlin [name] MRN: [id number] DOB:Jan 29, 1971, 51 y.o., female Today's Date: 07/14/2022  PCP: [name], MD REFERRING PROVIDER: [name], MD   PT End of Session - 07/14/22 0905     Visit Number 10   # unchanged due to screen only   PT Start Time 0903    PT Stop Time 0908    PT Time Calculation (min) 5 min    Activity Tolerance Patient tolerated treatment well    Behavior During Therapy WFL for tasks assessed/performed             Past Medical History:  Diagnosis Date   Anxiety    Back pain    Edema of both lower extremities    Elevated cholesterol    Endometriosis    Endometriosis    Family history of kidney cancer    Family history of ovarian cancer    Gallbladder problem    High cholesterol    History of IBS    IBS (irritable bowel syndrome)    Infertility, female    Vitamin D deficiency    Past Surgical History:  Procedure Laterality Date   BREAST LUMPECTOMY WITH RADIOACTIVE SEED AND SENTINEL LYMPH NODE BIOPSY Left 04/22/2022   Procedure: LEFT BREAST LUMPECTOMY WITH RADIOACTIVE SEED;  Surgeon: Coralie Keens, MD;  Location: [place];  Service: General;  Laterality: Left;  LMA   CHOLECYSTECTOMY  2018   LAPAROSCOPY     SENTINEL NODE BIOPSY Left 04/22/2022   Procedure: LEFT SENTINEL LYMPH NODE BIOPSY;  Surgeon: Coralie Keens, MD;  Location: [place];  Service: General;  Laterality: Left;   Patient Active Problem List   Diagnosis Date Noted   Genetic testing 04/29/2022   Family history of ovarian cancer 04/15/2022   Family history of kidney cancer 04/15/2022   Malignant neoplasm of upper-outer quadrant of left breast in female, estrogen receptor positive ([place]) 04/02/2022   Vitamin D insufficiency 02/18/2021   Elevated cholesterol 02/18/2021   Class 1 obesity due to excess calories with body mass index (BMI) of 34.0 to 34.9 in adult 02/07/2021   Irritable bowel syndrome  02/07/2021    REFERRING DIAG: left breast cancer at risk for lymphedema  THERAPY DIAG: Aftercare following surgery for neoplasm  PERTINENT HISTORY: Patient was diagnosed with left IDC. ER/PR positive, HER2 negative KI67 less than 5%. Pt had left lumpectomy and SLNB on 04/22/22 with Dr. [name]. Radiation. No chemotherapy.  Starts radiation 06/11/22.   PRECAUTIONS: left UE Lymphedema risk, None  SUBJECTIVE: Pt returns for her 3 month L-Dex screen. "My breast is more swollen since finishing radiation. I'm coming back for more visits starting next week. "  PAIN:  Are you having pain? No  SOZO SCREENING: Patient was assessed today using the SOZO machine to determine the lymphedema index score. This was compared to her baseline score. It was determined that she is within the recommended range when compared to her baseline and no further action is needed at this time. She will continue SOZO screenings. These are done every 3 months for 2 years post operatively followed by every 6 months for 2 years, and then annually.   L-DEX FLOWSHEETS - 07/14/22 1600       L-DEX LYMPHEDEMA SCREENING   Measurement Type Unilateral    L-DEX MEASUREMENT EXTREMITY Upper Extremity    POSITION  Standing    DOMINANT SIDE Right    At Risk Side Left    BASELINE  SCORE (UNILATERAL) -2.6    L-DEX SCORE (UNILATERAL) -2.8    VALUE CHANGE (UNILAT) -0.2               Otelia Limes, PTA 07/14/2022, 9:07 AM

## 2022-07-15 ENCOUNTER — Ambulatory Visit: Payer: BC Managed Care – PPO | Admitting: Hematology and Oncology

## 2022-07-22 ENCOUNTER — Encounter: Payer: Self-pay | Admitting: Hematology and Oncology

## 2022-07-22 ENCOUNTER — Inpatient Hospital Stay: Payer: BC Managed Care – PPO | Attending: Hematology and Oncology | Admitting: Hematology and Oncology

## 2022-07-22 ENCOUNTER — Other Ambulatory Visit: Payer: Self-pay

## 2022-07-22 DIAGNOSIS — Z170 Estrogen receptor positive status [ER+]: Secondary | ICD-10-CM | POA: Diagnosis not present

## 2022-07-22 DIAGNOSIS — K589 Irritable bowel syndrome without diarrhea: Secondary | ICD-10-CM | POA: Insufficient documentation

## 2022-07-22 DIAGNOSIS — Z8051 Family history of malignant neoplasm of kidney: Secondary | ICD-10-CM | POA: Insufficient documentation

## 2022-07-22 DIAGNOSIS — Z79899 Other long term (current) drug therapy: Secondary | ICD-10-CM | POA: Diagnosis not present

## 2022-07-22 DIAGNOSIS — C50412 Malignant neoplasm of upper-outer quadrant of left female breast: Secondary | ICD-10-CM | POA: Diagnosis not present

## 2022-07-22 DIAGNOSIS — N809 Endometriosis, unspecified: Secondary | ICD-10-CM | POA: Diagnosis not present

## 2022-07-22 DIAGNOSIS — E7800 Pure hypercholesterolemia, unspecified: Secondary | ICD-10-CM | POA: Diagnosis not present

## 2022-07-22 DIAGNOSIS — E559 Vitamin D deficiency, unspecified: Secondary | ICD-10-CM | POA: Diagnosis not present

## 2022-07-22 DIAGNOSIS — Z8041 Family history of malignant neoplasm of ovary: Secondary | ICD-10-CM | POA: Diagnosis not present

## 2022-07-22 MED ORDER — TAMOXIFEN CITRATE 20 MG PO TABS: 20.0000 mg | ORAL_TABLET | Freq: Every day | ORAL | 3 refills | Status: DC

## 2022-07-22 NOTE — Progress Notes (Signed)
[place] NOTE  Patient Care Team: [name], MD as PCP - General (Family Medicine) Mauro Kaufmann, RN as Oncology Nurse Navigator Rockwell Germany, RN as Oncology Nurse Navigator  CHIEF COMPLAINTS/PURPOSE OF CONSULTATION:  Newly diagnosed breast cancer  HISTORY OF PRESENTING ILLNESS:  Caitlin [name] 51 y.o. female is here because of recent diagnosis of left breast cancer.  I reviewed her records extensively and collaborated the history with the patient.  SUMMARY OF ONCOLOGIC HISTORY: Oncology History  Malignant neoplasm of upper-outer quadrant of left breast in female, estrogen receptor positive ([place])  03/10/2022 Mammogram   Mammogram of the left breast showed irregular hypoechoic mass in the left breast.  Targeted ultrasound performed also confirmed an irregular hypoechoic mass in the left breast at 2:00 2 cm from the nipple measuring 1.1 x 0.8 x 0.5 cm.  Left axilla did not show any enlarged lymphadenopathy.   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  Prognostic showed ER 95% positive moderate staining.  PR 100% positive strong staining.  HER2 equivocal by IHC.  FISH negative    04/02/2022 Initial Diagnosis   Malignant neoplasm of upper-outer quadrant of left breast in female, estrogen receptor positive ([place])   04/22/2022 Surgery   She had left breast lumpectomy on April 25 and pathology showed invasive ductal carcinoma [place] grade 2 out of 3, 1.0 cm, DCIS, intermediate grade, margins uninvolved by carcinoma at 3 out of 3 sentinel lymph nodes without any evidence of micrometastasis or micrometastasis.  Prognostics on prior biopsy showed ER +95% ER +100% staining HER2 negative, KI of less than 5%   04/25/2022 Genetic Testing   Negative genetic testing on the CancerNext-Expanded+RNainsight panel.  PALB2 c.94C>G  VUS identified. The report date is April 25, 2022.  The CancerNext-Expanded gene panel offered by [facility] and includes sequencing and rearrangement analysis for the following 77 genes: AIP, ALK, APC*, ATM*, AXIN2, BAP1, BARD1, BLM, BMPR1A, BRCA1*, BRCA2*, BRIP1*, CDC73, CDH1*, CDK4, CDKN1B, CDKN2A, CHEK2*, CTNNA1, DICER1, FANCC, FH, FLCN, GALNT12, KIF1B, LZTR1, MAX, MEN1, MET, MLH1*, MSH2*, MSH3, MSH6*, MUTYH*, NBN, NF1*, NF2, NTHL1, PALB2*, PHOX2B, PMS2*, POT1, PRKAR1A, PTCH1, PTEN*, RAD51C*, RAD51D*, RB1, RECQL, RET, SDHA, SDHAF2, SDHB, SDHC, SDHD, SMAD4, SMARCA4, SMARCB1, SMARCE1, STK11, SUFU, TMEM127, TP53*, TSC1, TSC2, VHL and XRCC2 (sequencing and deletion/duplication); EGFR, EGLN1, HOXB13, KIT, MITF, PDGFRA, POLD1, and POLE (sequencing only); EPCAM and GREM1 (deletion/duplication only). DNA and RNA analyses performed for * genes.    05/07/2022 Oncotype testing   Oncotype recurrence score of 16, distant recurrence risk at 9 years is 4%, group average absolute chemotherapy benefit less than 1%   07/22/2022 Cancer Staging   Staging form: Breast, AJCC 8th Edition - Clinical: Stage IA (cT1c, cN0, cM0, G2, ER+, PR+, HER2-) - Signed by [name], MD on 07/22/2022 Histologic grading system: 3 grade system    Interval history  Caitlin [name] is here for a follow up with her husband.   She is now undergoing adjuvant radiation..  She is here to discuss antiestrogen therapy She is healing well from side effects of radiation, some ongoing erythema otherwise doing well.  Rest of the pertinent 10 point ROS reviewed and negative  MEDICAL HISTORY:  Past Medical History:  Diagnosis Date   Anxiety    Back pain    Edema of both lower extremities    Elevated cholesterol  Endometriosis    Endometriosis    Family history of kidney cancer    Family history of ovarian cancer    Gallbladder problem    High cholesterol    History of IBS    IBS (irritable bowel syndrome)    Infertility, female     Vitamin D deficiency     SURGICAL HISTORY: Past Surgical History:  Procedure Laterality Date   BREAST LUMPECTOMY WITH RADIOACTIVE SEED AND SENTINEL LYMPH NODE BIOPSY Left 04/22/2022   Procedure: LEFT BREAST LUMPECTOMY WITH RADIOACTIVE SEED;  Surgeon: Coralie Keens, MD;  Location: [place];  Service: General;  Laterality: Left;  LMA   CHOLECYSTECTOMY  2018   LAPAROSCOPY     SENTINEL NODE BIOPSY Left 04/22/2022   Procedure: LEFT SENTINEL LYMPH NODE BIOPSY;  Surgeon: Coralie Keens, MD;  Location: [place];  Service: General;  Laterality: Left;    SOCIAL HISTORY: Social History   Socioeconomic History   Marital status: Married    Spouse name: Gerald Stabs   Number of children: 3   Years of education: Not on file   Highest education level: Not on file  Occupational History   Occupation: English as a second language teacher  Tobacco Use   Smoking status: Never   Smokeless tobacco: Never  Vaping Use   Vaping Use: Never used  Substance and Sexual Activity   Alcohol use: No   Drug use: No   Sexual activity: Yes    Birth control/protection: None  Other Topics Concern   Not on file  Social History Narrative   Right Handed   Two Story Home   Drinks Caffeine Occasionally    Social Determinants of Health   Financial Resource Strain: Low Risk  (05/01/2022)   Overall Financial Resource Strain (CARDIA)    Difficulty of Paying Living Expenses: Not hard at all  Food Insecurity: No Food Insecurity (05/01/2022)   Hunger Vital Sign    Worried About Running Out of Food in the Last Year: Never true    [place] in the Last Year: Never true  Transportation Needs: No Transportation Needs (05/01/2022)   PRAPARE - Hydrologist (Medical): No    Lack of Transportation (Non-Medical): No  Physical Activity: Not on file  Stress: Not on file  Social Connections: Not on file  Intimate Partner Violence: Not on file    FAMILY HISTORY: Family History  Problem Relation Age of Onset   Thyroid  disease Mother    Depression Mother    Anxiety disorder Mother    Obesity Mother    Hypertension Father    Heart disease Father    Heart disease Maternal Aunt    Diabetes Maternal Aunt    Kidney cancer Maternal Aunt 72   Rheum arthritis Paternal Aunt    Heart disease Maternal Grandmother    COPD Maternal Grandmother    Heart disease Maternal Grandfather    Ovarian cancer Paternal Grandmother        dx in her 71s   Cancer Cousin        unknown cancer in mat first cousin    ALLERGIES:  is allergic to ciprofloxacin and sulfa antibiotics.  MEDICATIONS:  Current Outpatient Medications  Medication Sig Dispense Refill   tamoxifen (NOLVADEX) 20 MG tablet Take 1 tablet (20 mg total) by mouth daily. 90 tablet 3   furosemide (LASIX) 20 MG tablet TAKE ONE TABLET BY MOUTH DAILY 30 tablet 6   VITAMIN D, CHOLECALCIFEROL, PO Take 1 tablet by mouth daily.  No current facility-administered medications for this visit.    REVIEW OF SYSTEMS:   Constitutional: Denies fevers, chills or abnormal night sweats Eyes: Denies blurriness of vision, double vision or watery eyes Ears, nose, mouth, throat, and face: Denies mucositis or sore throat Respiratory: Denies cough, dyspnea or wheezes Cardiovascular: Denies palpitation, chest discomfort or lower extremity swelling Gastrointestinal:  Denies nausea, heartburn or change in bowel habits Skin: Denies abnormal skin rashes Lymphatics: Denies new lymphadenopathy or easy bruising Neurological:Denies numbness, tingling or new weaknesses Behavioral/Psych: Mood is stable, no new changes  Breast: Denies any palpable lumps or discharge All other systems were reviewed with the patient and are negative.  PHYSICAL EXAMINATION: ECOG PERFORMANCE STATUS: 0 - Asymptomatic  Vitals:   07/22/22 0839  BP: (!) 126/91  Pulse: 95  Resp: 17  Temp: (!) 97.5 F (36.4 C)  SpO2: 98%     Filed Weights   07/22/22 0839  Weight: 234 lb 3.2 oz (106.2 kg)     Physical Exam Constitutional:      Appearance: Normal appearance.  Chest:     Comments: Left breast with postradiation changes. Musculoskeletal:     Cervical back: Normal range of motion and neck supple. No rigidity.  Lymphadenopathy:     Cervical: No cervical adenopathy.  Neurological:     Mental Status: She is alert.      LABORATORY DATA:  I have reviewed the data as listed Lab Results  Component Value Date   WBC 8.7 04/15/2022   HGB 14.0 04/15/2022   HCT 42.2 04/15/2022   MCV 90.6 04/15/2022   PLT 282 04/15/2022   Lab Results  Component Value Date   NA 138 04/15/2022   K 3.6 04/15/2022   CL 104 04/15/2022   CO2 27 04/15/2022    RADIOGRAPHIC STUDIES: I have personally reviewed the radiological reports and agreed with the findings in the report.  ASSESSMENT AND PLAN:  Malignant neoplasm of upper-outer quadrant of left breast in female, estrogen receptor positive ([place]) This is a very pleasant 51 year old premenopausal female patient with newly diagnosed left breast invasive ductal carcinoma, ER/PR positive, HER2 negative referred to medical oncology for recommendations. She underwent lumpectomy followed by Oncotype DX testing.  Final pathology showed a 1.2 cm tumor, grade 2, margins uninvolved, 3 out of 3 sentinel lymph nodes negative.  Prognostics on prior biopsy showed ER 95% positive, PR 100% staining, HER2 negative and KI of 5% Oncotype DX showed a recurrence score of 16, distant recurrence risk at 9 years of 4% with antiestrogen therapy.  There appears to be no significant chemotherapy benefit in this group  She is now undergoing adjuvant radiation.  She is here to discuss antiestrogen therapy.  We once again discussed antiestrogen therapy options such as tamoxifen versus tamoxifen with ovarian suppression versus ovarian suppression with aromatase inhibitor since she is premenopausal.   We discussed about proceeding with tamoxifen for adjuvant endocrine therapy  since she does not have any high risk features.   We once again discussed about side effects with tamoxifen, mechanism of action, incidence of DVT/PE, endometrial cancers, postmenopausal symptoms such as hot flashes.  She understands the benefit of tamoxifen and bone density.  Prescription dispensed to the pharmacy of her choice.  She has some underlying endometriosis hence asked to coordinate care with her OB/GYN to prevent any endometrial complications.    Return to clinic in 3 months with survivorship clinic in 6 months with [place].     Total time spent: 30 minutes All  questions were answered. The patient knows to call the clinic with any problems, questions or concerns.    [name], MD 07/22/22

## 2022-07-22 NOTE — Assessment & Plan Note (Addendum)
This is a very pleasant 51 year old premenopausal female patient with newly diagnosed left breast invasive ductal carcinoma, ER/PR positive, HER2 negative referred to medical oncology for recommendations. She underwent lumpectomy followed by Oncotype DX testing.  Final pathology showed a 1.2 cm tumor, grade 2, margins uninvolved, 3 out of 3 sentinel lymph nodes negative.  Prognostics on prior biopsy showed ER 95% positive, PR 100% staining, HER2 negative and KI of 5% Oncotype DX showed a recurrence score of 16, distant recurrence risk at 9 years of 4% with antiestrogen therapy.  There appears to be no significant chemotherapy benefit in this group  She is now undergoing adjuvant radiation.  She is here to discuss antiestrogen therapy.  We once again discussed antiestrogen therapy options such as tamoxifen versus tamoxifen with ovarian suppression versus ovarian suppression with aromatase inhibitor since she is premenopausal.   We discussed about proceeding with tamoxifen for adjuvant endocrine therapy since she does not have any high risk features.   We once again discussed about side effects with tamoxifen, mechanism of action, incidence of DVT/PE, endometrial cancers, postmenopausal symptoms such as hot flashes.  She understands the benefit of tamoxifen and bone density.  Prescription dispensed to the pharmacy of her choice.  She has some underlying endometriosis hence asked to coordinate care with her OB/GYN to prevent any endometrial complications.    Return to clinic in 3 months with survivorship clinic in 6 months with [place].

## 2022-07-24 ENCOUNTER — Encounter: Payer: Self-pay | Admitting: Rehabilitation

## 2022-07-24 ENCOUNTER — Ambulatory Visit: Payer: BC Managed Care – PPO | Admitting: Rehabilitation

## 2022-07-24 DIAGNOSIS — Z483 Aftercare following surgery for neoplasm: Secondary | ICD-10-CM

## 2022-07-24 DIAGNOSIS — I890 Lymphedema, not elsewhere classified: Secondary | ICD-10-CM

## 2022-07-24 DIAGNOSIS — R293 Abnormal posture: Secondary | ICD-10-CM

## 2022-07-24 DIAGNOSIS — C50412 Malignant neoplasm of upper-outer quadrant of left female breast: Secondary | ICD-10-CM

## 2022-07-24 DIAGNOSIS — Z170 Estrogen receptor positive status [ER+]: Secondary | ICD-10-CM | POA: Diagnosis not present

## 2022-07-24 NOTE — Therapy (Signed)
OUTPATIENT PHYSICAL THERAPY BREAST CANCER POST OP FOLLOW UP   Patient Name: Caitlin [name] MRN: [id number] DOB:11/11/1971, 51 y.o., female Today's Date: 07/24/2022   PT End of Session - 07/24/22 0944     Visit Number 11    Number of Visits 11    Date for PT Re-Evaluation 07/24/22    PT Start Time 0900    PT Stop Time 0940    PT Time Calculation (min) 40 min    Activity Tolerance Patient tolerated treatment well    Behavior During Therapy WFL for tasks assessed/performed               Past Medical History:  Diagnosis Date   Anxiety    Back pain    Edema of both lower extremities    Elevated cholesterol    Endometriosis    Endometriosis    Family history of kidney cancer    Family history of ovarian cancer    Gallbladder problem    High cholesterol    History of IBS    IBS (irritable bowel syndrome)    Infertility, female    Vitamin D deficiency    Past Surgical History:  Procedure Laterality Date   BREAST LUMPECTOMY WITH RADIOACTIVE SEED AND SENTINEL LYMPH NODE BIOPSY Left 04/22/2022   Procedure: LEFT BREAST LUMPECTOMY WITH RADIOACTIVE SEED;  Surgeon: Blackman, Douglas, MD;  Location: [facility] OR;  Service: General;  Laterality: Left;  LMA   CHOLECYSTECTOMY  2018   LAPAROSCOPY     SENTINEL NODE BIOPSY Left 04/22/2022   Procedure: LEFT SENTINEL LYMPH NODE BIOPSY;  Surgeon: Blackman, Douglas, MD;  Location: [facility] OR;  Service: General;  Laterality: Left;   Patient Active Problem List   Diagnosis Date Noted   Genetic testing 04/29/2022   Family history of ovarian cancer 04/15/2022   Family history of kidney cancer 04/15/2022   Malignant neoplasm of upper-outer quadrant of left breast in female, estrogen receptor positive (HCC) 04/02/2022   Vitamin D insufficiency 02/18/2021   Elevated cholesterol 02/18/2021   Class 1 obesity due to excess calories with body mass index (BMI) of 34.0 to 34.9 in adult 02/07/2021   Irritable bowel syndrome 02/07/2021    PCP: Richter,  Karen, MD  REFERRING PROVIDER: Blackman, Douglas, MD  REFERRING DIAG: Lt breast cancer  THERAPY DIAG:  Aftercare following surgery for neoplasm  Abnormal posture  Malignant neoplasm of upper-outer quadrant of left breast in female, estrogen receptor positive (HCC)  Lymphedema, not elsewhere classified  ONSET DATE: 03/29/22  SUBJECTIVE:                                                                                                                                                                                       SUBJECTIVE STATEMENT: Radiation is now done.  It was really bad last time when I saw Valerie but it is not as bad as all this week.  I have been able to get my compression bra back on.    PERTINENT HISTORY:  Patient was diagnosed with left IDC. ER/PR positive, HER2 negative KI67 less than 5%. Pt had left lumpectomy and SLNB on 04/22/22 with Dr. Blackman. Radiation. No chemotherapy.  Radiation completed 07/09/22. Starting tamoxifen.   PATIENT GOALS:  Reassess how my recovery is going related to arm function, pain, and swelling.  PAIN:  Are you having pain? No There is a hard lump under the top of the incision that is tender  PRECAUTIONS: Recent Surgery, left UE Lymphedema risk,   ACTIVITY LEVEL / LEISURE: I am back to normal activity    OBJECTIVE:   PATIENT SURVEYS:  QUICK DASH: 27%  OBSERVATIONS: Left breast is larger than the right, nipple incision well healed, axillary incision is very red with a rolled inferior edge and some yellow sloughy tissue above this.  PA is okay with healing status.  - incision does not pull open with PROM  PALPATION: firmness possible seroma vs scar tissue medial/inferior to incision not as palpable in the supine position  LYMPHEDEMA ASSESSMENT:   UPPER EXTREMITY AROM/PROM:                          (Blank rows = not tested)   A/PROM LEFT  04/03/2022 05/13/22  Shoulder extension 68   Shoulder flexion 160 163  Shoulder abduction 165  165  Shoulder internal rotation     Shoulder external rotation 100 95                          (Blank rows = not tested)   LYMPHEDEMA ASSESSMENTS:    LANDMARK RIGHT  04/03/2022  10 cm proximal to olecranon process    Olecranon process    10 cm proximal to ulnar styloid process    Just proximal to ulnar styloid process    Across hand at thumb web space    At base of 2nd digit    (Blank rows = not tested)   LANDMARK LEFT  04/03/2022 05/13/22  10 cm proximal to olecranon process 37 37  Olecranon process 29.5 30  10 cm proximal to ulnar styloid process 24 25  Just proximal to ulnar styloid process 17.1 17.3  Across hand at thumb web space 19.6 19.4  At base of 2nd digit 6.5 6.6  (Blank rows = not tested)    PATIENT EDUCATION:  Education details: POC, briefly self MLD for the Left breast omitting inguinal steps for today Person educated: Patient Education method: Explanation, Demonstration, Tactile cues, Verbal cues, and Handouts Education comprehension: verbalized understanding, returned demonstration, verbal cues required, tactile cues required, and needs further education  HOME EXERCISE PROGRAM:  Reviewed previously given post op HEP.  Self MLD Lt breast without inguinal steps for now   TODAY'S TREATMENT  Pt permission and consent throughout each step of examination and treatment with modification and draping if requested when working on sensitive areas 07/24/22 Pt returns after radiation with continued edema of the breast.   The left breast is still red with some mild wet desquamation under the breast Breast appears larger than the Right but has always been larger than the other side Breast edema questionnaire:  51/80 MLD: With pt permission, In Supine:   short neck, superficial and deep abdominals, bil axillary nodes and Lt inguinal nodes, anterior inter-axillary and Lt axillo-inguinal anastomosis, then focused on Lt breast avoiding inferior blistered breast area, then into Rt S/L  for further work to lateral breast, then finished retracing steps in supine    06/11/22 Manual Therapy: MLD: With pt permission, In Supine: short neck, superficial and deep abdominals, bil axillary nodes and Lt inguinal nodes, anterior inter-axillary and Lt axillo-inguinal anastomosis, then focused on Lt breast, then into Rt S/L for further work to lateral breast and fibrotic techniques over area of seroma, then finished retracing steps in supine   06/09/22 Manual Therapy: MLD: With pt permission, In Supine: short neck, superficial and deep abdominals, bil axillary nodes and Lt inguinal nodes, anterior inter-axillary and Lt axillo-inguinal anastomosis, then focused on Lt breast, then into Rt S/L for further work to lateral breast and fibrotic techniques over area of seroma, then finished retracing steps in supine    ASSESSMENT: CLINICAL IMPRESSION: Pt is now post radiation reporting that when she saw Dr. Isidore Moos last week it was much worse but now is getting much better.  The breast is now red with some desquamation under the breast but pt reports not painful or tender to touch.  Reviewed/performed MLD today with instruction for pt to keep with compression and self MLD and that it should continue to improve over time as she is only 2 weeks post radiation.  Decided that she will keep up with self care and return as needed.   Pt will benefit from skilled therapeutic intervention to improve on the following deficits: Decreased knowledge of precautions, impaired UE functional use, pain, decreased ROM, postural dysfunction.   PT treatment/interventions: ADL/Self care home management, Therapeutic exercises, Patient/Family education, Manual lymph drainage, Compression bandaging, Taping, Manual therapy, and Re-evaluation  GOALS: Goals reviewed with patient? Yes  LONG TERM GOALS:  (STG=LTG)  GOALS Name Target Date (Remove Blue Hyperlink) Goal status  1 Pt will demonstrate she has regained full  shoulder ROM and function post operatively compared to baselines.  Baseline: 05/13/22 MET  2 Pt will be ind with self MLD for the left breast 07/02/22 MET  3 Pt will decrease heaviness of the breast by at least 50% 07/02/22 MET  4 Pt will decrease breast edema questionnaire to at least 25/80 09/18/22 NEW     PLAN: PT FREQUENCY/DURATION: PRN for up to 8 weeks.   PLAN FOR NEXT SESSION: Lt breast MLD and self MLD review     Shan Levans, PT  07/24/22 9:47 AM       Manual Lymph Drainage for Left Breast.   Do daily.  Do slowly. Use flat hands with just enough pressure to stretch the skin. Do not slide over the skin   (Stretch  Relax  Move) Lie down or sit comfortably (in a recliner, for example) to do this.   Do circles at each collar bone near neck 5-7 times (to "wake up" lots of lymph nodes in this area).  Take slow deep breaths, allowing your belly to balloon out as you breathe in, 5x (to "wake up" abdominal lymph nodes).  Both armpits--stretch skin in small circles to stimulate intact lymph nodes there, 5-7x.  Redirect fluid from left chest toward right armpit (stretch skin starting at left chest in 3-4 spots working toward right armpit) 3-4x across the chest.  Draw an imaginary diagonal line from upper outer breast through the nipple area toward lower inner breast.  Direct fluid upward and  inward from this line toward the pathway across your upper chest (established in #5).  Do this in three rows to treat all the upper inner breast and do each row 3-4x.  Then direct the fluid down and out from this line toward the pathway down your side going towards the left groin. Do this in three rows and do each row 3-4x. (established in #6)   Then repeat your pathways (#5 and #6)  End with repeating #3 and #4 above. (circles in both armpits and the left groin) PHYSICAL THERAPY DISCHARGE SUMMARY  Visits from Start of Care: 10  Current functional level related to goals / functional  outcomes: See above   Remaining deficits: Breast edema, lymphedema risk   Education / Equipment: Final HEP  Plan: Patient agrees to discharge. Patient is being discharged due to meeting the stated rehab goals.       

## 2022-07-29 DIAGNOSIS — N809 Endometriosis, unspecified: Secondary | ICD-10-CM | POA: Diagnosis not present

## 2022-07-29 DIAGNOSIS — C50919 Malignant neoplasm of unspecified site of unspecified female breast: Secondary | ICD-10-CM | POA: Diagnosis not present

## 2022-07-31 ENCOUNTER — Telehealth: Payer: Self-pay | Admitting: *Deleted

## 2022-07-31 NOTE — Telephone Encounter (Signed)
Pt called regarding discussion with gyn and request to switch to AI from Tamoxifen. Msg sent to Dr. [name] regarding need for appt to further discuss.  Pt request counseling at this time for cancer dx. Msg sent to [facility]   No further needs voiced at this time.

## 2022-08-06 ENCOUNTER — Encounter (INDEPENDENT_AMBULATORY_CARE_PROVIDER_SITE_OTHER): Payer: Self-pay

## 2022-08-07 ENCOUNTER — Inpatient Hospital Stay: Payer: BC Managed Care – PPO | Attending: Hematology and Oncology | Admitting: Licensed Clinical Social Worker

## 2022-08-07 ENCOUNTER — Other Ambulatory Visit: Payer: Self-pay

## 2022-08-07 DIAGNOSIS — Z170 Estrogen receptor positive status [ER+]: Secondary | ICD-10-CM | POA: Insufficient documentation

## 2022-08-07 DIAGNOSIS — Z923 Personal history of irradiation: Secondary | ICD-10-CM | POA: Insufficient documentation

## 2022-08-07 DIAGNOSIS — C50412 Malignant neoplasm of upper-outer quadrant of left female breast: Secondary | ICD-10-CM | POA: Insufficient documentation

## 2022-08-07 DIAGNOSIS — Z5111 Encounter for antineoplastic chemotherapy: Secondary | ICD-10-CM | POA: Insufficient documentation

## 2022-08-07 NOTE — Progress Notes (Signed)
[facility] CSW Counseling Note  Patient was referred by self. Treatment type: Individual  Presenting Concerns: Patient and/or family reports the following symptoms/concerns: stress and post-cancer adjustment Duration of problem:  weeks; Severity of problem: moderate   Orientation:oriented to person, place, time/date, and situation.   Affect: Appropriate, Congruent, and Tearful   Patient and/or Family's Strengths/Protective Factors: Concrete supports in place (healthy food, safe environments, etc.)Ability for insight  Capable of independent living  Communication skills  Work skills      Goals Addressed: Patient will:  Reduce symptoms of: stress Increase knowledge and/or ability of: coping skills and stress reduction  Increase healthy adjustment to current life circumstances and Increase healthy communication with family   Progress towards Goals: Initial   Interventions: Interventions utilized:  Solution Focused and Supportive      Assessment: Patient currently experiencing increased stress and frustration post-treatment that is impacting relationships. Pt now has time to process and missing that she was not able to spend time memory making with oldest before he leaves for college this year. Experiencing increased frustration with family not helping as much as she hoped. Pt is acting as though everything is fine until it comes to a boiling point. CSW validated feelings and used solution focused techniques to assist pt in identifying strategies to manage stress and frustration to allow more space for processing.   Patient may benefit from ongoing counseling.   Plan: Follow up with CSW: in 1 weeks Behavioral recommendations: Continue to walk and listen to podcast in the morning. Create to-do list prioritized by now, next 1-2 weeks, next 1-2 months and utilize this to ask for help from family, including with preparing meals. During afternoon break time at work, write on your calendar what  you will do (lists, breathing exercise, craft, etc) Referral(s): given information on [facility], [facility] and [place] Clarice Bonaventure, LCSW

## 2022-08-14 ENCOUNTER — Other Ambulatory Visit: Payer: Self-pay

## 2022-08-14 ENCOUNTER — Inpatient Hospital Stay (HOSPITAL_BASED_OUTPATIENT_CLINIC_OR_DEPARTMENT_OTHER): Payer: BC Managed Care – PPO | Admitting: Hematology and Oncology

## 2022-08-14 ENCOUNTER — Encounter: Payer: Self-pay | Admitting: Hematology and Oncology

## 2022-08-14 ENCOUNTER — Telehealth: Payer: Self-pay

## 2022-08-14 DIAGNOSIS — C50412 Malignant neoplasm of upper-outer quadrant of left female breast: Secondary | ICD-10-CM

## 2022-08-14 DIAGNOSIS — Z5111 Encounter for antineoplastic chemotherapy: Secondary | ICD-10-CM | POA: Diagnosis not present

## 2022-08-14 DIAGNOSIS — Z923 Personal history of irradiation: Secondary | ICD-10-CM | POA: Diagnosis not present

## 2022-08-14 DIAGNOSIS — Z170 Estrogen receptor positive status [ER+]: Secondary | ICD-10-CM | POA: Diagnosis not present

## 2022-08-14 MED ORDER — LETROZOLE 2.5 MG PO TABS: 2.5000 mg | ORAL_TABLET | Freq: Every day | ORAL | 3 refills | Status: DC

## 2022-08-14 NOTE — Assessment & Plan Note (Signed)
This is a very pleasant 51 year old premenopausal female patient with newly diagnosed left breast invasive ductal carcinoma, ER/PR positive, HER2 negative referred to medical oncology for recommendations. She underwent lumpectomy followed by Oncotype DX testing.  Final pathology showed a 1.2 cm tumor, grade 2, margins uninvolved, 3 out of 3 sentinel lymph nodes negative.  Prognostics on prior biopsy showed ER 95% positive, PR 100% staining, HER2 negative and KI of 5% Oncotype DX showed a recurrence score of 16, distant recurrence risk at 9 years of 4% with antiestrogen therapy.  There appears to be no significant chemotherapy benefit in this group  She completed adjuvant radiation.  We have initially talked about tamoxifen for choice of antiestrogen but given some endometrial thickening issues, patient wanted to have this televisit to discuss about other options.  Today we have discussed about ovarian suppression with aromatase inhibitors since she is premenopausal.  I have discussed about adverse effects of aromatase inhibitors including but not limited to fatigue, postmenopausal symptoms, arthralgias, vaginal dryness, bone loss and questionable increased risk of cardiovascular events.   I also discussed about adverse effects of Zoladex which is mostly postmenopausal symptoms.  She is agreeable to this change.  Zoladex ordered and letrozole sent to the pharmacy of her choice.  Scheduling message sent Baseline bone density ordered.  She will return to clinic as scheduled

## 2022-08-14 NOTE — Telephone Encounter (Signed)
I called the patient today about her upcoming follow-up appointment in radiation oncology.   Given the state of the COVID-19 pandemic, concerning case numbers in our community, and guidance from [facility], I offered a phone assessment with the patient to determine if coming to the clinic was necessary. She accepted.  The patient denies any symptomatic concerns.  She reports good energy levels. She does report lingering swelling and a firm knot near her lumpectomy incision which can be tender. I offered to re-refer her to PT for additional treatment sessions and she accepted. Specifically, she reports good healing of her skin in the radiation fields.  Skin is intact and almost returned to her baseline color/texture. I recommended that she continue skin care by applying oil or lotion with vitamin E to the skin in the radiation fields, BID, for 2 more months.    Continue follow-up with medical oncology. She had a telephone encounter earlier today with Dr. [name] to discuss anti-estrogen therapy. She will see Sharyl Nimrod in the [place] clinic in October. I explained that yearly mammograms are important for patients with intact breast tissue, and physical exams are important after mastectomy for patients that cannot undergo mammography.  I encouraged her to call if she had further questions or concerns about her healing. Otherwise, she will follow-up PRN in radiation oncology. Patient is pleased with this plan, and we will cancel her upcoming follow-up to reduce the risk of COVID-19 transmission.

## 2022-08-14 NOTE — Progress Notes (Signed)
[facility] NOTE  Patient Care Team: [name], MD as PCP - General (Family Medicine) Mauro Kaufmann, RN as Oncology Nurse Navigator Rockwell Germany, RN as Oncology Nurse Navigator  CHIEF COMPLAINTS/PURPOSE OF CONSULTATION:  Newly diagnosed breast cancer  HISTORY OF PRESENTING ILLNESS:  Caitlin [name] 51 y.o. female is here because of recent diagnosis of left breast cancer.  I reviewed her records extensively and collaborated the history with the patient.  SUMMARY OF ONCOLOGIC HISTORY: Oncology History  Malignant neoplasm of upper-outer quadrant of left breast in female, estrogen receptor positive ([place])  03/10/2022 Mammogram   Mammogram of the left breast showed irregular hypoechoic mass in the left breast.  Targeted ultrasound performed also confirmed an irregular hypoechoic mass in the left breast at 2:00 2 cm from the nipple measuring 1.1 x 0.8 x 0.5 cm.  Left axilla did not show any enlarged lymphadenopathy.   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  Prognostic showed ER 95% positive moderate staining.  PR 100% positive strong staining.  HER2 equivocal by IHC.  FISH negative    04/02/2022 Initial Diagnosis   Malignant neoplasm of upper-outer quadrant of left breast in female, estrogen receptor positive ([place])   04/22/2022 Surgery   She had left breast lumpectomy on April 25 and pathology showed invasive ductal carcinoma Nottingham grade 2 out of 3, 1.0 cm, DCIS, intermediate grade, margins uninvolved by carcinoma at 3 out of 3 sentinel lymph nodes without any evidence of micrometastasis or micrometastasis.  Prognostics on prior biopsy showed ER +95% ER +100% staining HER2 negative, KI of less than 5%   04/25/2022 Genetic Testing   Negative genetic testing on the CancerNext-Expanded+RNainsight panel.  PALB2 c.94C>G  VUS identified. The report date is April 25, 2022.  The CancerNext-Expanded gene panel offered by [facility] and includes sequencing and rearrangement analysis for the following 77 genes: AIP, ALK, APC*, ATM*, AXIN2, BAP1, BARD1, BLM, BMPR1A, BRCA1*, BRCA2*, BRIP1*, CDC73, CDH1*, CDK4, CDKN1B, CDKN2A, CHEK2*, CTNNA1, DICER1, FANCC, FH, FLCN, GALNT12, KIF1B, LZTR1, MAX, MEN1, MET, MLH1*, MSH2*, MSH3, MSH6*, MUTYH*, NBN, NF1*, NF2, NTHL1, PALB2*, PHOX2B, PMS2*, POT1, PRKAR1A, PTCH1, PTEN*, RAD51C*, RAD51D*, RB1, RECQL, RET, SDHA, SDHAF2, SDHB, SDHC, SDHD, SMAD4, SMARCA4, SMARCB1, SMARCE1, STK11, SUFU, TMEM127, TP53*, TSC1, TSC2, VHL and XRCC2 (sequencing and deletion/duplication); EGFR, EGLN1, HOXB13, KIT, MITF, PDGFRA, POLD1, and POLE (sequencing only); EPCAM and GREM1 (deletion/duplication only). DNA and RNA analyses performed for * genes.    05/07/2022 Oncotype testing   Oncotype recurrence score of 16, distant recurrence risk at 9 years is 4%, group average absolute chemotherapy benefit less than 1%   07/22/2022 Cancer Staging   Staging form: Breast, AJCC 8th Edition - Clinical: Stage IA (cT1c, cN0, cM0, G2, ER+, PR+, HER2-) - Signed by [name], MD on 07/22/2022 Histologic grading system: 3 grade system    Interval history  Caitlin [name] is here for a follow up via telehealth During her last visit we have discussed about tamoxifen however she mentions to me today that her gynecologist prefers aromatase inhibitors given her endometrial issues. She has healed well.  Rest of the pertinent 10 point ROS reviewed and negative  MEDICAL HISTORY:  Past Medical History:  Diagnosis Date   Anxiety    Back pain    Edema of both lower extremities    Elevated cholesterol    Endometriosis  Endometriosis    Family history of kidney cancer    Family history of ovarian cancer    Gallbladder problem    High cholesterol    History of IBS    IBS (irritable bowel syndrome)    Infertility,  female    Vitamin D deficiency     SURGICAL HISTORY: Past Surgical History:  Procedure Laterality Date   BREAST LUMPECTOMY WITH RADIOACTIVE SEED AND SENTINEL LYMPH NODE BIOPSY Left 04/22/2022   Procedure: LEFT BREAST LUMPECTOMY WITH RADIOACTIVE SEED;  Surgeon: Coralie Keens, MD;  Location: [facility];  Service: General;  Laterality: Left;  LMA   CHOLECYSTECTOMY  2018   LAPAROSCOPY     SENTINEL NODE BIOPSY Left 04/22/2022   Procedure: LEFT SENTINEL LYMPH NODE BIOPSY;  Surgeon: Coralie Keens, MD;  Location: [facility];  Service: General;  Laterality: Left;    SOCIAL HISTORY: Social History   Socioeconomic History   Marital status: Married    Spouse name: Gerald Stabs   Number of children: 3   Years of education: Not on file   Highest education level: Not on file  Occupational History   Occupation: English as a second language teacher  Tobacco Use   Smoking status: Never   Smokeless tobacco: Never  Vaping Use   Vaping Use: Never used  Substance and Sexual Activity   Alcohol use: No   Drug use: No   Sexual activity: Yes    Birth control/protection: None  Other Topics Concern   Not on file  Social History Narrative   Right Handed   Two Story Home   Drinks Caffeine Occasionally    Social Determinants of Health   Financial Resource Strain: Low Risk  (05/01/2022)   Overall Financial Resource Strain (CARDIA)    Difficulty of Paying Living Expenses: Not hard at all  Food Insecurity: No Food Insecurity (05/01/2022)   Hunger Vital Sign    Worried About Running Out of Food in the Last Year: Never true    [place] in the Last Year: Never true  Transportation Needs: No Transportation Needs (05/01/2022)   PRAPARE - Hydrologist (Medical): No    Lack of Transportation (Non-Medical): No  Physical Activity: Not on file  Stress: Not on file  Social Connections: Not on file  Intimate Partner Violence: Not on file    FAMILY HISTORY: Family History  Problem Relation Age of Onset    Thyroid disease Mother    Depression Mother    Anxiety disorder Mother    Obesity Mother    Hypertension Father    Heart disease Father    Heart disease Maternal Aunt    Diabetes Maternal Aunt    Kidney cancer Maternal Aunt 72   Rheum arthritis Paternal Aunt    Heart disease Maternal Grandmother    COPD Maternal Grandmother    Heart disease Maternal Grandfather    Ovarian cancer Paternal Grandmother        dx in her 2s   Cancer Cousin        unknown cancer in mat first cousin    ALLERGIES:  is allergic to ciprofloxacin and sulfa antibiotics.  MEDICATIONS:  Current Outpatient Medications  Medication Sig Dispense Refill   letrozole (FEMARA) 2.5 MG tablet Take 1 tablet (2.5 mg total) by mouth daily. 90 tablet 3   furosemide (LASIX) 20 MG tablet TAKE ONE TABLET BY MOUTH DAILY 30 tablet 6   VITAMIN D, CHOLECALCIFEROL, PO Take 1 tablet by mouth daily.  No current facility-administered medications for this visit.    REVIEW OF SYSTEMS:   Constitutional: Denies fevers, chills or abnormal night sweats Eyes: Denies blurriness of vision, double vision or watery eyes Ears, nose, mouth, throat, and face: Denies mucositis or sore throat Respiratory: Denies cough, dyspnea or wheezes Cardiovascular: Denies palpitation, chest discomfort or lower extremity swelling Gastrointestinal:  Denies nausea, heartburn or change in bowel habits Skin: Denies abnormal skin rashes Lymphatics: Denies new lymphadenopathy or easy bruising Neurological:Denies numbness, tingling or new weaknesses Behavioral/Psych: Mood is stable, no new changes  Breast: Denies any palpable lumps or discharge All other systems were reviewed with the patient and are negative.  PHYSICAL EXAMINATION: ECOG PERFORMANCE STATUS: 0 - Asymptomatic  There were no vitals filed for this visit.    There were no vitals filed for this visit.  Physical examination not done, telehealth with   LABORATORY DATA:  I have reviewed  the data as listed Lab Results  Component Value Date   WBC 8.7 04/15/2022   HGB 14.0 04/15/2022   HCT 42.2 04/15/2022   MCV 90.6 04/15/2022   PLT 282 04/15/2022   Lab Results  Component Value Date   NA 138 04/15/2022   K 3.6 04/15/2022   CL 104 04/15/2022   CO2 27 04/15/2022    RADIOGRAPHIC STUDIES: I have personally reviewed the radiological reports and agreed with the findings in the report.  I connected with  Jeanette Caprice on 08/14/22 by a video enabled telemedicine application and verified that I am speaking with the correct person using two identifiers.   I discussed the limitations of evaluation and management by telemedicine. The patient expressed understanding and agreed to proceed.   ASSESSMENT AND PLAN:  Malignant neoplasm of upper-outer quadrant of left breast in female, estrogen receptor positive ([place]) This is a very pleasant 51 year old premenopausal female patient with newly diagnosed left breast invasive ductal carcinoma, ER/PR positive, HER2 negative referred to medical oncology for recommendations. She underwent lumpectomy followed by Oncotype DX testing.  Final pathology showed a 1.2 cm tumor, grade 2, margins uninvolved, 3 out of 3 sentinel lymph nodes negative.  Prognostics on prior biopsy showed ER 95% positive, PR 100% staining, HER2 negative and KI of 5% Oncotype DX showed a recurrence score of 16, distant recurrence risk at 9 years of 4% with antiestrogen therapy.  There appears to be no significant chemotherapy benefit in this group  She completed adjuvant radiation.  We have initially talked about tamoxifen for choice of antiestrogen but given some endometrial thickening issues, patient wanted to have this televisit to discuss about other options.  Today we have discussed about ovarian suppression with aromatase inhibitors since she is premenopausal.  I have discussed about adverse effects of aromatase inhibitors including but not limited to fatigue,  postmenopausal symptoms, arthralgias, vaginal dryness, bone loss and questionable increased risk of cardiovascular events.   I also discussed about adverse effects of Zoladex which is mostly postmenopausal symptoms.  She is agreeable to this change.  Zoladex ordered and letrozole sent to the pharmacy of her choice.  Scheduling message sent Baseline bone density ordered.  She will return to clinic as scheduled   Total time spent: 30 minutes All questions were answered. The patient knows to call the clinic with any problems, questions or concerns.    [name], MD 08/14/22

## 2022-08-15 ENCOUNTER — Other Ambulatory Visit: Payer: Self-pay

## 2022-08-15 ENCOUNTER — Inpatient Hospital Stay: Payer: BC Managed Care – PPO | Admitting: Licensed Clinical Social Worker

## 2022-08-15 ENCOUNTER — Ambulatory Visit: Payer: BC Managed Care – PPO | Admitting: Radiation Oncology

## 2022-08-15 NOTE — Progress Notes (Signed)
[facility] CSW Counseling Note  Patient was referred by self. Treatment type: Individual  Presenting Concerns: Patient and/or family reports the following symptoms/concerns: stress and post-cancer adjustment Duration of problem:  weeks; Severity of problem: moderate   Orientation:oriented to person, place, time/date, and situation.   Affect: Appropriate, Congruent, and Tearful   Patient and/or Family's Strengths/Protective Factors: Concrete supports in place (healthy food, safe environments, etc.)Ability for insight  Capable of independent living  Communication skills  Work skills      Goals Addressed: Patient will:  Reduce symptoms of: stress Increase knowledge and/or ability of: coping skills and stress reduction  Increase healthy adjustment to current life circumstances and Increase healthy communication with family   Progress towards Goals: Progressing   Interventions: Interventions utilized:  Solution Focused and Supportive      Assessment: Patient currently experiencing improvement in mood and frustration since last visit. She had a busy but helpful weekend at travel tournament with her kids and found strength in herself through that. Continuing to work towards goals from last visit with to-do lists for distributing chores and has taken steps towards that (meal prep, husband helping with dinners). Pt also began more making future plans by adopting two kittens who have been calming for her as well. 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   Patient may benefit from continued skill building and boundary setting.   Plan: Follow up with CSW: in 3 weeks Behavioral recommendations: continue strategies for making space for yourself (walks, to-do lists for chore sharing, enjoyable activity during break times). Use journal prompts to continue recognizing gratitude and strengths  Referral(s): given information on [employer],  [employer] and [employer] [facility] Guillermina Shaft, LCSW

## 2022-08-18 ENCOUNTER — Encounter: Payer: Self-pay | Admitting: Rehabilitation

## 2022-08-18 ENCOUNTER — Ambulatory Visit: Payer: BC Managed Care – PPO | Attending: Radiation Oncology | Admitting: Rehabilitation

## 2022-08-18 ENCOUNTER — Encounter: Payer: Self-pay | Admitting: Hematology and Oncology

## 2022-08-18 DIAGNOSIS — R293 Abnormal posture: Secondary | ICD-10-CM | POA: Diagnosis not present

## 2022-08-18 DIAGNOSIS — Z170 Estrogen receptor positive status [ER+]: Secondary | ICD-10-CM | POA: Diagnosis not present

## 2022-08-18 DIAGNOSIS — C50412 Malignant neoplasm of upper-outer quadrant of left female breast: Secondary | ICD-10-CM | POA: Diagnosis not present

## 2022-08-18 DIAGNOSIS — Z483 Aftercare following surgery for neoplasm: Secondary | ICD-10-CM | POA: Insufficient documentation

## 2022-08-18 DIAGNOSIS — I890 Lymphedema, not elsewhere classified: Secondary | ICD-10-CM | POA: Diagnosis not present

## 2022-08-18 NOTE — Progress Notes (Signed)
                                                                                                                                                             Patient Name: Caitlin [name] MRN: [id number] DOB: 03-12-1971 Referring Physician: Horald Pollen (Profile Not Attached) Date of Service: 07/09/2022 [facility], [place]                                                        End Of Treatment Note  Diagnoses: C50.412-Malignant neoplasm of upper-outer quadrant of left female breast  Cancer Staging:  Cancer Staging  Malignant neoplasm of upper-outer quadrant of left breast in female, estrogen receptor positive ([place]) Staging form: Breast, AJCC 8th Edition - Clinical: Stage IA (cT1c, cN0, cM0, G2, ER+, PR+, HER2-) - Signed by [name], MD on 07/22/2022 Histologic grading system: 3 grade system  Intent: Curative  Radiation Treatment Dates: 06/11/2022 through 07/09/2022 Site Technique Total Dose (Gy) Dose per Fx (Gy) Completed Fx Beam Energies  Breast, Left: Breast_L 3D 40.05/40.05 2.67 15/15 10X  Breast, Left: Breast_L_Bst 3D 10/10 2 5/5 6X   Narrative: The patient tolerated radiation therapy relatively well.   Plan: The patient will follow-up with radiation oncology in 18mo . -----------------------------------  Eppie Gibson, MD

## 2022-08-18 NOTE — Therapy (Signed)
OUTPATIENT PHYSICAL THERAPY BREAST CANCER POST OP FOLLOW UP   Patient Name: Caitlin [name] MRN: [id number] DOB:1971/05/20, 51 y.o., female Today's Date: 08/18/2022   PT End of Session - 08/18/22 1256     Visit Number 12    Number of Visits 20    Date for PT Re-Evaluation 09/29/22    PT Start Time 1300    PT Stop Time 9249    PT Time Calculation (min) 47 min    Activity Tolerance Patient tolerated treatment well    Behavior During Therapy WFL for tasks assessed/performed               Past Medical History:  Diagnosis Date   Anxiety    Back pain    Edema of both lower extremities    Elevated cholesterol    Endometriosis    Endometriosis    Family history of kidney cancer    Family history of ovarian cancer    Gallbladder problem    High cholesterol    History of IBS    IBS (irritable bowel syndrome)    Infertility, female    Vitamin D deficiency    Past Surgical History:  Procedure Laterality Date   BREAST LUMPECTOMY WITH RADIOACTIVE SEED AND SENTINEL LYMPH NODE BIOPSY Left 04/22/2022   Procedure: LEFT BREAST LUMPECTOMY WITH RADIOACTIVE SEED;  Surgeon: Coralie Keens, MD;  Location: [facility];  Service: General;  Laterality: Left;  LMA   CHOLECYSTECTOMY  2018   LAPAROSCOPY     SENTINEL NODE BIOPSY Left 04/22/2022   Procedure: LEFT SENTINEL LYMPH NODE BIOPSY;  Surgeon: Coralie Keens, MD;  Location: [facility];  Service: General;  Laterality: Left;   Patient Active Problem List   Diagnosis Date Noted   Genetic testing 04/29/2022   Family history of ovarian cancer 04/15/2022   Family history of kidney cancer 04/15/2022   Malignant neoplasm of upper-outer quadrant of left breast in female, estrogen receptor positive ([place]) 04/02/2022   Vitamin D insufficiency 02/18/2021   Elevated cholesterol 02/18/2021   Class 1 obesity due to excess calories with body mass index (BMI) of 34.0 to 34.9 in adult 02/07/2021   Irritable bowel syndrome 02/07/2021    PCP: Horald Pollen, MD  REFERRING PROVIDER: Coralie Keens, MD  REFERRING DIAG: Lt breast cancer  THERAPY DIAG:  Aftercare following surgery for neoplasm  Abnormal posture  Malignant neoplasm of upper-outer quadrant of left breast in female, estrogen receptor positive ([place])  Lymphedema, not elsewhere classified  ONSET DATE: 03/29/22  SUBJECTIVE:  SUBJECTIVE STATEMENT:   It is still swollen.  The skin feels fine now.  The arm may feel tight.  Still wearing compression bras.    PERTINENT HISTORY:  Patient was diagnosed with left IDC. ER/PR positive, HER2 negative KI67 less than 5%. Pt had left lumpectomy and SLNB on 04/22/22 with Dr. [name]. Radiation. No chemotherapy.  Radiation completed 07/09/22. Starting tamoxifen.   PATIENT GOALS:  Reassess how my recovery is going related to arm function, pain, and swelling.  PAIN:  Are you having pain? Sometimes I have days where the seroma is tender and sometimes it is fine  PRECAUTIONS: Recent Surgery, left UE Lymphedema risk,   ACTIVITY LEVEL / LEISURE: I am back to normal activity    OBJECTIVE:  OBSERVATIONS: Left breast is larger than the right, nipple incision well healed, axillary incision is very red with a rolled inferior edge and some yellow sloughy tissue above this.  PA is okay with healing status.  - incision does not pull open with PROM  PALPATION: firmness possible seroma vs scar tissue medial/inferior to incision not as palpable in the supine position  LYMPHEDEMA ASSESSMENT:   UPPER EXTREMITY AROM/PROM:                          (Blank rows = not tested)   A/PROM LEFT  04/03/2022 05/13/22 08/18/22  Shoulder extension 68    Shoulder flexion 160 163 163  Shoulder abduction 165 165 165  Shoulder internal rotation      Shoulder external rotation 100 95 95 -  breast pull                          (Blank rows = not tested)   LYMPHEDEMA ASSESSMENTS:    LANDMARK RIGHT  04/03/2022  10 cm proximal to olecranon process    Olecranon process    10 cm proximal to ulnar styloid process    Just proximal to ulnar styloid process    Across hand at thumb web space    At base of 2nd digit    (Blank rows = not tested)   [facility] LEFT  04/03/2022 05/13/22  10 cm proximal to olecranon process 37 37  Olecranon process 29.'5 30  10 ' cm proximal to ulnar styloid process 24 25  Just proximal to ulnar styloid process 17.1 17.3  Across hand at thumb web space 19.6 19.4  At base of 2nd digit 6.5 6.6  (Blank rows = not tested)    PATIENT EDUCATION:  Education details: POC, briefly self MLD for the Left breast omitting inguinal steps for today Person educated: Patient Education method: Explanation, Demonstration, Tactile cues, Verbal cues, and Handouts Education comprehension: verbalized understanding, returned demonstration, verbal cues required, tactile cues required, and needs further education  HOME EXERCISE PROGRAM:  Reviewed previously given post op HEP.  Self MLD Lt breast without inguinal steps for now  TODAY'S TREATMENT  Pt permission and consent throughout each step of examination and treatment with modification and draping if requested when working on sensitive areas  08/18/22 Reassessed status - pt continues with enlarged pores medially, slight redness overall post radiation, some warmth but not infected.   Rechecked AROM which has slight more pull in overhead Y position MLD: In Supine: short neck, superficial and deep abdominals, bil axillary nodes and Lt inguinal nodes, anterior inter-axillary and Lt axillo-inguinal anastomosis, then focused on Lt breast, then into Rt S/L for further work  to lateral breast, then finished retracing steps in supine  Showed pt flexitouch and will send demo in today  07/24/22 Pt returns after radiation with continued  edema of the breast.   The left breast is still red with some mild wet desquamation under the breast Breast appears larger than the Right but has always been larger than the other side Breast edema questionnaire:  51/80 MLD: With pt permission, In Supine: short neck, superficial and deep abdominals, bil axillary nodes and Lt inguinal nodes, anterior inter-axillary and Lt axillo-inguinal anastomosis, then focused on Lt breast avoiding inferior blistered breast area, then into Rt S/L for further work to lateral breast, then finished retracing steps in supine   06/11/22 Manual Therapy: MLD: With pt permission, In Supine: short neck, superficial and deep abdominals, bil axillary nodes and Lt inguinal nodes, anterior inter-axillary and Lt axillo-inguinal anastomosis, then focused on Lt breast, then into Rt S/L for further work to lateral breast and fibrotic techniques over area of seroma, then finished retracing steps in supine   ASSESSMENT: CLINICAL IMPRESSION: Pt is now around 4 weeks post radiation with continued edema.  Will extend POC to continue working on edema.    Pt will benefit from skilled therapeutic intervention to improve on the following deficits: Decreased knowledge of precautions, impaired UE functional use, pain, decreased ROM, postural dysfunction.   PT treatment/interventions: ADL/Self care home management, Therapeutic exercises, Patient/Family education, Manual lymph drainage, Compression bandaging, Taping, Manual therapy, and Re-evaluation  GOALS: Goals reviewed with patient? Yes  LONG TERM GOALS:  (STG=LTG)  GOALS Name Target Date (Remove Blue Hyperlink) Goal status  1 Pt will demonstrate she has regained full shoulder ROM and function post operatively compared to baselines.  Baseline: 05/13/22 MET  2 Pt will be ind with self MLD for the left breast 07/02/22 MET  3 Pt will decrease heaviness of the breast by at least 50% 09/29/22 MET  4 Pt will decrease breast edema  questionnaire to at least 25/80 09/29/22 NEW     PLAN: PT FREQUENCY/DURATION: 1-2x per week up to 6 weeks  PLAN FOR NEXT SESSION: Lt breast MLD and self MLD review, review flexi benefits as able, taping?     Shan Levans, PT  08/18/22 1:50 PM       Manual Lymph Drainage for Left Breast.   Do daily.  Do slowly. Use flat hands with just enough pressure to stretch the skin. Do not slide over the skin   (Stretch  Relax  Move) Lie down or sit comfortably (in a recliner, for example) to do this.   Do circles at each collar bone near neck 5-7 times (to "wake up" lots of lymph nodes in this area).  Take slow deep breaths, allowing your belly to balloon out as you breathe in, 5x (to "wake up" abdominal lymph nodes).  Both armpits--stretch skin in small circles to stimulate intact lymph nodes there, 5-7x.  Redirect fluid from left chest toward right armpit (stretch skin starting at left chest in 3-4 spots working toward right armpit) 3-4x across the chest.  Draw an imaginary diagonal line from upper outer breast through the nipple area toward lower inner breast.  Direct fluid upward and inward from this line toward the pathway across your upper chest (established in #5).  Do this in three rows to treat all the upper inner breast and do each row 3-4x.  Then direct the fluid down and out from this line toward the pathway down your side going towards  the left groin. Do this in three rows and do each row 3-4x. (established in #6)   Then repeat your pathways (#5 and #6)  End with repeating #3 and #4 above. (circles in both armpits and the left groin) PHYSICAL THERAPY DISCHARGE SUMMARY  Visits from Start of Care: 10  Current functional level related to goals / functional outcomes: See above   Remaining deficits: Breast edema, lymphedema risk   Education / Equipment: Final HEP  Plan: Patient agrees to discharge. Patient is being discharged due to meeting the stated rehab goals.

## 2022-08-25 ENCOUNTER — Encounter: Payer: Self-pay | Admitting: Rehabilitation

## 2022-08-25 ENCOUNTER — Inpatient Hospital Stay: Payer: BC Managed Care – PPO

## 2022-08-25 ENCOUNTER — Other Ambulatory Visit: Payer: Self-pay

## 2022-08-25 ENCOUNTER — Ambulatory Visit: Payer: BC Managed Care – PPO | Admitting: Rehabilitation

## 2022-08-25 VITALS — BP 144/84 | HR 83 | Temp 98.0°F

## 2022-08-25 DIAGNOSIS — Z483 Aftercare following surgery for neoplasm: Secondary | ICD-10-CM

## 2022-08-25 DIAGNOSIS — C50412 Malignant neoplasm of upper-outer quadrant of left female breast: Secondary | ICD-10-CM

## 2022-08-25 DIAGNOSIS — R293 Abnormal posture: Secondary | ICD-10-CM

## 2022-08-25 DIAGNOSIS — Z170 Estrogen receptor positive status [ER+]: Secondary | ICD-10-CM | POA: Diagnosis not present

## 2022-08-25 DIAGNOSIS — Z5111 Encounter for antineoplastic chemotherapy: Secondary | ICD-10-CM | POA: Diagnosis not present

## 2022-08-25 DIAGNOSIS — Z923 Personal history of irradiation: Secondary | ICD-10-CM | POA: Diagnosis not present

## 2022-08-25 DIAGNOSIS — I890 Lymphedema, not elsewhere classified: Secondary | ICD-10-CM

## 2022-08-25 MED ORDER — GOSERELIN ACETATE 3.6 MG ~~LOC~~ IMPL
3.6000 mg | DRUG_IMPLANT | Freq: Once | SUBCUTANEOUS | Status: AC
  Administered 2022-08-25: 3.6 mg via SUBCUTANEOUS
  Filled 2022-08-25: qty 3.6

## 2022-08-25 NOTE — Therapy (Signed)
OUTPATIENT PHYSICAL THERAPY TREATMENT   Patient Name: Caitlin [name] MRN: [id number] DOB:October 25, 1971, 51 y.o., female Today's Date: 08/25/2022   PT End of Session - 08/25/22 0858     Visit Number 13    Number of Visits 20    Date for PT Re-Evaluation 09/29/22    PT Start Time 0900    PT Stop Time 0948    PT Time Calculation (min) 48 min    Activity Tolerance Patient tolerated treatment well    Behavior During Therapy WFL for tasks assessed/performed               Past Medical History:  Diagnosis Date   Anxiety    Back pain    Edema of both lower extremities    Elevated cholesterol    Endometriosis    Endometriosis    Family history of kidney cancer    Family history of ovarian cancer    Gallbladder problem    High cholesterol    History of IBS    IBS (irritable bowel syndrome)    Infertility, female    Vitamin D deficiency    Past Surgical History:  Procedure Laterality Date   BREAST LUMPECTOMY WITH RADIOACTIVE SEED AND SENTINEL LYMPH NODE BIOPSY Left 04/22/2022   Procedure: LEFT BREAST LUMPECTOMY WITH RADIOACTIVE SEED;  Surgeon: Coralie Keens, MD;  Location: [place];  Service: General;  Laterality: Left;  LMA   CHOLECYSTECTOMY  2018   LAPAROSCOPY     SENTINEL NODE BIOPSY Left 04/22/2022   Procedure: LEFT SENTINEL LYMPH NODE BIOPSY;  Surgeon: Coralie Keens, MD;  Location: [place];  Service: General;  Laterality: Left;   Patient Active Problem List   Diagnosis Date Noted   Genetic testing 04/29/2022   Family history of ovarian cancer 04/15/2022   Family history of kidney cancer 04/15/2022   Malignant neoplasm of upper-outer quadrant of left breast in female, estrogen receptor positive ([place]) 04/02/2022   Vitamin D insufficiency 02/18/2021   Elevated cholesterol 02/18/2021   Class 1 obesity due to excess calories with body mass index (BMI) of 34.0 to 34.9 in adult 02/07/2021   Irritable bowel syndrome 02/07/2021    PCP: Horald Pollen, MD  REFERRING  PROVIDER: Coralie Keens, MD  REFERRING DIAG: Lt breast cancer  THERAPY DIAG:  Aftercare following surgery for neoplasm  Abnormal posture  Malignant neoplasm of upper-outer quadrant of left breast in female, estrogen receptor positive ([place])  Lymphedema, not elsewhere classified  ONSET DATE: 03/29/22  SUBJECTIVE:  SUBJECTIVE STATEMENT:   I am a little stiff I did a lot of travelling.    PERTINENT HISTORY:  Patient was diagnosed with left IDC. ER/PR positive, HER2 negative KI67 less than 5%. Pt had left lumpectomy and SLNB on 04/22/22 with Dr. [name]. Radiation. No chemotherapy.  Radiation completed 07/09/22. Starting tamoxifen.   PATIENT GOALS:  Reassess how my recovery is going related to arm function, pain, and swelling.  PAIN:  Are you having pain? Sometimes I have days where the seroma is tender and sometimes it is fine  PRECAUTIONS: Recent Surgery, left UE Lymphedema risk,   ACTIVITY LEVEL / LEISURE: I am back to normal activity    OBJECTIVE:  OBSERVATIONS: Left breast is larger than the right, nipple incision well healed, axillary incision is very red with a rolled inferior edge and some yellow sloughy tissue above this.  PA is okay with healing status.  - incision does not pull open with PROM  PALPATION: firmness possible seroma vs scar tissue medial/inferior to incision not as palpable in the supine position  LYMPHEDEMA ASSESSMENT:   UPPER EXTREMITY AROM/PROM:                          (Blank rows = not tested)   A/PROM LEFT  04/03/2022 05/13/22 08/18/22  Shoulder extension 68    Shoulder flexion 160 163 163  Shoulder abduction 165 165 165  Shoulder internal rotation      Shoulder external rotation 100 95 95 - breast pull                          (Blank rows = not tested)   LYMPHEDEMA  ASSESSMENTS:    LANDMARK RIGHT  04/03/2022  10 cm proximal to olecranon process    Olecranon process    10 cm proximal to ulnar styloid process    Just proximal to ulnar styloid process    Across hand at thumb web space    At base of 2nd digit    (Blank rows = not tested)   [facility] LEFT  04/03/2022 05/13/22  10 cm proximal to olecranon process 37 37  Olecranon process 29._0 cm proximal to ulnar styloid process 24 25  Just proximal to ulnar styloid process 17.1 17.3  Across hand at thumb web space 19.6 19.4  At base of 2nd digit 6.5 6.6  (Blank rows = not tested)    PATIENT EDUCATION:  Education details: POC, briefly self MLD for the Left breast omitting inguinal steps for today Person educated: Patient Education method: Explanation, Demonstration, Tactile cues, Verbal cues, and Handouts Education comprehension: verbalized understanding, returned demonstration, verbal cues required, tactile cues required, and needs further education  HOME EXERCISE PROGRAM:  Reviewed previously given post op HEP.  Self MLD Lt breast without inguinal steps for now  TODAY'S TREATMENT  Pt permission and consent throughout each step of examination and treatment with modification and draping if requested when working on sensitive areas 08/25/22 Pulleys into flexion and abduction x 2 min each MLD: In Supine: short neck, superficial and deep abdominals, bil axillary nodes and Lt inguinal nodes, anterior inter-axillary and Lt axillo-inguinal anastomosis, then focused on Lt breast, then into Rt S/L for further work to lateral breast, then finished retracing steps in supine  STM to axilla and pectoralis in overhead Y position and out to the side  08/18/22 Reassessed status - pt continues with enlarged pores  medially, slight redness overall post radiation, some warmth but not infected.   Rechecked AROM which has slight more pull in overhead Y position MLD: In Supine: short neck, superficial and deep  abdominals, bil axillary nodes and Lt inguinal nodes, anterior inter-axillary and Lt axillo-inguinal anastomosis, then focused on Lt breast, then into Rt S/L for further work to lateral breast, then finished retracing steps in supine  Showed pt flexitouch and will send demo in today  07/24/22 Pt returns after radiation with continued edema of the breast.   The left breast is still red with some mild wet desquamation under the breast Breast appears larger than the Right but has always been larger than the other side Breast edema questionnaire:  51/80 MLD: With pt permission, In Supine: short neck, superficial and deep abdominals, bil axillary nodes and Lt inguinal nodes, anterior inter-axillary and Lt axillo-inguinal anastomosis, then focused on Lt breast avoiding inferior blistered breast area, then into Rt S/L for further work to lateral breast, then finished retracing steps in supine   ASSESSMENT: CLINICAL IMPRESSION: Tolerating POC well.  Added STM today for pectoralis tightness.    Pt will benefit from skilled therapeutic intervention to improve on the following deficits: Decreased knowledge of precautions, impaired UE functional use, pain, decreased ROM, postural dysfunction.   PT treatment/interventions: ADL/Self care home management, Therapeutic exercises, Patient/Family education, Manual lymph drainage, Compression bandaging, Taping, Manual therapy, and Re-evaluation  GOALS: Goals reviewed with patient? Yes  LONG TERM GOALS:  (STG=LTG)  GOALS Name Target Date (Remove Blue Hyperlink) Goal status  1 Pt will demonstrate she has regained full shoulder ROM and function post operatively compared to baselines.  Baseline: 05/13/22 MET  2 Pt will be ind with self MLD for the left breast 07/02/22 MET  3 Pt will decrease heaviness of the breast by at least 50% 09/29/22 MET  4 Pt will decrease breast edema questionnaire to at least 25/80 09/29/22 NEW     PLAN: PT FREQUENCY/DURATION: 1-2x per  week up to 6 weeks  PLAN FOR NEXT SESSION: Lt breast MLD and self MLD review, review flexi benefits as able, taping?     Shan Levans, PT  08/25/22 9:48 AM       Manual Lymph Drainage for Left Breast.   Do daily.  Do slowly. Use flat hands with just enough pressure to stretch the skin. Do not slide over the skin   (Stretch  Relax  Move) Lie down or sit comfortably (in a recliner, for example) to do this.   Do circles at each collar bone near neck 5-7 times (to "wake up" lots of lymph nodes in this area).  Take slow deep breaths, allowing your belly to balloon out as you breathe in, 5x (to "wake up" abdominal lymph nodes).  Both armpits--stretch skin in small circles to stimulate intact lymph nodes there, 5-7x.  Redirect fluid from left chest toward right armpit (stretch skin starting at left chest in 3-4 spots working toward right armpit) 3-4x across the chest.  Draw an imaginary diagonal line from upper outer breast through the nipple area toward lower inner breast.  Direct fluid upward and inward from this line toward the pathway across your upper chest (established in #5).  Do this in three rows to treat all the upper inner breast and do each row 3-4x.  Then direct the fluid down and out from this line toward the pathway down your side going towards the left groin. Do this in three rows and do  each row 3-4x. (established in #6)   Then repeat your pathways (#5 and #6)  End with repeating #3 and #4 above. (circles in both armpits and the left groin) PHYSICAL THERAPY DISCHARGE SUMMARY  Visits from Start of Care: 10  Current functional level related to goals / functional outcomes: See above   Remaining deficits: Breast edema, lymphedema risk   Education / Equipment: Final HEP  Plan: Patient agrees to discharge. Patient is being discharged due to meeting the stated rehab goals.

## 2022-09-02 ENCOUNTER — Ambulatory Visit: Payer: BC Managed Care – PPO | Admitting: Rehabilitation

## 2022-09-05 ENCOUNTER — Inpatient Hospital Stay: Payer: BC Managed Care – PPO | Admitting: Licensed Clinical Social Worker

## 2022-09-08 ENCOUNTER — Ambulatory Visit: Payer: BC Managed Care – PPO | Attending: Radiation Oncology | Admitting: Rehabilitation

## 2022-09-08 DIAGNOSIS — C50412 Malignant neoplasm of upper-outer quadrant of left female breast: Secondary | ICD-10-CM | POA: Insufficient documentation

## 2022-09-08 DIAGNOSIS — I890 Lymphedema, not elsewhere classified: Secondary | ICD-10-CM | POA: Diagnosis not present

## 2022-09-08 DIAGNOSIS — Z483 Aftercare following surgery for neoplasm: Secondary | ICD-10-CM | POA: Diagnosis not present

## 2022-09-08 DIAGNOSIS — Z170 Estrogen receptor positive status [ER+]: Secondary | ICD-10-CM | POA: Diagnosis not present

## 2022-09-08 DIAGNOSIS — R293 Abnormal posture: Secondary | ICD-10-CM | POA: Diagnosis not present

## 2022-09-08 NOTE — Therapy (Signed)
OUTPATIENT PHYSICAL THERAPY TREATMENT   Patient Name: Caitlin [name] MRN: [id number] DOB:[birth year], 51 y.o., female Today's Date: 09/08/2022   PT End of Session - 09/08/22 0900     Visit Number 14    Number of Visits 20    Date for PT Re-Evaluation 09/29/22    PT Start Time 0903    PT Stop Time 0947    PT Time Calculation (min) 44 min    Activity Tolerance Patient tolerated treatment well    Behavior During Therapy [facility] for tasks assessed/performed                Past Medical History:  Diagnosis Date   Anxiety    Back pain    Edema of both lower extremities    Elevated cholesterol    Endometriosis    Endometriosis    Family history of kidney cancer    Family history of ovarian cancer    Gallbladder problem    High cholesterol    History of IBS    IBS (irritable bowel syndrome)    Infertility, female    Vitamin D deficiency    Past Surgical History:  Procedure Laterality Date   BREAST LUMPECTOMY WITH RADIOACTIVE SEED AND SENTINEL LYMPH NODE BIOPSY Left 04/22/2022   Procedure: LEFT BREAST LUMPECTOMY WITH RADIOACTIVE SEED;  Surgeon: Coralie Keens, MD;  Location: [place];  Service: General;  Laterality: Left;  LMA   CHOLECYSTECTOMY  2018   LAPAROSCOPY     SENTINEL NODE BIOPSY Left 04/22/2022   Procedure: LEFT SENTINEL LYMPH NODE BIOPSY;  Surgeon: Coralie Keens, MD;  Location: [place];  Service: General;  Laterality: Left;   Patient Active Problem List   Diagnosis Date Noted   Genetic testing 04/29/2022   Family history of ovarian cancer 04/15/2022   Family history of kidney cancer 04/15/2022   Malignant neoplasm of upper-outer quadrant of left breast in female, estrogen receptor positive ([place]) 04/02/2022   Vitamin D insufficiency 02/18/2021   Elevated cholesterol 02/18/2021   Class 1 obesity due to excess calories with body mass index (BMI) of 34.0 to 34.9 in adult 02/07/2021   Irritable bowel syndrome 02/07/2021    PCP: Horald Pollen,  MD  REFERRING PROVIDER: Coralie Keens, MD  REFERRING DIAG: Lt breast cancer  THERAPY DIAG:  Aftercare following surgery for neoplasm  Abnormal posture  Malignant neoplasm of upper-outer quadrant of left breast in female, estrogen receptor positive ([place])  Lymphedema, not elsewhere classified  ONSET DATE: 03/29/22  SUBJECTIVE:  SUBJECTIVE STATEMENT: I am getting the flexitouch trial set up  PERTINENT HISTORY:  Patient was diagnosed with left IDC. ER/PR positive, HER2 negative KI67 less than 5%. Pt had left lumpectomy and SLNB on 04/22/22 with Dr. [name]. Radiation. No chemotherapy.  Radiation completed 07/09/22. Starting tamoxifen.   PATIENT GOALS:  Reassess how my recovery is going related to arm function, pain, and swelling.  PAIN:  Are you having pain? Sometimes I have days where the seroma is tender and sometimes it is fine  PRECAUTIONS: Recent Surgery, left UE Lymphedema risk,   ACTIVITY LEVEL / LEISURE: I am back to normal activity    OBJECTIVE:  OBSERVATIONS: Left breast is larger than the right, nipple incision well healed, axillary incision is very red with a rolled inferior edge and some yellow sloughy tissue above this.  PA is okay with healing status.  - incision does not pull open with PROM  PALPATION: firmness possible seroma vs scar tissue medial/inferior to incision not as palpable in the supine position  LYMPHEDEMA ASSESSMENT:   UPPER EXTREMITY AROM/PROM:                          (Blank rows = not tested)   A/PROM LEFT  04/03/2022 05/13/22 08/18/22  Shoulder extension 68    Shoulder flexion 160 163 163  Shoulder abduction 165 165 165  Shoulder internal rotation      Shoulder external rotation 100 95 95 - breast pull                          (Blank rows = not tested)    LYMPHEDEMA ASSESSMENTS:    LANDMARK RIGHT  04/03/2022  10 cm proximal to olecranon process    Olecranon process    10 cm proximal to ulnar styloid process    Just proximal to ulnar styloid process    Across hand at thumb web space    At base of 2nd digit    (Blank rows = not tested)   [facility] LEFT  04/03/2022 05/13/22  10 cm proximal to olecranon process 37 37  Olecranon process 29.'5 30  10 ' cm proximal to ulnar styloid process 24 25  Just proximal to ulnar styloid process 17.1 17.3  Across hand at thumb web space 19.6 19.4  At base of 2nd digit 6.5 6.6  (Blank rows = not tested)    PATIENT EDUCATION:  Education details: POC, briefly self MLD for the Left breast omitting inguinal steps for today Person educated: Patient Education method: Explanation, Demonstration, Tactile cues, Verbal cues, and Handouts Education comprehension: verbalized understanding, returned demonstration, verbal cues required, tactile cues required, and needs further education  HOME EXERCISE PROGRAM:  Reviewed previously given post op HEP.  Self MLD Lt breast without inguinal steps for now  TODAY'S TREATMENT  Pt permission and consent throughout each step of examination and treatment with modification and draping if requested when working on sensitive areas 09/08/22 MLD: In Supine: short neck, superficial and deep abdominals, bil axillary nodes and Lt inguinal nodes, anterior inter-axillary and Lt axillo-inguinal anastomosis, then focused on Lt breast, then into Rt S/L for further work to lateral breast, then finished retracing steps in supine   08/25/22 Pulleys into flexion and abduction x 2 min each MLD: In Supine: short neck, superficial and deep abdominals, bil axillary nodes and Lt inguinal nodes, anterior inter-axillary and Lt axillo-inguinal anastomosis, then focused on Lt breast, then into  Rt S/L for further work to lateral breast, then finished retracing steps in supine  STM to axilla and pectoralis  in overhead Y position and out to the side  08/18/22 Reassessed status - pt continues with enlarged pores medially, slight redness overall post radiation, some warmth but not infected.   Rechecked AROM which has slight more pull in overhead Y position MLD: In Supine: short neck, superficial and deep abdominals, bil axillary nodes and Lt inguinal nodes, anterior inter-axillary and Lt axillo-inguinal anastomosis, then focused on Lt breast, then into Rt S/L for further work to lateral breast, then finished retracing steps in supine  Showed pt flexitouch and will send demo in today  ASSESSMENT: CLINICAL IMPRESSION: A bit more red and warm today but still seeming more like post radiation vs infection but pt will let radiation know if anything persists or worsens.   Pt will benefit from skilled therapeutic intervention to improve on the following deficits: Decreased knowledge of precautions, impaired UE functional use, pain, decreased ROM, postural dysfunction.   PT treatment/interventions: ADL/Self care home management, Therapeutic exercises, Patient/Family education, Manual lymph drainage, Compression bandaging, Taping, Manual therapy, and Re-evaluation  GOALS: Goals reviewed with patient? Yes  LONG TERM GOALS:  (STG=LTG)  GOALS Name Target Date (Remove Blue Hyperlink) Goal status  1 Pt will demonstrate she has regained full shoulder ROM and function post operatively compared to baselines.  Baseline: 05/13/22 MET  2 Pt will be ind with self MLD for the left breast 07/02/22 MET  3 Pt will decrease heaviness of the breast by at least 50% 09/29/22 MET  4 Pt will decrease breast edema questionnaire to at least 25/80 09/29/22 NEW     PLAN: PT FREQUENCY/DURATION: 1-2x per week up to 6 weeks  PLAN FOR NEXT SESSION: Lt breast MLD and self MLD review, review flexi benefits as able, taping?     Shan Levans, PT  09/08/22 9:53 AM       Manual Lymph Drainage for Left Breast.   Do daily.  Do  slowly. Use flat hands with just enough pressure to stretch the skin. Do not slide over the skin   (Stretch  Relax  Move) Lie down or sit comfortably (in a recliner, for example) to do this.   Do circles at each collar bone near neck 5-7 times (to "wake up" lots of lymph nodes in this area).  Take slow deep breaths, allowing your belly to balloon out as you breathe in, 5x (to "wake up" abdominal lymph nodes).  Both armpits--stretch skin in small circles to stimulate intact lymph nodes there, 5-7x.  Redirect fluid from left chest toward right armpit (stretch skin starting at left chest in 3-4 spots working toward right armpit) 3-4x across the chest.  Draw an imaginary diagonal line from upper outer breast through the nipple area toward lower inner breast.  Direct fluid upward and inward from this line toward the pathway across your upper chest (established in #5).  Do this in three rows to treat all the upper inner breast and do each row 3-4x.  Then direct the fluid down and out from this line toward the pathway down your side going towards the left groin. Do this in three rows and do each row 3-4x. (established in #6)   Then repeat your pathways (#5 and #6)  End with repeating #3 and #4 above. (circles in both armpits and the left groin)

## 2022-09-12 ENCOUNTER — Other Ambulatory Visit: Payer: Self-pay

## 2022-09-12 ENCOUNTER — Inpatient Hospital Stay: Payer: BC Managed Care – PPO | Attending: Hematology and Oncology | Admitting: Licensed Clinical Social Worker

## 2022-09-12 DIAGNOSIS — Z6837 Body mass index (BMI) 37.0-37.9, adult: Secondary | ICD-10-CM | POA: Diagnosis not present

## 2022-09-12 DIAGNOSIS — Z5111 Encounter for antineoplastic chemotherapy: Secondary | ICD-10-CM | POA: Insufficient documentation

## 2022-09-12 DIAGNOSIS — Z170 Estrogen receptor positive status [ER+]: Secondary | ICD-10-CM | POA: Insufficient documentation

## 2022-09-12 DIAGNOSIS — J029 Acute pharyngitis, unspecified: Secondary | ICD-10-CM | POA: Diagnosis not present

## 2022-09-12 DIAGNOSIS — C50412 Malignant neoplasm of upper-outer quadrant of left female breast: Secondary | ICD-10-CM | POA: Insufficient documentation

## 2022-09-12 NOTE — Progress Notes (Signed)
[place] CSW Counseling Note  Patient was referred by self. Treatment type: Individual  Presenting Concerns: Patient and/or family reports the following symptoms/concerns: stress and post-cancer adjustment Duration of problem:  weeks; Severity of problem: moderate   Orientation:oriented to person, place, time/date, and situation.   Affect: Appropriate and Congruent   Patient and/or Family's Strengths/Protective Factors: Concrete supports in place (healthy food, safe environments, etc.)Ability for insight  Capable of independent living  Communication skills  Work skills      Goals Addressed: Patient will:  Reduce symptoms of: stress Increase knowledge and/or ability of: coping skills and stress reduction  Increase healthy adjustment to current life circumstances and Increase healthy communication with family   Progress towards Goals: Met   Interventions: Interventions utilized:  Solution Focused and Supportive      Assessment: Patient is doing well since last visit with improvements in stress, more patience, increased self-care, and more processing of emotions and experience. Pt feels the concerns now are more related to things that existed prior to cancer.      Plan: Follow up with CSW: as needed Behavioral recommendations: Doristine Devoid progress! Continue utilizing skills and tools that are helpful for you and continue communicating with your family and supports.   Referral(s): signed up for [place], LCSW

## 2022-09-15 ENCOUNTER — Encounter: Payer: Self-pay | Admitting: Rehabilitation

## 2022-09-15 ENCOUNTER — Ambulatory Visit: Payer: BC Managed Care – PPO | Admitting: Rehabilitation

## 2022-09-15 DIAGNOSIS — I890 Lymphedema, not elsewhere classified: Secondary | ICD-10-CM

## 2022-09-15 DIAGNOSIS — R293 Abnormal posture: Secondary | ICD-10-CM | POA: Diagnosis not present

## 2022-09-15 DIAGNOSIS — C50412 Malignant neoplasm of upper-outer quadrant of left female breast: Secondary | ICD-10-CM

## 2022-09-15 DIAGNOSIS — Z170 Estrogen receptor positive status [ER+]: Secondary | ICD-10-CM | POA: Diagnosis not present

## 2022-09-15 DIAGNOSIS — Z483 Aftercare following surgery for neoplasm: Secondary | ICD-10-CM

## 2022-09-15 NOTE — Therapy (Signed)
OUTPATIENT PHYSICAL THERAPY TREATMENT   Patient Name: Caitlin [name] MRN: [id number] DOB:[birth year], 51 y.o., female Today's Date: 09/15/2022   PT End of Session - 09/15/22 0900     Visit Number 15    Number of Visits 20    Date for PT Re-Evaluation 09/29/22    PT Start Time 0900    PT Stop Time 7543    PT Time Calculation (min) 51 min    Activity Tolerance Patient tolerated treatment well    Behavior During Therapy WFL for tasks assessed/performed                Past Medical History:  Diagnosis Date   Anxiety    Back pain    Edema of both lower extremities    Elevated cholesterol    Endometriosis    Endometriosis    Family history of kidney cancer    Family history of ovarian cancer    Gallbladder problem    High cholesterol    History of IBS    IBS (irritable bowel syndrome)    Infertility, female    Vitamin D deficiency    Past Surgical History:  Procedure Laterality Date   BREAST LUMPECTOMY WITH RADIOACTIVE SEED AND SENTINEL LYMPH NODE BIOPSY Left 04/22/2022   Procedure: LEFT BREAST LUMPECTOMY WITH RADIOACTIVE SEED;  Surgeon: Coralie Keens, MD;  Location: [place];  Service: General;  Laterality: Left;  LMA   CHOLECYSTECTOMY  2018   LAPAROSCOPY     SENTINEL NODE BIOPSY Left 04/22/2022   Procedure: LEFT SENTINEL LYMPH NODE BIOPSY;  Surgeon: Coralie Keens, MD;  Location: [place];  Service: General;  Laterality: Left;   Patient Active Problem List   Diagnosis Date Noted   Genetic testing 04/29/2022   Family history of ovarian cancer 04/15/2022   Family history of kidney cancer 04/15/2022   Malignant neoplasm of upper-outer quadrant of left breast in female, estrogen receptor positive ([place]) 04/02/2022   Vitamin D insufficiency 02/18/2021   Elevated cholesterol 02/18/2021   Class 1 obesity due to excess calories with body mass index (BMI) of 34.0 to 34.9 in adult 02/07/2021   Irritable bowel syndrome 02/07/2021    PCP: Horald Pollen,  MD  REFERRING PROVIDER: Coralie Keens, MD  REFERRING DIAG: Lt breast cancer  THERAPY DIAG:  Aftercare following surgery for neoplasm  Abnormal posture  Malignant neoplasm of upper-outer quadrant of left breast in female, estrogen receptor positive ([place])  Lymphedema, not elsewhere classified  ONSET DATE: 03/29/22  SUBJECTIVE:  SUBJECTIVE STATEMENT: I had a painful weekend  It was Sunday - lasted all day.  Maybe because I didn't sleep in my bra.    PERTINENT HISTORY:  Patient was diagnosed with left IDC. ER/PR positive, HER2 negative KI67 less than 5%. Pt had left lumpectomy and SLNB on 04/22/22 with Dr. [name]. Radiation. No chemotherapy.  Radiation completed 07/09/22. Starting tamoxifen.   PATIENT GOALS:  Reassess how my recovery is going related to arm function, pain, and swelling.  PAIN:  Are you having pain? Sometimes I have days where the seroma is tender and sometimes it is fine  PRECAUTIONS: Recent Surgery, left UE Lymphedema risk,   ACTIVITY LEVEL / LEISURE: I am back to normal activity    OBJECTIVE:  OBSERVATIONS: Left breast is larger than the right, nipple incision well healed, axillary incision is very red with a rolled inferior edge and some yellow sloughy tissue above this.  PA is okay with healing status.  - incision does not pull open with PROM  PALPATION: firmness possible seroma vs scar tissue medial/inferior to incision not as palpable in the supine position  LYMPHEDEMA ASSESSMENT:   UPPER EXTREMITY AROM/PROM:                          (Blank rows = not tested)   A/PROM LEFT  04/03/2022 05/13/22 08/18/22 09/15/22  Shoulder extension 68     Shoulder flexion 160 163 163 165  Shoulder abduction 165 165 165 165  Shoulder internal rotation       Shoulder external rotation 100 95 95  - breast pull 95                          (Blank rows = not tested)   LYMPHEDEMA ASSESSMENTS:    LANDMARK RIGHT  04/03/2022  10 cm proximal to olecranon process    Olecranon process    10 cm proximal to ulnar styloid process    Just proximal to ulnar styloid process    Across hand at thumb web space    At base of 2nd digit    (Blank rows = not tested)   [facility] LEFT  04/03/2022 05/13/22  10 cm proximal to olecranon process 37 37  Olecranon process 29.'5 30  10 ' cm proximal to ulnar styloid process 24 25  Just proximal to ulnar styloid process 17.1 17.3  Across hand at thumb web space 19.6 19.4  At base of 2nd digit 6.5 6.6  (Blank rows = not tested)    PATIENT EDUCATION:  Education details: POC, briefly self MLD for the Left breast omitting inguinal steps for today Person educated: Patient Education method: Explanation, Demonstration, Tactile cues, Verbal cues, and Handouts Education comprehension: verbalized understanding, returned demonstration, verbal cues required, tactile cues required, and needs further education  HOME EXERCISE PROGRAM:  Reviewed previously given post op HEP.  Self MLD Lt breast without inguinal steps for now  TODAY'S TREATMENT  Pt permission and consent throughout each step of examination and treatment with modification and draping if requested when working on sensitive areas 09/15/22 Pulleys flexion and abduction x 72mn each Wall abduction with ball 10" x 5 left only MLD: In Supine: short neck, superficial and deep abdominals, bil axillary nodes and Lt inguinal nodes, anterior inter-axillary and Lt axillo-inguinal anastomosis, then focused on Lt breast, then into Rt S/L for further work to lateral breast, then finished retracing steps in supine  STM/release of  Lt axilla in arm overhead position with cocoa butter Gave pt foam chip pack rectangle to use for increasing inferior/lateral fibrosis Printed swell spot sheet but forgot to issue.   09/08/22 MLD: In  Supine: short neck, superficial and deep abdominals, bil axillary nodes and Lt inguinal nodes, anterior inter-axillary and Lt axillo-inguinal anastomosis, then focused on Lt breast, then into Rt S/L for further work to lateral breast, then finished retracing steps in supine   08/25/22 Pulleys into flexion and abduction x 2 min each MLD: In Supine: short neck, superficial and deep abdominals, bil axillary nodes and Lt inguinal nodes, anterior inter-axillary and Lt axillo-inguinal anastomosis, then focused on Lt breast, then into Rt S/L for further work to lateral breast, then finished retracing steps in supine  STM to axilla and pectoralis in overhead Y position and out to the side  ASSESSMENT: CLINICAL IMPRESSION: Pt had a bit more inferior/lateral breast pain over the weekend and seems to have a bit more fibrosis in this area.  Started with chip pack.    Pt will benefit from skilled therapeutic intervention to improve on the following deficits: Decreased knowledge of precautions, impaired UE functional use, pain, decreased ROM, postural dysfunction.   PT treatment/interventions: ADL/Self care home management, Therapeutic exercises, Patient/Family education, Manual lymph drainage, Compression bandaging, Taping, Manual therapy, and Re-evaluation  GOALS: Goals reviewed with patient? Yes  LONG TERM GOALS:  (STG=LTG)  GOALS Name Target Date (Remove Blue Hyperlink) Goal status  1 Pt will demonstrate she has regained full shoulder ROM and function post operatively compared to baselines.  Baseline: 05/13/22 MET  2 Pt will be ind with self MLD for the left breast 07/02/22 MET  3 Pt will decrease heaviness of the breast by at least 50% 09/29/22 MET  4 Pt will decrease breast edema questionnaire to at least 25/80 09/29/22 NEW     PLAN: PT FREQUENCY/DURATION: 1-2x per week up to 6 weeks  PLAN FOR NEXT SESSION: Lt breast MLD and axillary release, - issue swell spot handout for purchasing.       Shan Levans, PT  09/15/22 11:50 AM       Manual Lymph Drainage for Left Breast.   Do daily.  Do slowly. Use flat hands with just enough pressure to stretch the skin. Do not slide over the skin   (Stretch  Relax  Move) Lie down or sit comfortably (in a recliner, for example) to do this.   Do circles at each collar bone near neck 5-7 times (to "wake up" lots of lymph nodes in this area).  Take slow deep breaths, allowing your belly to balloon out as you breathe in, 5x (to "wake up" abdominal lymph nodes).  Both armpits--stretch skin in small circles to stimulate intact lymph nodes there, 5-7x.  Redirect fluid from left chest toward right armpit (stretch skin starting at left chest in 3-4 spots working toward right armpit) 3-4x across the chest.  Draw an imaginary diagonal line from upper outer breast through the nipple area toward lower inner breast.  Direct fluid upward and inward from this line toward the pathway across your upper chest (established in #5).  Do this in three rows to treat all the upper inner breast and do each row 3-4x.  Then direct the fluid down and out from this line toward the pathway down your side going towards the left groin. Do this in three rows and do each row 3-4x. (established in #6)   Then repeat your pathways (#5 and #  6)  End with repeating #3 and #4 above. (circles in both armpits and the left groin)

## 2022-09-16 ENCOUNTER — Ambulatory Visit
Admission: RE | Admit: 2022-09-16 | Discharge: 2022-09-16 | Disposition: A | Discharge status: Home / Self Care | Payer: BC Managed Care – PPO | Source: Ambulatory Visit | Attending: Hematology and Oncology | Admitting: Hematology and Oncology

## 2022-09-16 DIAGNOSIS — C50412 Malignant neoplasm of upper-outer quadrant of left female breast: Secondary | ICD-10-CM

## 2022-09-16 DIAGNOSIS — Z13820 Encounter for screening for osteoporosis: Secondary | ICD-10-CM | POA: Diagnosis not present

## 2022-09-16 NOTE — Progress Notes (Signed)
Rn Faxed Prescription  for [phone] Pneumatic compression device flexitouch sysytem

## 2022-09-19 DIAGNOSIS — I890 Lymphedema, not elsewhere classified: Secondary | ICD-10-CM | POA: Diagnosis not present

## 2022-09-22 ENCOUNTER — Inpatient Hospital Stay: Payer: BC Managed Care – PPO

## 2022-09-22 ENCOUNTER — Other Ambulatory Visit: Payer: Self-pay

## 2022-09-22 VITALS — BP 137/84 | HR 78 | Temp 98.6°F | Resp 18

## 2022-09-22 DIAGNOSIS — C50412 Malignant neoplasm of upper-outer quadrant of left female breast: Secondary | ICD-10-CM | POA: Diagnosis not present

## 2022-09-22 DIAGNOSIS — Z5111 Encounter for antineoplastic chemotherapy: Secondary | ICD-10-CM | POA: Diagnosis not present

## 2022-09-22 DIAGNOSIS — Z170 Estrogen receptor positive status [ER+]: Secondary | ICD-10-CM | POA: Diagnosis not present

## 2022-09-22 MED ORDER — GOSERELIN ACETATE 3.6 MG ~~LOC~~ IMPL
3.6000 mg | DRUG_IMPLANT | Freq: Once | SUBCUTANEOUS | Status: AC
  Administered 2022-09-22: 3.6 mg via SUBCUTANEOUS
  Filled 2022-09-22: qty 3.6

## 2022-09-23 ENCOUNTER — Ambulatory Visit: Payer: BC Managed Care – PPO | Admitting: Rehabilitation

## 2022-09-23 ENCOUNTER — Encounter: Payer: Self-pay | Admitting: Rehabilitation

## 2022-09-23 DIAGNOSIS — I890 Lymphedema, not elsewhere classified: Secondary | ICD-10-CM

## 2022-09-23 DIAGNOSIS — Z483 Aftercare following surgery for neoplasm: Secondary | ICD-10-CM | POA: Diagnosis not present

## 2022-09-23 DIAGNOSIS — Z170 Estrogen receptor positive status [ER+]: Secondary | ICD-10-CM | POA: Diagnosis not present

## 2022-09-23 DIAGNOSIS — B349 Viral infection, unspecified: Secondary | ICD-10-CM | POA: Diagnosis not present

## 2022-09-23 DIAGNOSIS — R293 Abnormal posture: Secondary | ICD-10-CM | POA: Diagnosis not present

## 2022-09-23 DIAGNOSIS — C50412 Malignant neoplasm of upper-outer quadrant of left female breast: Secondary | ICD-10-CM | POA: Diagnosis not present

## 2022-09-23 DIAGNOSIS — J029 Acute pharyngitis, unspecified: Secondary | ICD-10-CM | POA: Diagnosis not present

## 2022-09-23 DIAGNOSIS — C50912 Malignant neoplasm of unspecified site of left female breast: Secondary | ICD-10-CM | POA: Diagnosis not present

## 2022-09-23 DIAGNOSIS — Z20822 Contact with and (suspected) exposure to covid-19: Secondary | ICD-10-CM | POA: Diagnosis not present

## 2022-09-23 NOTE — Therapy (Signed)
OUTPATIENT PHYSICAL THERAPY TREATMENT   Patient Name: Caitlin [name] MRN: [id number] DOB:November 17, 1971, 51 y.o., female Today's Date: 09/23/2022   PT End of Session - 09/23/22 0842     Visit Number 16    Number of Visits 20    Date for PT Re-Evaluation 09/29/22    PT Start Time 0800    PT Stop Time 0842    PT Time Calculation (min) 42 min    Activity Tolerance Patient tolerated treatment well    Behavior During Therapy WFL for tasks assessed/performed                 Past Medical History:  Diagnosis Date   Anxiety    Back pain    Edema of both lower extremities    Elevated cholesterol    Endometriosis    Endometriosis    Family history of kidney cancer    Family history of ovarian cancer    Gallbladder problem    High cholesterol    History of IBS    IBS (irritable bowel syndrome)    Infertility, female    Vitamin D deficiency    Past Surgical History:  Procedure Laterality Date   BREAST LUMPECTOMY WITH RADIOACTIVE SEED AND SENTINEL LYMPH NODE BIOPSY Left 04/22/2022   Procedure: LEFT BREAST LUMPECTOMY WITH RADIOACTIVE SEED;  Surgeon: Coralie Keens, MD;  Location: [place];  Service: General;  Laterality: Left;  LMA   CHOLECYSTECTOMY  2018   LAPAROSCOPY     SENTINEL NODE BIOPSY Left 04/22/2022   Procedure: LEFT SENTINEL LYMPH NODE BIOPSY;  Surgeon: Coralie Keens, MD;  Location: [place];  Service: General;  Laterality: Left;   Patient Active Problem List   Diagnosis Date Noted   Genetic testing 04/29/2022   Family history of ovarian cancer 04/15/2022   Family history of kidney cancer 04/15/2022   Malignant neoplasm of upper-outer quadrant of left breast in female, estrogen receptor positive ([place]) 04/02/2022   Vitamin D insufficiency 02/18/2021   Elevated cholesterol 02/18/2021   Class 1 obesity due to excess calories with body mass index (BMI) of 34.0 to 34.9 in adult 02/07/2021   Irritable bowel syndrome 02/07/2021    PCP: Horald Pollen,  MD  REFERRING PROVIDER: Coralie Keens, MD  REFERRING DIAG: Lt breast cancer  THERAPY DIAG:  Aftercare following surgery for neoplasm  Abnormal posture  Malignant neoplasm of upper-outer quadrant of left breast in female, estrogen receptor positive ([place])  Lymphedema, not elsewhere classified  ONSET DATE: 03/29/22  SUBJECTIVE:  SUBJECTIVE STATEMENT: It has been good since it was hurting last week. I should get my pump this week  PERTINENT HISTORY:  Patient was diagnosed with left IDC. ER/PR positive, HER2 negative KI67 less than 5%. Pt had left lumpectomy and SLNB on 04/22/22 with Dr. [name]. Radiation. No chemotherapy.  Radiation completed 07/09/22. Starting tamoxifen.   PATIENT GOALS:  Reassess how my recovery is going related to arm function, pain, and swelling.  PAIN:  Are you having pain? Sometimes I have days where the seroma is tender and sometimes it is fine  PRECAUTIONS: Recent Surgery, left UE Lymphedema risk,   ACTIVITY LEVEL / LEISURE: I am back to normal activity    OBJECTIVE:  OBSERVATIONS: Left breast is larger than the right, nipple incision well healed, axillary incision is very red with a rolled inferior edge and some yellow sloughy tissue above this.  PA is okay with healing status.  - incision does not pull open with PROM  PALPATION: firmness possible seroma vs scar tissue medial/inferior to incision not as palpable in the supine position  LYMPHEDEMA ASSESSMENT:   UPPER EXTREMITY AROM/PROM:                          (Blank rows = not tested)   A/PROM LEFT  04/03/2022 05/13/22 08/18/22 09/15/22  Shoulder extension 68     Shoulder flexion 160 163 163 165  Shoulder abduction 165 165 165 165  Shoulder internal rotation       Shoulder external rotation 100 95 95 - breast pull 95                           (Blank rows = not tested)   LYMPHEDEMA ASSESSMENTS:    LANDMARK RIGHT  04/03/2022  10 cm proximal to olecranon process    Olecranon process    10 cm proximal to ulnar styloid process    Just proximal to ulnar styloid process    Across hand at thumb web space    At base of 2nd digit    (Blank rows = not tested)   [facility] LEFT  04/03/2022 05/13/22  10 cm proximal to olecranon process 37 37  Olecranon process 29.'5 30  10 ' cm proximal to ulnar styloid process 24 25  Just proximal to ulnar styloid process 17.1 17.3  Across hand at thumb web space 19.6 19.4  At base of 2nd digit 6.5 6.6  (Blank rows = not tested)    PATIENT EDUCATION:  Education details: POC, briefly self MLD for the Left breast omitting inguinal steps for today Person educated: Patient Education method: Explanation, Demonstration, Tactile cues, Verbal cues, and Handouts Education comprehension: verbalized understanding, returned demonstration, verbal cues required, tactile cues required, and needs further education  HOME EXERCISE PROGRAM:  Reviewed previously given post op HEP.  Self MLD Lt breast without inguinal steps for now  TODAY'S TREATMENT  Pt permission and consent throughout each step of examination and treatment with modification and draping if requested when working on sensitive areas 09/15/22 Pulleys flexion and abduction x 97mn each Wall abduction with ball 10" x 5 left only MLD: In Supine: short neck, superficial and deep abdominals, bil axillary nodes and Lt inguinal nodes, anterior inter-axillary and Lt axillo-inguinal anastomosis, then focused on Lt breast, then into Rt S/L for further work to lateral breast, then finished retracing steps in supine  STM/release of Lt axilla in arm overhead position with  cocoa butter Printed swell spot sheet  09/15/22 Pulleys flexion and abduction x 66mn each Wall abduction with ball 10" x 5 left only MLD: In Supine: short neck, superficial and  deep abdominals, bil axillary nodes and Lt inguinal nodes, anterior inter-axillary and Lt axillo-inguinal anastomosis, then focused on Lt breast, then into Rt S/L for further work to lateral breast, then finished retracing steps in supine  STM/release of Lt axilla in arm overhead position with cocoa butter Gave pt foam chip pack rectangle to use for increasing inferior/lateral fibrosis Printed swell spot sheet but forgot to issue.   09/08/22 MLD: In Supine: short neck, superficial and deep abdominals, bil axillary nodes and Lt inguinal nodes, anterior inter-axillary and Lt axillo-inguinal anastomosis, then focused on Lt breast, then into Rt S/L for further work to lateral breast, then finished retracing steps in supine   ASSESSMENT: CLINICAL IMPRESSION: Pt has a bit more medial fibrosis that softened with MT today but overall is not feeling very tight with improved edema.  Pt should get her pump this week and was encouraged to use her swell spot in the medial spot.   Pt will benefit from skilled therapeutic intervention to improve on the following deficits: Decreased knowledge of precautions, impaired UE functional use, pain, decreased ROM, postural dysfunction.   PT treatment/interventions: ADL/Self care home management, Therapeutic exercises, Patient/Family education, Manual lymph drainage, Compression bandaging, Taping, Manual therapy, and Re-evaluation  GOALS: Goals reviewed with patient? Yes  LONG TERM GOALS:  (STG=LTG)  GOALS Name Target Date (Remove Blue Hyperlink) Goal status  1 Pt will demonstrate she has regained full shoulder ROM and function post operatively compared to baselines.  Baseline: 05/13/22 MET  2 Pt will be ind with self MLD for the left breast 07/02/22 MET  3 Pt will decrease heaviness of the breast by at least 50% 09/29/22 MET  4 Pt will decrease breast edema questionnaire to at least 25/80 09/29/22 NEW     PLAN: PT FREQUENCY/DURATION: 1-2x per week up to 6  weeks  PLAN FOR NEXT SESSION: Lt breast MLD and axillary release, - issue swell spot handout for purchasing.      KShan Levans PT  09/23/22 8:43 AM       Manual Lymph Drainage for Left Breast.   Do daily.  Do slowly. Use flat hands with just enough pressure to stretch the skin. Do not slide over the skin   (Stretch  Relax  Move) Lie down or sit comfortably (in a recliner, for example) to do this.   Do circles at each collar bone near neck 5-7 times (to "wake up" lots of lymph nodes in this area).  Take slow deep breaths, allowing your belly to balloon out as you breathe in, 5x (to "wake up" abdominal lymph nodes).  Both armpits--stretch skin in small circles to stimulate intact lymph nodes there, 5-7x.  Redirect fluid from left chest toward right armpit (stretch skin starting at left chest in 3-4 spots working toward right armpit) 3-4x across the chest.  Draw an imaginary diagonal line from upper outer breast through the nipple area toward lower inner breast.  Direct fluid upward and inward from this line toward the pathway across your upper chest (established in #5).  Do this in three rows to treat all the upper inner breast and do each row 3-4x.  Then direct the fluid down and out from this line toward the pathway down your side going towards the left groin. Do this in three rows and  do each row 3-4x. (established in #6)   Then repeat your pathways (#5 and #6)  End with repeating #3 and #4 above. (circles in both armpits and the left groin)

## 2022-09-30 ENCOUNTER — Ambulatory Visit: Payer: BC Managed Care – PPO | Attending: Radiation Oncology | Admitting: Rehabilitation

## 2022-09-30 ENCOUNTER — Encounter: Payer: Self-pay | Admitting: Rehabilitation

## 2022-09-30 DIAGNOSIS — R293 Abnormal posture: Secondary | ICD-10-CM | POA: Diagnosis not present

## 2022-09-30 DIAGNOSIS — I890 Lymphedema, not elsewhere classified: Secondary | ICD-10-CM | POA: Insufficient documentation

## 2022-09-30 DIAGNOSIS — Z483 Aftercare following surgery for neoplasm: Secondary | ICD-10-CM | POA: Insufficient documentation

## 2022-09-30 DIAGNOSIS — C50412 Malignant neoplasm of upper-outer quadrant of left female breast: Secondary | ICD-10-CM | POA: Insufficient documentation

## 2022-09-30 DIAGNOSIS — Z170 Estrogen receptor positive status [ER+]: Secondary | ICD-10-CM | POA: Diagnosis not present

## 2022-09-30 NOTE — Therapy (Signed)
OUTPATIENT PHYSICAL THERAPY TREATMENT   Patient Name: Caitlin [name] MRN: [id number] DOB:Apr 15, 1971, 51 y.o., female Today's Date: 09/30/2022   PT End of Session - 09/30/22 0803     Visit Number 17    Number of Visits 20    PT Start Time 0803    PT Stop Time 0850    PT Time Calculation (min) 47 min    Activity Tolerance Patient tolerated treatment well    Behavior During Therapy WFL for tasks assessed/performed                  Past Medical History:  Diagnosis Date   Anxiety    Back pain    Edema of both lower extremities    Elevated cholesterol    Endometriosis    Endometriosis    Family history of kidney cancer    Family history of ovarian cancer    Gallbladder problem    High cholesterol    History of IBS    IBS (irritable bowel syndrome)    Infertility, female    Vitamin D deficiency    Past Surgical History:  Procedure Laterality Date   BREAST LUMPECTOMY WITH RADIOACTIVE SEED AND SENTINEL LYMPH NODE BIOPSY Left 04/22/2022   Procedure: LEFT BREAST LUMPECTOMY WITH RADIOACTIVE SEED;  Surgeon: Coralie Keens, MD;  Location: [place];  Service: General;  Laterality: Left;  LMA   CHOLECYSTECTOMY  2018   LAPAROSCOPY     SENTINEL NODE BIOPSY Left 04/22/2022   Procedure: LEFT SENTINEL LYMPH NODE BIOPSY;  Surgeon: Coralie Keens, MD;  Location: [place];  Service: General;  Laterality: Left;   Patient Active Problem List   Diagnosis Date Noted   Genetic testing 04/29/2022   Family history of ovarian cancer 04/15/2022   Family history of kidney cancer 04/15/2022   Malignant neoplasm of upper-outer quadrant of left breast in female, estrogen receptor positive ([place]) 04/02/2022   Vitamin D insufficiency 02/18/2021   Elevated cholesterol 02/18/2021   Class 1 obesity due to excess calories with body mass index (BMI) of 34.0 to 34.9 in adult 02/07/2021   Irritable bowel syndrome 02/07/2021    PCP: Horald Pollen, MD  REFERRING PROVIDER: Coralie Keens,  MD  REFERRING DIAG: Lt breast cancer  THERAPY DIAG:  Aftercare following surgery for neoplasm  Abnormal posture  Malignant neoplasm of upper-outer quadrant of left breast in female, estrogen receptor positive ([place])  Lymphedema, not elsewhere classified  ONSET DATE: 03/29/22  SUBJECTIVE:  SUBJECTIVE STATEMENT: I got my pump and my new foam insert.  It seems to soften up faster than before.  My the end of the day it still feels heavy.    PERTINENT HISTORY:  Patient was diagnosed with left IDC. ER/PR positive, HER2 negative KI67 less than 5%. Pt had left lumpectomy and SLNB on 04/22/22 with Dr. [name]. Radiation. No chemotherapy.  Radiation completed 07/09/22. Starting tamoxifen.   PATIENT GOALS:  Reassess how my recovery is going related to arm function, pain, and swelling.  PAIN:  Are you having pain? Sometimes I have days where the seroma is tender and sometimes it is fine  PRECAUTIONS: Recent Surgery, left UE Lymphedema risk,   ACTIVITY LEVEL / LEISURE: I am back to normal activity    OBJECTIVE:  OBSERVATIONS: Left breast is larger than the right, nipple incision well healed, axillary incision is very red with a rolled inferior edge and some yellow sloughy tissue above this.  PA is okay with healing status.  - incision does not pull open with PROM 09/30/22: breast is less red, still overall swollen, more mildly enlarged pores  PALPATION: firmness possible seroma vs scar tissue medial/inferior to incision not as palpable in the supine position 09/30/22: less fibrosis, more overall general edema, seroma/scar tissue softening  LYMPHEDEMA ASSESSMENT:   Edema questionnaire: 51/80 on 07/24/22, 46/80 on 09/30/22  UPPER EXTREMITY AROM/PROM:                          (Blank rows = not tested)   A/PROM LEFT   04/03/2022 05/13/22 08/18/22 09/15/22  Shoulder extension 68     Shoulder flexion 160 163 163 165  Shoulder abduction 165 165 165 165  Shoulder internal rotation       Shoulder external rotation 100 95 95 - breast pull 95                          (Blank rows = not tested)   LYMPHEDEMA ASSESSMENTS:    LANDMARK RIGHT  04/03/2022  10 cm proximal to olecranon process    Olecranon process    10 cm proximal to ulnar styloid process    Just proximal to ulnar styloid process    Across hand at thumb web space    At base of 2nd digit    (Blank rows = not tested)   [facility] LEFT  04/03/2022 05/13/22  10 cm proximal to olecranon process 37 37  Olecranon process 29._0 cm proximal to ulnar styloid process 24 25  Just proximal to ulnar styloid process 17.1 17.3  Across hand at thumb web space 19.6 19.4  At base of 2nd digit 6.5 6.6  (Blank rows = not tested)    PATIENT EDUCATION:  Education details: POC, briefly self MLD for the Left breast omitting inguinal steps for today Person educated: Patient Education method: Explanation, Demonstration, Tactile cues, Verbal cues, and Handouts Education comprehension: verbalized understanding, returned demonstration, verbal cues required, tactile cues required, and needs further education  HOME EXERCISE PROGRAM:  Reviewed previously given post op HEP.  Self MLD Lt breast without inguinal steps for now  TODAY'S TREATMENT  Pt permission and consent throughout each step of examination and treatment with modification and draping if requested when working on sensitive areas 09/30/22 MLD: In Supine: short neck, superficial and deep abdominals, bil axillary nodes and Lt inguinal nodes, anterior inter-axillary and Lt axillo-inguinal anastomosis, then focused  on Lt breast, then into Rt S/L for further work to lateral breast, then finished retracing steps in supine  STM/release of Lt axilla in arm overhead position with cocoa butter Review of how to care for  pump  09/23/22 Pulleys flexion and abduction x 56mn each Wall abduction with ball 10" x 5 left only MLD: In Supine: short neck, superficial and deep abdominals, bil axillary nodes and Lt inguinal nodes, anterior inter-axillary and Lt axillo-inguinal anastomosis, then focused on Lt breast, then into Rt S/L for further work to lateral breast, then finished retracing steps in supine  STM/release of Lt axilla in arm overhead position with cocoa butter  09/15/22 Pulleys flexion and abduction x 268m each Wall abduction with ball 10" x 5 left only MLD: In Supine: short neck, superficial and deep abdominals, bil axillary nodes and Lt inguinal nodes, anterior inter-axillary and Lt axillo-inguinal anastomosis, then focused on Lt breast, then into Rt S/L for further work to lateral breast, then finished retracing steps in supine  STM/release of Lt axilla in arm overhead position with cocoa butter Gave pt foam chip pack rectangle to use for increasing inferior/lateral fibrosis Printed swell spot sheet but forgot to issue.   09/08/22 MLD: In Supine: short neck, superficial and deep abdominals, bil axillary nodes and Lt inguinal nodes, anterior inter-axillary and Lt axillo-inguinal anastomosis, then focused on Lt breast, then into Rt S/L for further work to lateral breast, then finished retracing steps in supine   ASSESSMENT: CLINICAL IMPRESSION: Pt will attempt DC today with pump and garments.   Pt will benefit from skilled therapeutic intervention to improve on the following deficits: Decreased knowledge of precautions, impaired UE functional use, pain, decreased ROM, postural dysfunction.   PT treatment/interventions: ADL/Self care home management, Therapeutic exercises, Patient/Family education, Manual lymph drainage, Compression bandaging, Taping, Manual therapy, and Re-evaluation  GOALS: Goals reviewed with patient? Yes  LONG TERM GOALS:  (STG=LTG)  GOALS Name Target Date (Remove Blue  Hyperlink) Goal status  1 Pt will demonstrate she has regained full shoulder ROM and function post operatively compared to baselines.  Baseline: 05/13/22 MET  2 Pt will be ind with self MLD for the left breast 07/02/22 MET  3 Pt will decrease heaviness of the breast by at least 50% 09/29/22 MET  4 Pt will decrease breast edema questionnaire to at least 25/80 09/29/22 NOT MET     PLAN: PT FREQUENCY/DURATION: 1-2x per week up to 6 weeks  PLAN FOR NEXT SESSION: Lt breast MLD and axillary release, - issue swell spot handout for purchasing.   PHYSICAL THERAPY DISCHARGE SUMMARY  Visits from Start of Care: 17  Current functional level related to goals / functional outcomes: See above   Remaining deficits: Chronic breast lymphedema   Education / Equipment: See above  Plan: Patient agrees to discharge. Patient is being discharged due to meeting the stated rehab goals.          KaShan LevansPT  09/30/22 8:56 AM       Manual Lymph Drainage for Left Breast.   Do daily.  Do slowly. Use flat hands with just enough pressure to stretch the skin. Do not slide over the skin   (Stretch  Relax  Move) Lie down or sit comfortably (in a recliner, for example) to do this.   Do circles at each collar bone near neck 5-7 times (to "wake up" lots of lymph nodes in this area).  Take slow deep breaths, allowing your belly to balloon out as  you breathe in, 5x (to "wake up" abdominal lymph nodes).  Both armpits--stretch skin in small circles to stimulate intact lymph nodes there, 5-7x.  Redirect fluid from left chest toward right armpit (stretch skin starting at left chest in 3-4 spots working toward right armpit) 3-4x across the chest.  Draw an imaginary diagonal line from upper outer breast through the nipple area toward lower inner breast.  Direct fluid upward and inward from this line toward the pathway across your upper chest (established in #5).  Do this in three rows to treat all the  upper inner breast and do each row 3-4x.  Then direct the fluid down and out from this line toward the pathway down your side going towards the left groin. Do this in three rows and do each row 3-4x. (established in #6)   Then repeat your pathways (#5 and #6)  End with repeating #3 and #4 above. (circles in both armpits and the left groin)

## 2022-10-16 DIAGNOSIS — Z23 Encounter for immunization: Secondary | ICD-10-CM | POA: Diagnosis not present

## 2022-10-16 DIAGNOSIS — F39 Unspecified mood [affective] disorder: Secondary | ICD-10-CM | POA: Diagnosis not present

## 2022-10-16 DIAGNOSIS — R635 Abnormal weight gain: Secondary | ICD-10-CM | POA: Diagnosis not present

## 2022-10-16 DIAGNOSIS — Z0001 Encounter for general adult medical examination with abnormal findings: Secondary | ICD-10-CM | POA: Diagnosis not present

## 2022-10-16 DIAGNOSIS — G932 Benign intracranial hypertension: Secondary | ICD-10-CM | POA: Diagnosis not present

## 2022-10-16 DIAGNOSIS — E559 Vitamin D deficiency, unspecified: Secondary | ICD-10-CM | POA: Diagnosis not present

## 2022-10-16 DIAGNOSIS — C50912 Malignant neoplasm of unspecified site of left female breast: Secondary | ICD-10-CM | POA: Diagnosis not present

## 2022-10-16 DIAGNOSIS — Z713 Dietary counseling and surveillance: Secondary | ICD-10-CM | POA: Diagnosis not present

## 2022-10-19 DIAGNOSIS — I890 Lymphedema, not elsewhere classified: Secondary | ICD-10-CM | POA: Diagnosis not present

## 2022-10-20 ENCOUNTER — Ambulatory Visit: Payer: BC Managed Care – PPO | Attending: Surgery

## 2022-10-20 VITALS — Wt 236.1 lb

## 2022-10-20 DIAGNOSIS — Z483 Aftercare following surgery for neoplasm: Secondary | ICD-10-CM | POA: Insufficient documentation

## 2022-10-20 NOTE — Therapy (Signed)
OUTPATIENT PHYSICAL THERAPY SOZO SCREENING NOTE   Patient Name: Caitlin [name] MRN: [id number] DOB:Jun 14, 1971, 51 y.o., female Today's Date: 10/20/2022  PCP: [name], MD REFERRING PROVIDER: [name], MD   PT End of Session - 10/20/22 0915     Visit Number 17   # unchanged due to screen only   PT Start Time 0914    PT Stop Time 0918    PT Time Calculation (min) 4 min    Activity Tolerance Patient tolerated treatment well    Behavior During Therapy WFL for tasks assessed/performed             Past Medical History:  Diagnosis Date   Anxiety    Back pain    Edema of both lower extremities    Elevated cholesterol    Endometriosis    Endometriosis    Family history of kidney cancer    Family history of ovarian cancer    Gallbladder problem    High cholesterol    History of IBS    IBS (irritable bowel syndrome)    Infertility, female    Vitamin D deficiency    Past Surgical History:  Procedure Laterality Date   BREAST LUMPECTOMY WITH RADIOACTIVE SEED AND SENTINEL LYMPH NODE BIOPSY Left 04/22/2022   Procedure: LEFT BREAST LUMPECTOMY WITH RADIOACTIVE SEED;  Surgeon: Coralie Keens, MD;  Location: [place];  Service: General;  Laterality: Left;  LMA   CHOLECYSTECTOMY  2018   LAPAROSCOPY     SENTINEL NODE BIOPSY Left 04/22/2022   Procedure: LEFT SENTINEL LYMPH NODE BIOPSY;  Surgeon: Coralie Keens, MD;  Location: [place];  Service: General;  Laterality: Left;   Patient Active Problem List   Diagnosis Date Noted   Genetic testing 04/29/2022   Family history of ovarian cancer 04/15/2022   Family history of kidney cancer 04/15/2022   Malignant neoplasm of upper-outer quadrant of left breast in female, estrogen receptor positive ([place]) 04/02/2022   Vitamin D insufficiency 02/18/2021   Elevated cholesterol 02/18/2021   Class 1 obesity due to excess calories with body mass index (BMI) of 34.0 to 34.9 in adult 02/07/2021   Irritable bowel syndrome  02/07/2021    REFERRING DIAG: left breast cancer at risk for lymphedema  THERAPY DIAG:  Aftercare following surgery for neoplasm  PERTINENT HISTORY: Patient was diagnosed with left IDC. ER/PR positive, HER2 negative KI67 less than 5%. Pt had left lumpectomy and SLNB on 04/22/22 with Dr. [name]. Radiation. No chemotherapy.  Radiation completed 07/09/22. Starting tamoxifen.   PRECAUTIONS: left UE Lymphedema risk, None  SUBJECTIVE: Pt returns for her 3 month L-Dex screen.   PAIN:  Are you having pain? No  SOZO SCREENING: Patient was assessed today using the SOZO machine to determine the lymphedema index score. This was compared to her baseline score. It was determined that she is within the recommended range when compared to her baseline and no further action is needed at this time. She will continue SOZO screenings. These are done every 3 months for 2 years post operatively followed by every 6 months for 2 years, and then annually.   L-DEX FLOWSHEETS - 10/20/22 0900       L-DEX LYMPHEDEMA SCREENING   Measurement Type Unilateral    L-DEX MEASUREMENT EXTREMITY Upper Extremity    POSITION  Standing    DOMINANT SIDE Right    At Risk Side Left    BASELINE SCORE (UNILATERAL) -2.6    L-DEX SCORE (UNILATERAL) 0.3    VALUE  CHANGE (UNILAT) 2.9               Otelia Limes, PTA 10/20/2022, 9:18 AM

## 2022-10-21 NOTE — Progress Notes (Unsigned)
SURVIVORSHIP VISIT:  BRIEF ONCOLOGIC HISTORY:  Oncology History  Malignant neoplasm of upper-outer quadrant of left breast in female, estrogen receptor positive ([place])  03/10/2022 Mammogram   Mammogram of the left breast showed irregular hypoechoic mass in the left breast.  Targeted ultrasound performed also confirmed an irregular hypoechoic mass in the left breast at 2:00 2 cm from the nipple measuring 1.1 x 0.8 x 0.5 cm.  Left axilla did not show any enlarged lymphadenopathy.   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  Prognostic showed ER 95% positive moderate staining.  PR 100% positive strong staining.  HER2 equivocal by IHC.  FISH negative    04/02/2022 Initial Diagnosis   Malignant neoplasm of upper-outer quadrant of left breast in female, estrogen receptor positive ([place])   04/22/2022 Surgery   She had left breast lumpectomy on April 25 and pathology showed invasive ductal carcinoma Nottingham grade 2 out of 3, 1.0 cm, DCIS, intermediate grade, margins uninvolved by carcinoma at 3 out of 3 sentinel lymph nodes without any evidence of micrometastasis or micrometastasis.  Prognostics on prior biopsy showed ER +95% ER +100% staining HER2 negative, KI of less than 5%   04/25/2022 Genetic Testing   Negative genetic testing on the CancerNext-Expanded+RNainsight panel.  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 The report date is April 25, 2022.  The CancerNext-Expanded gene panel offered by [facility] and includes sequencing and rearrangement analysis for the following 77 genes: AIP, ALK, APC*, ATM*, AXIN2, BAP1, BARD1, BLM, BMPR1A, BRCA1*, BRCA2*, BRIP1*, CDC73, CDH1*, CDK4, CDKN1B, CDKN2A, CHEK2*, CTNNA1, DICER1, FANCC, FH, FLCN, GALNT12, KIF1B, LZTR1, MAX, MEN1, MET, MLH1*, MSH2*, MSH3, MSH6*, MUTYH*, NBN, NF1*, NF2, NTHL1, PALB2*, PHOX2B, PMS2*, POT1, PRKAR1A, PTCH1,  PTEN*, RAD51C*, RAD51D*, RB1, RECQL, RET, SDHA, SDHAF2, SDHB, SDHC, SDHD, SMAD4, SMARCA4, SMARCB1, SMARCE1, STK11, SUFU, TMEM127, TP53*, TSC1, TSC2, VHL and XRCC2 (sequencing and deletion/duplication); EGFR, EGLN1, HOXB13, KIT, MITF, PDGFRA, POLD1, and POLE (sequencing only); EPCAM and GREM1 (deletion/duplication only). DNA and RNA analyses performed for * genes.    05/07/2022 Oncotype testing   Oncotype recurrence score of 16, distant recurrence risk at 9 years is 4%, group average absolute chemotherapy benefit less than 1%   07/22/2022 Cancer Staging   Staging form: Breast, AJCC 8th Edition - Clinical: Stage IA (cT1c, cN0, cM0, G2, ER+, PR+, HER2-) - Signed by [name], MD on 07/22/2022 Histologic grading system: 3 grade system     INTERVAL HISTORY:  Caitlin [name] to review her survivorship care plan detailing her treatment course for breast cancer, as well as monitoring long-term side effects of that treatment, education regarding health maintenance, screening, and overall wellness and health promotion.     Overall, Caitlin [name] reports feeling quite well.  She is receiving Zoladex injections given every 4 weeks and is taking letrozole daily.  She started the letrozole in September and so far is tolerating this moderately well with the main side effect being that she experiences slightly more fatigue.  She is exercising every morning by walking for 45 minutes and notes that she gets herself up in the mornings to do this because she is too tired in the afternoons.  Caitlin [name] has been experiencing left breast lymphedema and has been seeing physical therapy for this.  She has a binder that she is wearing and she notes that the swelling does fluctuate.  REVIEW OF SYSTEMS:  Review of Systems  Constitutional:  Positive for fatigue. Negative for appetite change, chills, fever and unexpected weight change.  HENT:   Negative for hearing loss, lump/mass and trouble swallowing.   Eyes:  Negative for eye  problems and icterus.  Respiratory:  Negative for chest tightness, cough and shortness of breath.   Cardiovascular:  Negative for chest pain, leg swelling and palpitations.  Gastrointestinal:  Negative for abdominal distention, abdominal pain, constipation, diarrhea, nausea and vomiting.  Endocrine: Negative for hot flashes.  Genitourinary:  Negative for difficulty urinating.   Musculoskeletal:  Positive for arthralgias.  Skin:  Negative for itching and rash.  Neurological:  Negative for dizziness, extremity weakness, headaches and numbness.  Hematological:  Negative for adenopathy. Does not bruise/bleed easily.  Psychiatric/Behavioral:  Negative for depression. The patient is not nervous/anxious.   Breast: Denies any new nodularity, masses, tenderness, nipple changes, or nipple discharge.      ONCOLOGY TREATMENT TEAM:  1. Surgeon:  Dr. [name] at [facility] 2. Medical Oncologist: Dr.  [name] 3. Radiation Oncologist: Dr. Isidore Moos    PAST MEDICAL/SURGICAL HISTORY:  Past Medical History:  Diagnosis Date   Anxiety    Back pain    Edema of both lower extremities    Elevated cholesterol    Endometriosis    Endometriosis    Family history of kidney cancer    Family history of ovarian cancer    Gallbladder problem    High cholesterol    History of IBS    IBS (irritable bowel syndrome)    Infertility, female    Vitamin D deficiency    Past Surgical History:  Procedure Laterality Date   BREAST LUMPECTOMY WITH RADIOACTIVE SEED AND SENTINEL LYMPH NODE BIOPSY Left 04/22/2022   Procedure: LEFT BREAST LUMPECTOMY WITH RADIOACTIVE SEED;  Surgeon: Coralie Keens, MD;  Location: [facility];  Service: General;  Laterality: Left;  LMA   CHOLECYSTECTOMY  2018   LAPAROSCOPY     SENTINEL NODE BIOPSY Left 04/22/2022   Procedure: LEFT SENTINEL LYMPH NODE BIOPSY;  Surgeon: Coralie Keens, MD;  Location: [facility];  Service: General;  Laterality: Left;     ALLERGIES:  Allergies   Allergen Reactions   Ciprofloxacin Other (See Comments)    "achy joints" "achy joints"    Sulfa Antibiotics Rash     CURRENT MEDICATIONS:  Outpatient Encounter Medications as of 10/22/2022  Medication Sig   furosemide (LASIX) 20 MG tablet TAKE ONE TABLET BY MOUTH DAILY   letrozole (FEMARA) 2.5 MG tablet Take 1 tablet (2.5 mg total) by mouth daily.   VITAMIN D, CHOLECALCIFEROL, PO Take 1 tablet by mouth daily.   No facility-administered encounter medications on file as of 10/22/2022.     ONCOLOGIC FAMILY HISTORY:  Family History  Problem Relation Age of Onset   Thyroid disease Mother    Depression Mother    Anxiety disorder Mother    Obesity Mother    Hypertension Father    Heart disease Father    Heart disease Maternal Aunt    Diabetes Maternal Aunt    Kidney cancer Maternal Aunt 49   Rheum arthritis Paternal Aunt    Heart disease Maternal Grandmother    COPD Maternal Grandmother    Heart disease Maternal Grandfather    Ovarian cancer Paternal Grandmother        dx in her 11s   Cancer Cousin        unknown cancer in mat first cousin     GENETIC COUNSELING/TESTING: Negative  SOCIAL HISTORY:  Social History  Socioeconomic History   Marital status: Married    Spouse name: Gerald Stabs   Number of children: 3   Years of education: Not on file   Highest education level: Not on file  Occupational History   Occupation: English as a second language teacher  Tobacco Use   Smoking status: Never   Smokeless tobacco: Never  Vaping Use   Vaping Use: Never used  Substance and Sexual Activity   Alcohol use: No   Drug use: No   Sexual activity: Yes    Birth control/protection: None  Other Topics Concern   Not on file  Social History Narrative   Right Handed   Two Story Home   Drinks Caffeine Occasionally    Social Determinants of Health   Financial Resource Strain: Low Risk  (05/01/2022)   Overall Financial Resource Strain (CARDIA)    Difficulty of Paying Living Expenses: Not hard at all   Food Insecurity: No Food Insecurity (05/01/2022)   Hunger Vital Sign    Worried About Running Out of Food in the Last Year: Never true    [place] in the Last Year: Never true  Transportation Needs: No Transportation Needs (05/01/2022)   PRAPARE - Hydrologist (Medical): No    Lack of Transportation (Non-Medical): No  Physical Activity: Not on file  Stress: Not on file  Social Connections: Not on file  Intimate Partner Violence: Not on file     OBSERVATIONS/OBJECTIVE:  BP 119/84 (BP Location: Left Arm, Patient Position: Sitting)   Pulse 81   Temp (!) 97.5 F (36.4 C) (Temporal)   Resp 18   Ht 5' 5" (1.651 m)   Wt 234 lb 14.4 oz (106.5 kg)   SpO2 99%   BMI 39.09 kg/m  GENERAL: Patient is a well appearing female in no acute distress HEENT:  Sclerae anicteric.  Oropharynx clear and moist. No ulcerations or evidence of oropharyngeal candidiasis. Neck is supple.  NODES:  No cervical, supraclavicular, or axillary lymphadenopathy palpated.  BREAST EXAM: Right breast is benign left breast status postlumpectomy and radiation breast lymphedema is present however no sign of infection or cellulitis is noted. LUNGS:  Clear to auscultation bilaterally.  No wheezes or rhonchi. HEART:  Regular rate and rhythm. No murmur appreciated. ABDOMEN:  Soft, nontender.  Positive, normoactive bowel sounds. No organomegaly palpated. MSK:  No focal spinal tenderness to palpation. Full range of motion bilaterally in the upper extremities. EXTREMITIES:  No peripheral edema.   SKIN:  Clear with no obvious rashes or skin changes. No nail dyscrasia. NEURO:  Nonfocal. Well oriented.  Appropriate affect.   LABORATORY DATA:  None for this visit.  DIAGNOSTIC IMAGING:  None for this visit.      ASSESSMENT AND PLAN:  Caitlin [name] is a pleasant 50 y.o. 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  She presents to the Survivorship Clinic for our initial meeting and routine follow-up post-completion of treatment for breast cancer.    1. Stage 1A left breast cancer:  Caitlin [name] is continuing to recover from definitive treatment for breast cancer. She will follow-up with her medical oncologist, Dr. [name] in with history and physical exam per surveillance protocol.  She will continue her anti-estrogen therapy with Zoladex and letrozole. Thus far, she is tolerating the combination moderately well, with minimal side effects. Her mammogram is due March  2024; orders placed today. Today, a comprehensive survivorship care plan and treatment summary was reviewed with the patient today detailing her breast cancer diagnosis, treatment course, potential late/long-term effects of treatment, appropriate follow-up care with recommendations for the future, and patient education resources.  A copy of this summary, along with a letter will be sent to the patient's primary care provider via mail/fax/In Basket message after today's visit.    2.  Breast lymphedema: I recommended that she continue to see physical therapy and reassured her that this does happen after radiation and should improve slowly.  3. Bone health:  Given Caitlin [name]'s age/history of breast cancer and her current treatment regimen including anti-estrogen therapy with letrozole, she is at risk for bone demineralization.  Her last DEXA scan was September 17, 2022 and showed a normal bone density test.  We recommended that she follow-up with repeat testing in 2 years since she is taking medication that can reduce her bone density.  She was given education on specific activities to promote bone health.  4. Cancer screening:  Due to Caitlin [name]'s history and her age, she should receive screening for skin cancers, colon cancer, and gynecologic cancers.   The information and recommendations are listed on the patient's comprehensive care plan/treatment summary and were reviewed in detail with the patient.    5. Health maintenance and wellness promotion: Caitlin [name] was encouraged to consume 5-7 servings of fruits and vegetables per day. We reviewed the "Nutrition Rainbow" handout.  She was also encouraged to engage in moderate to vigorous exercise for 30 minutes per day most days of the week. We discussed the [employer] fitness program, which is designed for cancer survivors to help them become more physically fit after cancer treatments.  She was instructed to limit her alcohol consumption and continue to abstain from tobacco use.     6. Support services/counseling: It is not uncommon for this period of the patient's cancer care trajectory to be one of many emotions and stressors. She was given information regarding our available services and encouraged to contact me with any questions or for help enrolling in any of our support group/programs.    Follow up instructions:    -Return to cancer center in January 2024 for follow-up with Dr. [name]; in July 2024 for follow-up with Mendel Ryder. -Mammogram due in March 2024 -Bone density testing in September 2025 -She is welcome to return back to the Survivorship Clinic at any time; no additional follow-up needed at this time.  -Consider referral back to survivorship as a long-term survivor for continued surveillance  The patient was provided an opportunity to ask questions and all were answered. The patient agreed with the plan and demonstrated an understanding of the instructions.   Total encounter time:40 minutes*in face-to-face visit time, chart review, lab review, care coordination, order entry, and documentation of the encounter time.    Wilber Bihari, NP 10/22/22 9:14 AM Medical Oncology and Hematology [facility] [place], [place] [address] Tel. [phone]     Fax. [phone]  *Total Encounter Time as defined by the Centers for Medicare and Medicaid Services includes, in addition to the face-to-face time of a patient visit (documented in the note above) non-face-to-face time: obtaining and reviewing outside history, ordering and reviewing medications, tests or procedures, care coordination (communications with other health care professionals or caregivers) and documentation in the medical record.

## 2022-10-22 ENCOUNTER — Inpatient Hospital Stay: Payer: BC Managed Care – PPO

## 2022-10-22 ENCOUNTER — Encounter: Payer: Self-pay | Admitting: Adult Health

## 2022-10-22 ENCOUNTER — Inpatient Hospital Stay: Payer: BC Managed Care – PPO | Attending: Hematology and Oncology | Admitting: Adult Health

## 2022-10-22 VITALS — BP 119/84 | HR 81 | Temp 97.5°F | Resp 18 | Ht 65.0 in | Wt 234.9 lb

## 2022-10-22 DIAGNOSIS — Z170 Estrogen receptor positive status [ER+]: Secondary | ICD-10-CM | POA: Diagnosis not present

## 2022-10-22 DIAGNOSIS — Z8041 Family history of malignant neoplasm of ovary: Secondary | ICD-10-CM | POA: Insufficient documentation

## 2022-10-22 DIAGNOSIS — Z79811 Long term (current) use of aromatase inhibitors: Secondary | ICD-10-CM | POA: Insufficient documentation

## 2022-10-22 DIAGNOSIS — Z8051 Family history of malignant neoplasm of kidney: Secondary | ICD-10-CM | POA: Insufficient documentation

## 2022-10-22 DIAGNOSIS — Z5111 Encounter for antineoplastic chemotherapy: Secondary | ICD-10-CM | POA: Diagnosis not present

## 2022-10-22 DIAGNOSIS — C50412 Malignant neoplasm of upper-outer quadrant of left female breast: Secondary | ICD-10-CM | POA: Diagnosis not present

## 2022-10-22 DIAGNOSIS — I890 Lymphedema, not elsewhere classified: Secondary | ICD-10-CM | POA: Diagnosis not present

## 2022-10-22 DIAGNOSIS — Z83719 Family history of colon polyps, unspecified: Secondary | ICD-10-CM | POA: Insufficient documentation

## 2022-10-22 MED ORDER — GOSERELIN ACETATE 3.6 MG ~~LOC~~ IMPL
3.6000 mg | DRUG_IMPLANT | Freq: Once | SUBCUTANEOUS | Status: AC
  Administered 2022-10-22: 3.6 mg via SUBCUTANEOUS
  Filled 2022-10-22: qty 3.6

## 2022-11-12 ENCOUNTER — Ambulatory Visit (INDEPENDENT_AMBULATORY_CARE_PROVIDER_SITE_OTHER): Payer: BC Managed Care – PPO | Admitting: Podiatry

## 2022-11-12 ENCOUNTER — Ambulatory Visit (INDEPENDENT_AMBULATORY_CARE_PROVIDER_SITE_OTHER): Payer: BC Managed Care – PPO

## 2022-11-12 DIAGNOSIS — M722 Plantar fascial fibromatosis: Secondary | ICD-10-CM | POA: Diagnosis not present

## 2022-11-12 NOTE — Progress Notes (Signed)
Subjective:  Patient ID: Caitlin [name], female    DOB: 1971-10-26,  MRN: [id number]  No chief complaint on file.   51 y.o. female presents with the above complaint.  Patient presents with left heel pain has been going for quite some time is progressive gotten worse worse with ambulation hurts with pressure.  She would like for me to address that has been going on for a little bit of time but she had breast cancer that she was healing from.  She has been walking more she will as well.  She wanted get it evaluated pain scale 7 out of 10 hurts with ambulation worse with pressure   Review of Systems: Negative except as noted in the HPI. Denies N/V/F/Ch.  Past Medical History:  Diagnosis Date   Anxiety    Back pain    Edema of both lower extremities    Elevated cholesterol    Endometriosis    Endometriosis    Family history of kidney cancer    Family history of ovarian cancer    Gallbladder problem    High cholesterol    History of IBS    IBS (irritable bowel syndrome)    Infertility, female    Vitamin D deficiency     Current Outpatient Medications:    Cholecalciferol 100 MCG (4000 UT) CAPS, 4000 units every 24 hours by oral route., Disp: , Rfl:    famotidine (PEPCID) 40 MG tablet, Take 40 mg by mouth at bedtime., Disp: , Rfl:    furosemide (LASIX) 20 MG tablet, TAKE ONE TABLET BY MOUTH DAILY, Disp: 30 tablet, Rfl: 6   letrozole (FEMARA) 2.5 MG tablet, Take 1 tablet (2.5 mg total) by mouth daily., Disp: 90 tablet, Rfl: 3   VITAMIN D, CHOLECALCIFEROL, PO, Take 1 tablet by mouth daily., Disp: , Rfl:   Social History   Tobacco Use  Smoking Status Never  Smokeless Tobacco Never    Allergies  Allergen Reactions   Ciprofloxacin Other (See Comments)    "achy joints" "achy joints"    Sulfa Antibiotics Rash   Objective:  There were no vitals filed for this visit. There is no height or weight on file to calculate BMI. Constitutional Well developed. Well nourished.   Vascular Dorsalis pedis pulses palpable bilaterally. Posterior tibial pulses palpable bilaterally. Capillary refill normal to all digits.  No cyanosis or clubbing noted. Pedal hair growth normal.  Neurologic Normal speech. Oriented to person, place, and time. Epicritic sensation to light touch grossly present bilaterally.  Dermatologic Nails well groomed and normal in appearance. No open wounds. No skin lesions.  Orthopedic: Normal joint ROM without pain or crepitus bilaterally. No visible deformities. Tender to palpation at the calcaneal tuber left. No pain with calcaneal squeeze left. Ankle ROM diminished range of motion left. Silfverskiold Test: positive left.   Radiographs: Taken and reviewed. No acute fractures or dislocations. No evidence of stress fracture.  Plantar heel spur present. Posterior heel spur present.   Assessment:   1. Plantar fasciitis of left foot    Plan:  Patient was evaluated and treated and all questions answered.  Plantar Fasciitis, left - XR reviewed as above.  - Educated on icing and stretching. Instructions given.  - Injection delivered to the plantar fascia as below. - DME: Plantar fascial brace dispensed to support the medial longitudinal arch of the foot and offload pressure from the heel and prevent arch collapse during weightbearing - Pharmacologic management: None  Procedure: Injection Tendon/Ligament Location: Left plantar fascia  at the glabrous junction; medial approach. Skin Prep: alcohol Injectate: 0.5 cc 0.5% marcaine plain, 0.5 cc of 1% Lidocaine, 0.5 cc kenalog 10. Disposition: Patient tolerated procedure well. Injection site dressed with a band-aid.  No follow-ups on file.

## 2022-11-17 ENCOUNTER — Inpatient Hospital Stay: Payer: BC Managed Care – PPO | Attending: Hematology and Oncology

## 2022-11-17 ENCOUNTER — Telehealth: Payer: Self-pay | Admitting: Adult Health

## 2022-11-17 ENCOUNTER — Other Ambulatory Visit: Payer: Self-pay

## 2022-11-17 VITALS — BP 124/90 | HR 84 | Temp 98.4°F | Resp 18

## 2022-11-17 DIAGNOSIS — C50412 Malignant neoplasm of upper-outer quadrant of left female breast: Secondary | ICD-10-CM | POA: Insufficient documentation

## 2022-11-17 DIAGNOSIS — Z170 Estrogen receptor positive status [ER+]: Secondary | ICD-10-CM | POA: Diagnosis not present

## 2022-11-17 DIAGNOSIS — Z5111 Encounter for antineoplastic chemotherapy: Secondary | ICD-10-CM | POA: Diagnosis not present

## 2022-11-17 MED ORDER — GOSERELIN ACETATE 3.6 MG ~~LOC~~ IMPL
3.6000 mg | DRUG_IMPLANT | Freq: Once | SUBCUTANEOUS | Status: AC
  Administered 2022-11-17: 3.6 mg via SUBCUTANEOUS
  Filled 2022-11-17: qty 3.6

## 2022-11-17 NOTE — Telephone Encounter (Signed)
Scheduled appointment per 10/25 los. Patient is aware. 

## 2022-11-19 DIAGNOSIS — I890 Lymphedema, not elsewhere classified: Secondary | ICD-10-CM | POA: Diagnosis not present

## 2022-12-05 ENCOUNTER — Telehealth: Payer: Self-pay

## 2022-12-11 DIAGNOSIS — D225 Melanocytic nevi of trunk: Secondary | ICD-10-CM | POA: Diagnosis not present

## 2022-12-11 DIAGNOSIS — L821 Other seborrheic keratosis: Secondary | ICD-10-CM | POA: Diagnosis not present

## 2022-12-11 DIAGNOSIS — D2262 Melanocytic nevi of left upper limb, including shoulder: Secondary | ICD-10-CM | POA: Diagnosis not present

## 2022-12-11 DIAGNOSIS — L813 Cafe au lait spots: Secondary | ICD-10-CM | POA: Diagnosis not present

## 2022-12-12 DIAGNOSIS — C50912 Malignant neoplasm of unspecified site of left female breast: Secondary | ICD-10-CM | POA: Diagnosis not present

## 2022-12-15 ENCOUNTER — Inpatient Hospital Stay: Payer: BC Managed Care – PPO | Attending: Hematology and Oncology

## 2022-12-15 ENCOUNTER — Other Ambulatory Visit: Payer: Self-pay | Admitting: Pharmacist

## 2022-12-15 VITALS — BP 127/88 | HR 73 | Temp 99.2°F | Resp 16

## 2022-12-15 DIAGNOSIS — Z5111 Encounter for antineoplastic chemotherapy: Secondary | ICD-10-CM | POA: Insufficient documentation

## 2022-12-15 DIAGNOSIS — Z170 Estrogen receptor positive status [ER+]: Secondary | ICD-10-CM | POA: Insufficient documentation

## 2022-12-15 DIAGNOSIS — C50412 Malignant neoplasm of upper-outer quadrant of left female breast: Secondary | ICD-10-CM | POA: Diagnosis not present

## 2022-12-15 MED ORDER — GOSERELIN ACETATE 3.6 MG ~~LOC~~ IMPL
3.6000 mg | DRUG_IMPLANT | Freq: Once | SUBCUTANEOUS | Status: AC
  Administered 2022-12-15: 3.6 mg via SUBCUTANEOUS
  Filled 2022-12-15: qty 3.6

## 2022-12-17 ENCOUNTER — Ambulatory Visit: Payer: BC Managed Care – PPO | Admitting: Podiatry

## 2022-12-17 ENCOUNTER — Ambulatory Visit (INDEPENDENT_AMBULATORY_CARE_PROVIDER_SITE_OTHER): Payer: BC Managed Care – PPO | Admitting: Podiatry

## 2022-12-17 VITALS — BP 136/68

## 2022-12-17 DIAGNOSIS — M722 Plantar fascial fibromatosis: Secondary | ICD-10-CM | POA: Diagnosis not present

## 2022-12-17 DIAGNOSIS — Q666 Other congenital valgus deformities of feet: Secondary | ICD-10-CM | POA: Diagnosis not present

## 2022-12-17 NOTE — Progress Notes (Signed)
Subjective:  Patient ID: Caitlin [name], female    DOB: [birth year],  MRN: [id number]  Chief Complaint  Patient presents with   Plantar Fasciitis    Left foot heel pain  Pt stated that she is doing well the brace and injection did help     51 y.o. female presents with the above complaint.  Patient presents with complaint of left Planter fasciitis.  She states the injection did help.  She still has some residual pain denies any other acute complaints   Review of Systems: Negative except as noted in the HPI. Denies N/V/F/Ch.  Past Medical History:  Diagnosis Date   Anxiety    Back pain    Edema of both lower extremities    Elevated cholesterol    Endometriosis    Endometriosis    Family history of kidney cancer    Family history of ovarian cancer    Gallbladder problem    High cholesterol    History of IBS    IBS (irritable bowel syndrome)    Infertility, female    Vitamin D deficiency     Current Outpatient Medications:    Cholecalciferol 100 MCG (4000 UT) CAPS, 4000 units every 24 hours by oral route., Disp: , Rfl:    famotidine (PEPCID) 40 MG tablet, Take 40 mg by mouth at bedtime., Disp: , Rfl:    furosemide (LASIX) 20 MG tablet, TAKE ONE TABLET BY MOUTH DAILY, Disp: 30 tablet, Rfl: 6   letrozole (FEMARA) 2.5 MG tablet, Take 1 tablet (2.5 mg total) by mouth daily., Disp: 90 tablet, Rfl: 3   VITAMIN D, CHOLECALCIFEROL, PO, Take 1 tablet by mouth daily., Disp: , Rfl:   Social History   Tobacco Use  Smoking Status Never  Smokeless Tobacco Never    Allergies  Allergen Reactions   Ciprofloxacin Other (See Comments)    "achy joints" "achy joints"    Sulfa Antibiotics Rash   Objective:   Vitals:   12/17/22 0821  BP: 136/68   There is no height or weight on file to calculate BMI. Constitutional Well developed. Well nourished.  Vascular Dorsalis pedis pulses palpable bilaterally. Posterior tibial pulses palpable bilaterally. Capillary refill normal to all  digits.  No cyanosis or clubbing noted. Pedal hair growth normal.  Neurologic Normal speech. Oriented to person, place, and time. Epicritic sensation to light touch grossly present bilaterally.  Dermatologic Nails well groomed and normal in appearance. No open wounds. No skin lesions.  Orthopedic: Normal joint ROM without pain or crepitus bilaterally. No visible deformities. Tender to palpation at the calcaneal tuber left. No pain with calcaneal squeeze left. Ankle ROM diminished range of motion left. Silfverskiold Test: positive left.   Radiographs: Taken and reviewed. No acute fractures or dislocations. No evidence of stress fracture.  Plantar heel spur present. Posterior heel spur present.   Assessment:   No diagnosis found.  Plan:  Patient was evaluated and treated and all questions answered.  Plantar Fasciitis, left - XR reviewed as above.  - Educated on icing and stretching. Instructions given.  -Second injection delivered to the plantar fascia as below. - DME: Night splint was dispensed - Pharmacologic management: None  Pes planovalgus -I explained to patient the etiology of pes planovalgus and relationship with Planter fasciitis and various treatment options were discussed.  Given patient foot structure in the setting of Planter fasciitis I believe patient will benefit from custom-made orthotics to help control the hindfoot motion support the arch of the foot and take  the stress away from plantar fascial.  Patient agrees with the plan like to proceed with orthotics -Patient was casted for orthotics   Procedure: Injection Tendon/Ligament Location: Left plantar fascia at the glabrous junction; medial approach. Skin Prep: alcohol Injectate: 0.5 cc 0.5% marcaine plain, 0.5 cc of 1% Lidocaine, 0.5 cc kenalog 10. Disposition: Patient tolerated procedure well. Injection site dressed with a band-aid.  No follow-ups on file.

## 2022-12-23 ENCOUNTER — Encounter: Payer: Self-pay | Admitting: Podiatry

## 2023-01-12 ENCOUNTER — Ambulatory Visit: Payer: BC Managed Care – PPO | Attending: Surgery | Admitting: Rehabilitation

## 2023-01-12 ENCOUNTER — Encounter: Payer: Self-pay | Admitting: Rehabilitation

## 2023-01-12 DIAGNOSIS — Z483 Aftercare following surgery for neoplasm: Secondary | ICD-10-CM

## 2023-01-12 DIAGNOSIS — C50412 Malignant neoplasm of upper-outer quadrant of left female breast: Secondary | ICD-10-CM | POA: Diagnosis not present

## 2023-01-12 DIAGNOSIS — R293 Abnormal posture: Secondary | ICD-10-CM | POA: Diagnosis not present

## 2023-01-12 DIAGNOSIS — Z170 Estrogen receptor positive status [ER+]: Secondary | ICD-10-CM

## 2023-01-12 DIAGNOSIS — I890 Lymphedema, not elsewhere classified: Secondary | ICD-10-CM

## 2023-01-12 NOTE — Therapy (Signed)
OUTPATIENT PHYSICAL THERAPY RE-EVAL   Patient Name: Caitlin [name] MRN: [id number] DOB:[birth year], 52 y.o., female Today's Date: 01/12/2023   PT End of Session - 01/12/23 1047     Visit Number 18    Date for PT Re-Evaluation 01/12/23    PT Start Time 1002    PT Stop Time 1039    PT Time Calculation (min) 37 min    Activity Tolerance Patient tolerated treatment well    Behavior During Therapy WFL for tasks assessed/performed                   Past Medical History:  Diagnosis Date   Anxiety    Back pain    Edema of both lower extremities    Elevated cholesterol    Endometriosis    Endometriosis    Family history of kidney cancer    Family history of ovarian cancer    Gallbladder problem    High cholesterol    History of IBS    IBS (irritable bowel syndrome)    Infertility, female    Vitamin D deficiency    Past Surgical History:  Procedure Laterality Date   BREAST LUMPECTOMY WITH RADIOACTIVE SEED AND SENTINEL LYMPH NODE BIOPSY Left 04/22/2022   Procedure: LEFT BREAST LUMPECTOMY WITH RADIOACTIVE SEED;  Surgeon: Coralie Keens, MD;  Location: [facility];  Service: General;  Laterality: Left;  LMA   CHOLECYSTECTOMY  2018   LAPAROSCOPY     SENTINEL NODE BIOPSY Left 04/22/2022   Procedure: LEFT SENTINEL LYMPH NODE BIOPSY;  Surgeon: Coralie Keens, MD;  Location: [facility];  Service: General;  Laterality: Left;   Patient Active Problem List   Diagnosis Date Noted   Family history of colonic polyps 10/22/2022   Genetic testing 04/29/2022   Family history of ovarian cancer 04/15/2022   Family history of kidney cancer 04/15/2022   Malignant neoplasm of upper-outer quadrant of left breast in female, estrogen receptor positive ([place]) 04/02/2022   Vitamin D insufficiency 02/18/2021   Elevated cholesterol 02/18/2021   Class 1 obesity due to excess calories with body mass index (BMI) of 34.0 to 34.9 in adult 02/07/2021   Irritable bowel syndrome 02/07/2021    PCP:  Horald Pollen, MD  REFERRING PROVIDER: Coralie Keens, MD  REFERRING DIAG: Lt breast cancer  THERAPY DIAG:  Aftercare following surgery for neoplasm  Malignant neoplasm of upper-outer quadrant of left breast in female, estrogen receptor positive ([place])  Abnormal posture  Lymphedema, not elsewhere classified  ONSET DATE: 03/29/22  SUBJECTIVE:  SUBJECTIVE STATEMENT:   I feel like the pump is working.  It seems like it is hitting the correct places.  I feel more swollen on the lateral breast with some pain.  It is not as bad where it was hurting.  The axilla is feeling fine.  It then skips over to the back of the arm. It is getting more and more constant.  It feels more different, not really painful.  Can be worse in the morning if I sleep on that side.    PERTINENT HISTORY:  Patient was diagnosed with left IDC. ER/PR positive, HER2 negative KI67 less than 5%. Pt had left lumpectomy and SLNB on 04/22/22 with Dr. [name]. Radiation. No chemotherapy.  Radiation completed 07/09/22. Starting tamoxifen.   PATIENT GOALS:  Reassess how my recovery is going related to arm function, pain, and swelling.  PAIN:  Are you having pain? 3-4/10 at the most - more just uncomfortable  PRECAUTIONS: Recent Surgery, left UE Lymphedema risk,   ACTIVITY LEVEL / LEISURE: I am back to normal activity    OBJECTIVE:  OBSERVATIONS: Left breast is larger than the right, nipple incision well healed, axillary incision is very red with a rolled inferior edge and some yellow sloughy tissue above this.  PA is okay with healing status.  - incision does not pull open with PROM 09/30/22: breast is less red, still overall swollen, more mildly enlarged pores 01/12/23: breast is less red, swollen overall, still with enlarged pores medial  breast  PALPATION: firmness possible seroma vs scar tissue medial/inferior to incision not as palpable in the supine position 09/30/22: less fibrosis, more overall general edema, seroma/scar tissue softening 01/12/23: less firmness and fibrosis medially, no pitting  LYMPHEDEMA ASSESSMENT:   Edema questionnaire: 51/80 on 07/24/22, 46/80 on 09/30/22  UPPER EXTREMITY AROM/PROM:                          (Blank rows = not tested)   A/PROM LEFT  04/03/2022 05/13/22 08/18/22 09/15/22 01/12/23  Shoulder extension 68      Shoulder flexion 160 163 163 165 165  Shoulder abduction 165 165 165 165 165  Shoulder internal rotation        Shoulder external rotation 100 95 95 - breast pull 95                           (Blank rows = not tested)   LYMPHEDEMA ASSESSMENTS:    LANDMARK RIGHT  04/03/2022  10 cm proximal to olecranon process    Olecranon process    10 cm proximal to ulnar styloid process    Just proximal to ulnar styloid process    Across hand at thumb web space    At base of 2nd digit    (Blank rows = not tested)   [facility] LEFT  04/03/2022 05/13/22  10 cm proximal to olecranon process 37 37  Olecranon process 29.'5 30  10 '$ cm proximal to ulnar styloid process 24 25  Just proximal to ulnar styloid process 17.1 17.3  Across hand at thumb web space 19.6 19.4  At base of 2nd digit 6.5 6.6  (Blank rows = not tested)  L-DEX MEASUREMENT EXTREMITY: Upper Extremity L-DEX LYMPHEDEMA SCREENING Measurement Type: Unilateral L-DEX MEASUREMENT EXTREMITY: Upper Extremity POSITION : Standing DOMINANT SIDE: Right At Risk Side: Left BASELINE SCORE (UNILATERAL): -2.6 L-DEX SCORE (UNILATERAL): -0.6 VALUE CHANGE (UNILAT): 2     PATIENT  EDUCATION:  Education details: POC, briefly self MLD for the Left breast omitting inguinal steps for today Person educated: Patient Education method: Explanation, Demonstration, Tactile cues, Verbal cues, and Handouts Education comprehension: verbalized understanding,  returned demonstration, verbal cues required, tactile cues required, and needs further education  HOME EXERCISE PROGRAM:  Reviewed previously given post op HEP.  Self MLD Lt breast without inguinal steps for now  Lat stretches added 01/12/23  TODAY'S TREATMENT  Pt permission and consent throughout each step of examination and treatment with modification and draping if requested when working on sensitive areas  01/12/22 Redid SOZO which was still WNL and not elevated Checked breast, axilla, and upper arm.  +1-2 ttp latissimus near insertion STM latissimus insertion and belly in supine and sidelying Education on lat stretches and self release with a ball per below  09/30/22 MLD: In Supine: short neck, superficial and deep abdominals, bil axillary nodes and Lt inguinal nodes, anterior inter-axillary and Lt axillo-inguinal anastomosis, then focused on Lt breast, then into Rt S/L for further work to lateral breast, then finished retracing steps in supine  STM/release of Lt axilla in arm overhead position with cocoa butter Review of how to care for pump  09/23/22 Pulleys flexion and abduction x 50mn each Wall abduction with ball 10" x 5 left only MLD: In Supine: short neck, superficial and deep abdominals, bil axillary nodes and Lt inguinal nodes, anterior inter-axillary and Lt axillo-inguinal anastomosis, then focused on Lt breast, then into Rt S/L for further work to lateral breast, then finished retracing steps in supine  STM/release of Lt axilla in arm overhead position with cocoa butter  ASSESSMENT: CLINICAL IMPRESSION:  Pt returns with concerns about UE lymphedema and feeling odd in the upper arm lately.  SOZO was still WNL without any increase.  Breast edema seems consistent for patient and she is ind with pump use and compression.  Noted TrP in latissimus with reproduction of pain here.  Educated pt on treatment options and she will attempt stretching and return if needed.  Relieved that  no UE lymphedema was detected.   Pt will benefit from skilled therapeutic intervention to improve on the following deficits: Decreased knowledge of precautions, impaired UE functional use, pain, decreased ROM, postural dysfunction.   PT treatment/interventions: ADL/Self care home management, Therapeutic exercises, Patient/Family education, Manual lymph drainage, Compression bandaging, Taping, Manual therapy, and Re-evaluation  GOALS: Goals reviewed with patient? Yes  LONG TERM GOALS:  (STG=LTG)  GOALS Name Target Date (Remove Blue Hyperlink) Goal status  1 Pt will demonstrate she has regained full shoulder ROM and function post operatively compared to baselines.  Baseline: 05/13/22 MET  2 Pt will be ind with self MLD for the left breast 07/02/22 MET  3 Pt will decrease heaviness of the breast by at least 50% 09/29/22 MET  4 Pt will decrease breast edema questionnaire to at least 25/80 09/29/22 NOT MET  5 Pt will be ind with latissimus stretches for home 01/12/23 MET     PLAN: PT FREQUENCY/DURATION: 1 visit  PLAN FOR NEXT SESSION:  SOZO  PHYSICAL THERAPY DISCHARGE SUMMARY - UPDATED  Visits from Start of Care: 18  Current functional level related to goals / functional outcomes: See above   Remaining deficits: Chronic breast lymphedema   Education / Equipment: See above  Plan: Patient agrees to discharge. Patient is being discharged due to meeting the stated rehab goals.          KShan Levans PT  01/12/23 10:48 AM  Manual Lymph Drainage for Left Breast.   Do daily.  Do slowly. Use flat hands with just enough pressure to stretch the skin. Do not slide over the skin   (Stretch  Relax  Move) Lie down or sit comfortably (in a recliner, for example) to do this.   Do circles at each collar bone near neck 5-7 times (to "wake up" lots of lymph nodes in this area).  Take slow deep breaths, allowing your belly to balloon out as you breathe in, 5x (to "wake up"  abdominal lymph nodes).  Both armpits--stretch skin in small circles to stimulate intact lymph nodes there, 5-7x.  Redirect fluid from left chest toward right armpit (stretch skin starting at left chest in 3-4 spots working toward right armpit) 3-4x across the chest.  Draw an imaginary diagonal line from upper outer breast through the nipple area toward lower inner breast.  Direct fluid upward and inward from this line toward the pathway across your upper chest (established in #5).  Do this in three rows to treat all the upper inner breast and do each row 3-4x.  Then direct the fluid down and out from this line toward the pathway down your side going towards the left groin. Do this in three rows and do each row 3-4x. (established in #6)   Then repeat your pathways (#5 and #6)  End with repeating #3 and #4 above. (circles in both armpits and the left groin)

## 2023-01-13 ENCOUNTER — Inpatient Hospital Stay: Payer: BC Managed Care – PPO

## 2023-01-13 ENCOUNTER — Inpatient Hospital Stay: Payer: BC Managed Care – PPO | Attending: Hematology and Oncology | Admitting: Hematology and Oncology

## 2023-01-13 ENCOUNTER — Encounter: Payer: Self-pay | Admitting: Hematology and Oncology

## 2023-01-13 VITALS — BP 131/86 | HR 84 | Temp 97.9°F | Resp 16 | Ht 65.0 in | Wt 239.1 lb

## 2023-01-13 DIAGNOSIS — Z8041 Family history of malignant neoplasm of ovary: Secondary | ICD-10-CM | POA: Insufficient documentation

## 2023-01-13 DIAGNOSIS — Z5111 Encounter for antineoplastic chemotherapy: Secondary | ICD-10-CM | POA: Insufficient documentation

## 2023-01-13 DIAGNOSIS — I890 Lymphedema, not elsewhere classified: Secondary | ICD-10-CM | POA: Diagnosis not present

## 2023-01-13 DIAGNOSIS — E7800 Pure hypercholesterolemia, unspecified: Secondary | ICD-10-CM | POA: Insufficient documentation

## 2023-01-13 DIAGNOSIS — C50412 Malignant neoplasm of upper-outer quadrant of left female breast: Secondary | ICD-10-CM | POA: Insufficient documentation

## 2023-01-13 DIAGNOSIS — Z79811 Long term (current) use of aromatase inhibitors: Secondary | ICD-10-CM | POA: Insufficient documentation

## 2023-01-13 DIAGNOSIS — Z170 Estrogen receptor positive status [ER+]: Secondary | ICD-10-CM | POA: Diagnosis not present

## 2023-01-13 DIAGNOSIS — R232 Flushing: Secondary | ICD-10-CM | POA: Insufficient documentation

## 2023-01-13 MED ORDER — GOSERELIN ACETATE 3.6 MG ~~LOC~~ IMPL
3.6000 mg | DRUG_IMPLANT | Freq: Once | SUBCUTANEOUS | Status: AC
  Administered 2023-01-13: 3.6 mg via SUBCUTANEOUS
  Filled 2023-01-13: qty 3.6

## 2023-01-13 NOTE — Assessment & Plan Note (Addendum)
This is a very pleasant 52 year old premenopausal female patient with newly diagnosed left breast invasive ductal carcinoma, ER/PR positive, HER2 negative referred to medical oncology for recommendations. She underwent lumpectomy followed by Oncotype DX testing.  Final pathology showed a 1.2 cm tumor, grade 2, margins uninvolved, 3 out of 3 sentinel lymph nodes negative.  Prognostics on prior biopsy showed ER 95% positive, PR 100% staining, HER2 negative and KI of 5% Oncotype DX showed a recurrence score of 16, distant recurrence risk at 9 years of 4% with antiestrogen therapy.  There appears to be no significant chemotherapy benefit in this group She completed adjuvant radiation.  She is now on adjuvant Zoladex with letrozole.  We talked about tamoxifen however she has some underlying endometrial issues hence her gynecologist recommended trying not on tamoxifen options.  She is tolerating this combination remarkably well.  Mammogram scheduled for March 2024.  No breast changes today except for some lymphedema. She will continue this combination and return to clinic to see me in summer.  From thereon will try to see her every summer and she can follow-up with breast surgery every winter.  She was of course encouraged to contact me sooner with any new questions or concerns.  Bone density September 17, 2022, normal.  I encouraged her walking habit every day.  She can also consider taking some vitamin D supplements and consume calcium rich diet.

## 2023-01-13 NOTE — Progress Notes (Signed)
[facility] NOTE  Patient Care Team: [name], MD as PCP - General (Family Medicine) [name], MD as Consulting Physician (Hematology and Oncology) Eppie Gibson, MD as Attending Physician (Radiation Oncology) Coralie Keens, MD as Consulting Physician (General Surgery)  CHIEF COMPLAINTS/PURPOSE OF CONSULTATION:  Breast cancer follow up.  HISTORY OF PRESENTING ILLNESS:  Caitlin [name] 52 y.o. female is here because of recent diagnosis of left breast cancer.  I reviewed her records extensively and collaborated the history with the patient.  SUMMARY OF ONCOLOGIC HISTORY: Oncology History  Malignant neoplasm of upper-outer quadrant of left breast in female, estrogen receptor positive ([place])  03/10/2022 Mammogram   Mammogram of the left breast showed irregular hypoechoic mass in the left breast.  Targeted ultrasound performed also confirmed an irregular hypoechoic mass in the left breast at 2:00 2 cm from the nipple measuring 1.1 x 0.8 x 0.5 cm.  Left axilla did not show any enlarged lymphadenopathy.   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  Prognostic showed ER 95% positive moderate staining.  PR 100% positive strong staining.  HER2 equivocal by IHC.  FISH negative    04/02/2022 Initial Diagnosis   Malignant neoplasm of upper-outer quadrant of left breast in female, estrogen receptor positive ([place])   04/22/2022 Surgery   She had left breast lumpectomy on April 25 and pathology showed invasive ductal carcinoma Nottingham grade 2 out of 3, 1.0 cm, DCIS, intermediate grade, margins uninvolved by carcinoma at 3 out of 3 sentinel lymph nodes without any evidence of micrometastasis or micrometastasis.  Prognostics on prior biopsy showed ER +95% ER +100% staining HER2 negative, KI of less than 5%   04/25/2022 Genetic  Testing   Negative genetic testing on the CancerNext-Expanded+RNainsight panel.  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 The report date is April 25, 2022.  The CancerNext-Expanded gene panel offered by [facility] and includes sequencing and rearrangement analysis for the following 77 genes: AIP, ALK, APC*, ATM*, AXIN2, BAP1, BARD1, BLM, BMPR1A, BRCA1*, BRCA2*, BRIP1*, CDC73, CDH1*, CDK4, CDKN1B, CDKN2A, CHEK2*, CTNNA1, DICER1, FANCC, FH, FLCN, GALNT12, KIF1B, LZTR1, MAX, MEN1, MET, MLH1*, MSH2*, MSH3, MSH6*, MUTYH*, NBN, NF1*, NF2, NTHL1, PALB2*, PHOX2B, PMS2*, POT1, PRKAR1A, PTCH1, PTEN*, RAD51C*, RAD51D*, RB1, RECQL, RET, SDHA, SDHAF2, SDHB, SDHC, SDHD, SMAD4, SMARCA4, SMARCB1, SMARCE1, STK11, SUFU, TMEM127, TP53*, TSC1, TSC2, VHL and XRCC2 (sequencing and deletion/duplication); EGFR, EGLN1, HOXB13, KIT, MITF, PDGFRA, POLD1, and POLE (sequencing only); EPCAM and GREM1 (deletion/duplication only). DNA and RNA analyses performed for * genes.    05/07/2022 Oncotype testing   Oncotype recurrence score of 16, distant recurrence risk at 9 years is 4%, group average absolute chemotherapy benefit less than 1%   06/11/2022 - 07/09/2022 Radiation Therapy   Site Technique Total Dose (Gy) Dose per Fx (Gy) Completed Fx Beam Energies  Breast, Left: Breast_L 3D 40.05/40.05 2.67 15/15 10X  Breast, Left: Breast_L_Bst 3D 10/10 2 5/5 6X     07/22/2022 Cancer Staging   Staging form: Breast, AJCC 8th Edition - Clinical: Stage IA (cT1c, cN0, cM0, G2, ER+, PR+, HER2-) - Signed by [name], MD on 07/22/2022 Histologic grading system: 3 grade system   09/22/2022 -  Anti-estrogen oral therapy   Zoladex started on 08/25/2022 given every 4 weeks, Letrozole began 09/22/2022    Interval history  Caitlin [name] is here for a follow up. Since last visit she has been on  letrozole and Zoladex.  Her gynecologist recommended aromatase inhibitors given her endometrial issues.  She has been tolerating the combination remarkably well.   She has some mild hot flashes and vaginal dryness. No breast changes reported.  She is working with lymphedema clinic, lymphedema continues to improve in the left breast. Rest of the pertinent 10 point ROS reviewed and negative  MEDICAL HISTORY:  Past Medical History:  Diagnosis Date   Anxiety    Back pain    Edema of both lower extremities    Elevated cholesterol    Endometriosis    Endometriosis    Family history of kidney cancer    Family history of ovarian cancer    Gallbladder problem    High cholesterol    History of IBS    IBS (irritable bowel syndrome)    Infertility, female    Vitamin D deficiency     SURGICAL HISTORY: Past Surgical History:  Procedure Laterality Date   BREAST LUMPECTOMY WITH RADIOACTIVE SEED AND SENTINEL LYMPH NODE BIOPSY Left 04/22/2022   Procedure: LEFT BREAST LUMPECTOMY WITH RADIOACTIVE SEED;  Surgeon: Coralie Keens, MD;  Location: [facility];  Service: General;  Laterality: Left;  LMA   CHOLECYSTECTOMY  2018   LAPAROSCOPY     SENTINEL NODE BIOPSY Left 04/22/2022   Procedure: LEFT SENTINEL LYMPH NODE BIOPSY;  Surgeon: Coralie Keens, MD;  Location: [facility];  Service: General;  Laterality: Left;    SOCIAL HISTORY: Social History   Socioeconomic History   Marital status: Married    Spouse name: Gerald Stabs   Number of children: 3   Years of education: Not on file   Highest education level: Not on file  Occupational History   Occupation: English as a second language teacher  Tobacco Use   Smoking status: Never   Smokeless tobacco: Never  Vaping Use   Vaping Use: Never used  Substance and Sexual Activity   Alcohol use: No   Drug use: No   Sexual activity: Yes    Birth control/protection: None  Other Topics Concern   Not on file  Social History Narrative   Right Handed   Two Story Home   Drinks Caffeine Occasionally    Social Determinants of Health   Financial Resource Strain: Low Risk  (05/01/2022)   Overall Financial Resource Strain (CARDIA)    Difficulty of  Paying Living Expenses: Not hard at all  Food Insecurity: No Food Insecurity (05/01/2022)   Hunger Vital Sign    Worried About Running Out of Food in the Last Year: Never true    [place] in the Last Year: Never true  Transportation Needs: No Transportation Needs (05/01/2022)   PRAPARE - Hydrologist (Medical): No    Lack of Transportation (Non-Medical): No  Physical Activity: Not on file  Stress: Not on file  Social Connections: Not on file  Intimate Partner Violence: Not on file    FAMILY HISTORY: Family History  Problem Relation Age of Onset   Thyroid disease Mother    Depression Mother    Anxiety disorder Mother    Obesity Mother    Hypertension Father    Heart disease Father    Heart disease Maternal Aunt    Diabetes Maternal Aunt    Kidney cancer Maternal Aunt 72   Rheum arthritis Paternal Aunt    Heart disease Maternal Grandmother    COPD Maternal Grandmother    Heart disease Maternal Grandfather    Ovarian cancer Paternal Grandmother  dx in her 70s   Cancer Cousin        unknown cancer in mat first cousin    ALLERGIES:  is allergic to ciprofloxacin and sulfa antibiotics.  MEDICATIONS:  Current Outpatient Medications  Medication Sig Dispense Refill   Cholecalciferol 100 MCG (4000 UT) CAPS 4000 units every 24 hours by oral route.     famotidine (PEPCID) 40 MG tablet Take 40 mg by mouth at bedtime.     furosemide (LASIX) 20 MG tablet TAKE ONE TABLET BY MOUTH DAILY 30 tablet 6   letrozole (FEMARA) 2.5 MG tablet Take 1 tablet (2.5 mg total) by mouth daily. 90 tablet 3   VITAMIN D, CHOLECALCIFEROL, PO Take 1 tablet by mouth daily.     No current facility-administered medications for this visit.    PHYSICAL EXAMINATION: ECOG PERFORMANCE STATUS: 0 - Asymptomatic  Vitals:   01/13/23 0925  BP: 131/86  Pulse: 84  Resp: 16  Temp: 97.9 F (36.6 C)  SpO2: 96%    Constitutional: Denies fevers, chills or abnormal night  sweats Neck: No regional adenopathy Bilateral breasts inspected.  Right breast normal to inspection and palpation.  Left breast with some lymphedema noted.  No palpable regional adenopathy or other masses.  No lower extremity swelling  Filed Weights   01/13/23 0925  Weight: 239 lb 1.6 oz (108.5 kg)       LABORATORY DATA:  I have reviewed the data as listed Lab Results  Component Value Date   WBC 8.7 04/15/2022   HGB 14.0 04/15/2022   HCT 42.2 04/15/2022   MCV 90.6 04/15/2022   PLT 282 04/15/2022   Lab Results  Component Value Date   NA 138 04/15/2022   K 3.6 04/15/2022   CL 104 04/15/2022   CO2 27 04/15/2022    RADIOGRAPHIC STUDIES: I have personally reviewed the radiological reports and agreed with the findings in the report.  I connected with  Jeanette Caprice on 01/13/23 by a video enabled telemedicine application and verified that I am speaking with the correct person using two identifiers.   I discussed the limitations of evaluation and management by telemedicine. The patient expressed understanding and agreed to proceed.   ASSESSMENT AND PLAN:  Malignant neoplasm of upper-outer quadrant of left breast in female, estrogen receptor positive ([place]) This is a very pleasant 52 year old premenopausal female patient with newly diagnosed left breast invasive ductal carcinoma, ER/PR positive, HER2 negative referred to medical oncology for recommendations. She underwent lumpectomy followed by Oncotype DX testing.  Final pathology showed a 1.2 cm tumor, grade 2, margins uninvolved, 3 out of 3 sentinel lymph nodes negative.  Prognostics on prior biopsy showed ER 95% positive, PR 100% staining, HER2 negative and KI of 5% Oncotype DX showed a recurrence score of 16, distant recurrence risk at 9 years of 4% with antiestrogen therapy.  There appears to be no significant chemotherapy benefit in this group She completed adjuvant radiation.  She is now on adjuvant Zoladex with  letrozole.  We talked about tamoxifen however she has some underlying endometrial issues hence her gynecologist recommended trying not on tamoxifen options.  She is tolerating this combination remarkably well.  Mammogram scheduled for March 2024.  No breast changes today except for some lymphedema. She will continue this combination and return to clinic to see me in summer.  From thereon will try to see her every summer and she can follow-up with breast surgery every winter.  She was of course encouraged to contact  me sooner with any new questions or concerns.  Bone density September 17, 2022, normal.  I encouraged her walking habit every day.  She can also consider taking some vitamin D supplements and consume calcium rich diet.     Total time spent: 30 minutes All questions were answered. The patient knows to call the clinic with any problems, questions or concerns.    [name], MD 01/13/23

## 2023-01-14 ENCOUNTER — Encounter: Payer: Self-pay | Admitting: Podiatry

## 2023-01-14 ENCOUNTER — Ambulatory Visit (INDEPENDENT_AMBULATORY_CARE_PROVIDER_SITE_OTHER): Payer: BC Managed Care – PPO | Admitting: Podiatry

## 2023-01-14 VITALS — BP 124/72

## 2023-01-14 DIAGNOSIS — M722 Plantar fascial fibromatosis: Secondary | ICD-10-CM

## 2023-01-14 NOTE — Progress Notes (Signed)
Subjective:  Patient ID: Caitlin [name], female    DOB: 06-Aug-1971,  MRN: [id number]  Chief Complaint  Patient presents with   Plantar Fasciitis    52 y.o. female presents with the above complaint.  Patient presents with complaint of left Planter fasciitis.  She states the injection did help.  She still has some residual pain denies any other acute complaints   Review of Systems: Negative except as noted in the HPI. Denies N/V/F/Ch.  Past Medical History:  Diagnosis Date   Anxiety    Back pain    Edema of both lower extremities    Elevated cholesterol    Endometriosis    Endometriosis    Family history of kidney cancer    Family history of ovarian cancer    Gallbladder problem    High cholesterol    History of IBS    IBS (irritable bowel syndrome)    Infertility, female    Vitamin D deficiency     Current Outpatient Medications:    metFORMIN (GLUCOPHAGE-XR) 500 MG 24 hr tablet, SMARTSIG:1 Tablet(s) By Mouth Every Evening, Disp: , Rfl:    Vitamin D, Ergocalciferol, 50000 units CAPS, Take 1 capsule by mouth 2 (two) times a week., Disp: , Rfl:    Cholecalciferol 100 MCG (4000 UT) CAPS, 4000 units every 24 hours by oral route., Disp: , Rfl:    famotidine (PEPCID) 40 MG tablet, Take 40 mg by mouth at bedtime., Disp: , Rfl:    furosemide (LASIX) 20 MG tablet, TAKE ONE TABLET BY MOUTH DAILY, Disp: 30 tablet, Rfl: 6   letrozole (FEMARA) 2.5 MG tablet, Take 1 tablet (2.5 mg total) by mouth daily., Disp: 90 tablet, Rfl: 3   VITAMIN D, CHOLECALCIFEROL, PO, Take 1 tablet by mouth daily., Disp: , Rfl:   Social History   Tobacco Use  Smoking Status Never  Smokeless Tobacco Never    Allergies  Allergen Reactions   Ciprofloxacin Other (See Comments)    "achy joints" "achy joints"    Sulfa Antibiotics Rash   Objective:   Vitals:   01/14/23 1039  BP: 124/72   There is no height or weight on file to calculate BMI. Constitutional Well developed. Well nourished.   Vascular Dorsalis pedis pulses palpable bilaterally. Posterior tibial pulses palpable bilaterally. Capillary refill normal to all digits.  No cyanosis or clubbing noted. Pedal hair growth normal.  Neurologic Normal speech. Oriented to person, place, and time. Epicritic sensation to light touch grossly present bilaterally.  Dermatologic Nails well groomed and normal in appearance. No open wounds. No skin lesions.  Orthopedic: Normal joint ROM without pain or crepitus bilaterally. No visible deformities. Tender to palpation at the calcaneal tuber left. No pain with calcaneal squeeze left. Ankle ROM diminished range of motion left. Silfverskiold Test: positive left.   Radiographs: Taken and reviewed. No acute fractures or dislocations. No evidence of stress fracture.  Plantar heel spur present. Posterior heel spur present.   Assessment:   No diagnosis found.  Plan:  Patient was evaluated and treated and all questions answered.  Plantar Fasciitis, left -Clinically she is doing better after the 2 steroid injection she still has some regression.  However she is able to manage the pain.  I rediscussed retching with her in extensive detail she states understanding  Pes planovalgus -I explained to patient the etiology of pes planovalgus and relationship with Planter fasciitis and various treatment options were discussed.  Given patient foot structure in the setting of Planter fasciitis I  believe patient will benefit from custom-made orthotics to help control the hindfoot motion support the arch of the foot and take the stress away from plantar fascial.  Patient agrees with the plan like to proceed with orthotics -Patient is awaiting orthotics    No follow-ups on file.

## 2023-01-16 DIAGNOSIS — F4321 Adjustment disorder with depressed mood: Secondary | ICD-10-CM | POA: Diagnosis not present

## 2023-01-19 ENCOUNTER — Ambulatory Visit: Payer: BC Managed Care – PPO

## 2023-01-22 ENCOUNTER — Ambulatory Visit: Payer: BC Managed Care – PPO | Admitting: Hematology and Oncology

## 2023-01-22 ENCOUNTER — Ambulatory Visit (INDEPENDENT_AMBULATORY_CARE_PROVIDER_SITE_OTHER): Payer: BC Managed Care – PPO

## 2023-01-22 DIAGNOSIS — Q666 Other congenital valgus deformities of feet: Secondary | ICD-10-CM

## 2023-01-22 DIAGNOSIS — M722 Plantar fascial fibromatosis: Secondary | ICD-10-CM

## 2023-01-22 NOTE — Progress Notes (Signed)
Patient presents today to pick up custom molded foot orthotics, diagnosed with pes planovalgus by Dr. Posey Pronto.   Orthotics were dispensed and fit was satisfactory. Reviewed instructions for break-in and wear. Written instructions given to patient.  Patient will follow up as needed.   [facility] - order # [id number]

## 2023-01-29 NOTE — Progress Notes (Unsigned)
NEUROLOGY FOLLOW UP OFFICE NOTE  KALEEN ROCHETTE [id number]  Assessment/Plan:   Idiopathic intracranial hypertension    Furosemide '20mg'$  daily.  *** Follow up ***  Subjective:  Lexiana C. Sutphen is a 52 year old right-handed female who follows up for migraines and mild idiopathic intracranial hypertension   UPDATE: Current NSAIDs/analgesics:  ibuprofen Current preventative:  furosemide '20mg'$  daily  Feeling well.  No headaches, no visual obscurations, no pulsatile tinnitus.  She has had periods where she may not take it for a couple of weeks at a time but will then notice the pulsatile tinnitus.     HISTORY: She has only had one dull headache since starting the topiramate, when it was snowing, and lasted a couple of hours and didn't require any medication.  Pulsating sensation has decreased significantly.  She may hear it once in a while.  If she stands up quickly or bends over, she may get the head pressure briefly.  She saw Dr. Katy Fitch again and showed minimal improvement in optic nerve but overall stable     HISTORY: She has longstanding history of migraine presenting with visual aura of wavy lines followed by blind spot preceding headache.  Several years ago, she was started on Mirena and developed bilateral pulsatile tinnitus.  She began having new headaches in the Fall of 2020.  They are severe right occipital shooting pain associated with nausea, photophobia, and phonophobia but not associated with speech disturbance, dizziness, numbness or weakness.  She treats with ibuprofen.  In April 2021, she had a cough that triggered a particularly severe occipital headache.  It was aggravated by change in position, either standing up or bending over.  The severe headache subsided but she had a persistent dull right occipital pressure that lasted 2 weeks.  Other than her typical headaches, she has not had a recurrence of that occipital headache since then.  CT of head on 04/26/2020 was  unremarkable.  She had an MRI of brain and internal auditory canals on 06/05/2020 which was normal.  She saw her ophthalmologist, Dr. 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  Visual fields were full.  Repeat exam on 07/05/2020 was stable.  She denies visual obscurations.  She underwent workup for pulsatile tinnitus and idiopathic intracranial hypertension.  TSH from 05/12/2020 was 0.60.   MRA of head and neck on 08/29/2020 was normal.  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  She underwent LP on 09/06/2020 which demonstrated opening pressure of 23 cm water and closing pressure of 17 cm water.    Past medications:  topiramate (hair loss)  PAST MEDICAL HISTORY: Past Medical History:  Diagnosis Date   Anxiety    Back pain    Edema of both lower extremities    Elevated cholesterol    Endometriosis    Endometriosis    Family history of kidney cancer    Family history of ovarian cancer    Gallbladder problem    High cholesterol    History of IBS    IBS (irritable bowel syndrome)    Infertility, female    Vitamin D deficiency     MEDICATIONS: Current Outpatient Medications on File Prior to Visit  Medication Sig Dispense Refill   Cholecalciferol 100 MCG (4000 UT) CAPS 4000 units every 24 hours by oral route.     famotidine (PEPCID) 40 MG tablet Take 40 mg by mouth at bedtime.  furosemide (LASIX) 20 MG tablet TAKE ONE TABLET BY MOUTH DAILY 30 tablet 6   letrozole (FEMARA) 2.5 MG tablet Take 1 tablet (2.5 mg total) by mouth daily. 90 tablet 3   metFORMIN (GLUCOPHAGE-XR) 500 MG 24 hr tablet SMARTSIG:1 Tablet(s) By Mouth Every Evening     VITAMIN D, CHOLECALCIFEROL, PO Take 1 tablet by mouth daily.     Vitamin D, Ergocalciferol, 50000 units CAPS Take 1 capsule by mouth 2 (two) times a week.     No current facility-administered medications on file prior to visit.    ALLERGIES: Allergies   Allergen Reactions   Ciprofloxacin Other (See Comments)    "achy joints" "achy joints"    Sulfa Antibiotics Rash    FAMILY HISTORY: Family History  Problem Relation Age of Onset   Thyroid disease Mother    Depression Mother    Anxiety disorder Mother    Obesity Mother    Hypertension Father    Heart disease Father    Heart disease Maternal Aunt    Diabetes Maternal Aunt    Kidney cancer Maternal Aunt 44   Rheum arthritis Paternal Aunt    Heart disease Maternal Grandmother    COPD Maternal Grandmother    Heart disease Maternal Grandfather    Ovarian cancer Paternal Grandmother        dx in her 52s   Cancer Cousin        unknown cancer in mat first cousin      Objective:  *** General: No acute distress.  Patient appears well-groomed.   Head:  Normocephalic/atraumatic Eyes:  Fundi examined but not visualized Neck: supple, no paraspinal tenderness, full range of motion Heart:  Regular rate and rhythm Neurological Exam: alert and oriented to person, place, and time.  Speech fluent and not dysarthric, language intact.  CN II-XII intact. Bulk and tone normal, muscle strength 5/5 throughout.  Sensation to light touch intact.  Deep tendon reflexes 2+ throughout.  Finger to nose testing intact.  Gait normal, Romberg negative.   Metta Clines, DO  CC: Horald Pollen, MD

## 2023-01-30 DIAGNOSIS — F4321 Adjustment disorder with depressed mood: Secondary | ICD-10-CM | POA: Diagnosis not present

## 2023-02-02 ENCOUNTER — Encounter: Payer: Self-pay | Admitting: Neurology

## 2023-02-02 ENCOUNTER — Other Ambulatory Visit (INDEPENDENT_AMBULATORY_CARE_PROVIDER_SITE_OTHER): Payer: BC Managed Care – PPO

## 2023-02-02 ENCOUNTER — Ambulatory Visit (INDEPENDENT_AMBULATORY_CARE_PROVIDER_SITE_OTHER): Payer: BC Managed Care – PPO | Admitting: Neurology

## 2023-02-02 VITALS — BP 136/78 | HR 56 | Ht 66.0 in | Wt 230.0 lb

## 2023-02-02 DIAGNOSIS — G932 Benign intracranial hypertension: Secondary | ICD-10-CM | POA: Diagnosis not present

## 2023-02-02 LAB — BASIC METABOLIC PANEL
BUN: 18 mg/dL (ref 6–23)
CO2: 30 mEq/L (ref 19–32)
Calcium: 9.7 mg/dL (ref 8.4–10.5)
Chloride: 104 mEq/L (ref 96–112)
Creatinine, Ser: 0.7 mg/dL (ref 0.40–1.20)
GFR: 101.48 mL/min (ref >60.00)
Glucose, Bld: 93 mg/dL (ref 70–99)
Potassium: 4 mEq/L (ref 3.5–5.1)
Sodium: 141 mEq/L (ref 135–145)

## 2023-02-02 NOTE — Patient Instructions (Addendum)
Continue furosemide '20mg'$  daily.  Check BMP Keep eye exam next month and have them send note to me Follow up 6 months.  Your provider has requested that you have labwork completed today. Please go to [facility] ([id number]) on the second floor of this building before leaving the office today. You do not need to check in. If you are not called within 15 minutes please check with the front desk.

## 2023-02-09 ENCOUNTER — Inpatient Hospital Stay: Payer: BC Managed Care – PPO | Attending: Hematology and Oncology

## 2023-02-09 VITALS — BP 127/88 | HR 83 | Temp 98.6°F | Resp 18

## 2023-02-09 DIAGNOSIS — Z5111 Encounter for antineoplastic chemotherapy: Secondary | ICD-10-CM | POA: Insufficient documentation

## 2023-02-09 DIAGNOSIS — C50412 Malignant neoplasm of upper-outer quadrant of left female breast: Secondary | ICD-10-CM | POA: Diagnosis not present

## 2023-02-09 DIAGNOSIS — Z170 Estrogen receptor positive status [ER+]: Secondary | ICD-10-CM

## 2023-02-09 MED ORDER — GOSERELIN ACETATE 3.6 MG ~~LOC~~ IMPL
3.6000 mg | DRUG_IMPLANT | Freq: Once | SUBCUTANEOUS | Status: AC
  Administered 2023-02-09: 3.6 mg via SUBCUTANEOUS
  Filled 2023-02-09: qty 3.6

## 2023-02-09 NOTE — Patient Instructions (Signed)
Goserelin Implant What is this medication? GOSERELIN (GOE se rel in) treats prostate cancer and breast cancer. It works by decreasing levels of the hormones testosterone and estrogen in the body. This prevents prostate and breast cancer cells from spreading or growing. It may also be used to treat endometriosis. This is a condition where the tissue that lines the uterus grows outside the uterus. It works by decreasing the amount of estrogen your body makes, which reduces heavy bleeding and pain. It can also be used to help thin the lining of the uterus before a surgery used to prevent or reduce heavy periods. This medicine may be used for other purposes; ask your health care provider or pharmacist if you have questions. COMMON BRAND NAME(S): Zoladex, Zoladex 3-Month What should I tell my care team before I take this medication? They need to know if you have any of these conditions: Bone problems Diabetes Heart disease History of irregular heartbeat or rhythm An unusual or allergic reaction to goserelin, other medications, foods, dyes, or preservatives Pregnant or trying to get pregnant Breastfeeding How should I use this medication? This medication is injected under the skin. It is given by your care team in a hospital or clinic setting. Talk to your care team about the use of this medication in children. Special care may be needed. Overdosage: If you think you have taken too much of this medicine contact a poison control center or emergency room at once. NOTE: This medicine is only for you. Do not share this medicine with others. What if I miss a dose? Keep appointments for follow-up doses. It is important not to miss your dose. Call your care team if you are unable to keep an appointment. What may interact with this medication? Do not take this medication with any of the following: Cisapride Dronedarone Pimozide Thioridazine This medication may also interact with the following: Other  medications that cause heart rhythm changes This list may not describe all possible interactions. Give your health care provider a list of all the medicines, herbs, non-prescription drugs, or dietary supplements you use. Also tell them if you smoke, drink alcohol, or use illegal drugs. Some items may interact with your medicine. What should I watch for while using this medication? Visit your care team for regular checks on your progress. Your symptoms may appear to get worse during the first weeks of this therapy. Tell your care team if your symptoms do not start to get better or if they get worse after this time. Using this medication for a long time may weaken your bones. If you smoke or frequently drink alcohol you may increase your risk of bone loss. A family history of osteoporosis, chronic use of medications for seizures (convulsions), or corticosteroids can also increase your risk of bone loss. The risk of bone fractures may be increased. Talk to your care team about your bone health. This medication may increase blood sugar. The risk may be higher in patients who already have diabetes. Ask your care team what you can do to lower your risk of diabetes while taking this medication. This medication should stop regular monthly menstruation in women. Tell your care team if you continue to menstruate. Talk to your care team if you wish to become pregnant or think you might be pregnant. This medication can cause serious birth defects if taken during pregnancy or for 12 weeks after stopping treatment. Talk to your care team about reliable forms of contraception. Do not breastfeed while taking this   medication. This medication may cause infertility. Talk to your care team if you are concerned about your fertility. What side effects may I notice from receiving this medication? Side effects that you should report to your care team as soon as possible: Allergic reactions--skin rash, itching, hives, swelling  of the face, lips, tongue, or throat Change in the amount of urine Heart attack--pain or tightness in the chest, shoulders, arms, or jaw, nausea, shortness of breath, cold or clammy skin, feeling faint or lightheaded Heart rhythm changes--fast or irregular heartbeat, dizziness, feeling faint or lightheaded, chest pain, trouble breathing High blood sugar (hyperglycemia)--increased thirst or amount of urine, unusual weakness or fatigue, blurry vision High calcium level--increased thirst or amount of urine, nausea, vomiting, confusion, unusual weakness or fatigue, bone pain Pain, redness, irritation, or bruising at the injection site Severe back pain, numbness or weakness of the hands, arms, legs, or feet, loss of coordination, loss of bowel or bladder control Stroke--sudden numbness or weakness of the face, arm, or leg, trouble speaking, confusion, trouble walking, loss of balance or coordination, dizziness, severe headache, change in vision Swelling and pain of the tumor site or lymph nodes Trouble passing urine Side effects that usually do not require medical attention (report to your care team if they continue or are bothersome): Change in sex drive or performance Headache Hot flashes Rapid or extreme change in emotion or mood Sweating Swelling of the ankles, hands, or feet Unusual vaginal discharge, itching, or odor This list may not describe all possible side effects. Call your doctor for medical advice about side effects. You may report side effects to FDA at 1-800-FDA-1088. Where should I keep my medication? This medication is given in a hospital or clinic. It will not be stored at home. NOTE: This sheet is a summary. It may not cover all possible information. If you have questions about this medicine, talk to your doctor, pharmacist, or health care provider.  2023 Elsevier/Gold Standard (2008-02-05 00:00:00)  

## 2023-02-24 DIAGNOSIS — Z01419 Encounter for gynecological examination (general) (routine) without abnormal findings: Secondary | ICD-10-CM | POA: Diagnosis not present

## 2023-02-26 DIAGNOSIS — F4321 Adjustment disorder with depressed mood: Secondary | ICD-10-CM | POA: Diagnosis not present

## 2023-03-04 DIAGNOSIS — H1045 Other chronic allergic conjunctivitis: Secondary | ICD-10-CM | POA: Diagnosis not present

## 2023-03-04 DIAGNOSIS — H04123 Dry eye syndrome of bilateral lacrimal glands: Secondary | ICD-10-CM | POA: Diagnosis not present

## 2023-03-04 DIAGNOSIS — H4711 Papilledema associated with increased intracranial pressure: Secondary | ICD-10-CM | POA: Diagnosis not present

## 2023-03-04 DIAGNOSIS — H47323 Drusen of optic disc, bilateral: Secondary | ICD-10-CM | POA: Diagnosis not present

## 2023-03-09 ENCOUNTER — Inpatient Hospital Stay: Payer: BC Managed Care – PPO | Attending: Hematology and Oncology

## 2023-03-09 VITALS — BP 138/93 | HR 82 | Temp 98.4°F | Resp 18

## 2023-03-09 DIAGNOSIS — Z5111 Encounter for antineoplastic chemotherapy: Secondary | ICD-10-CM | POA: Insufficient documentation

## 2023-03-09 DIAGNOSIS — Z170 Estrogen receptor positive status [ER+]: Secondary | ICD-10-CM | POA: Diagnosis not present

## 2023-03-09 DIAGNOSIS — C50412 Malignant neoplasm of upper-outer quadrant of left female breast: Secondary | ICD-10-CM | POA: Insufficient documentation

## 2023-03-09 MED ORDER — GOSERELIN ACETATE 3.6 MG ~~LOC~~ IMPL
3.6000 mg | DRUG_IMPLANT | Freq: Once | SUBCUTANEOUS | Status: AC
  Administered 2023-03-09: 3.6 mg via SUBCUTANEOUS
  Filled 2023-03-09: qty 3.6

## 2023-03-09 NOTE — Patient Instructions (Signed)
Goserelin Implant What is this medication? GOSERELIN (GOE se rel in) treats prostate cancer and breast cancer. It works by decreasing levels of the hormones testosterone and estrogen in the body. This prevents prostate and breast cancer cells from spreading or growing. It may also be used to treat endometriosis. This is a condition where the tissue that lines the uterus grows outside the uterus. It works by decreasing the amount of estrogen your body makes, which reduces heavy bleeding and pain. It can also be used to help thin the lining of the uterus before a surgery used to prevent or reduce heavy periods. This medicine may be used for other purposes; ask your health care provider or pharmacist if you have questions. COMMON BRAND NAME(S): Zoladex, Zoladex 3-Month What should I tell my care team before I take this medication? They need to know if you have any of these conditions: Bone problems Diabetes Heart disease History of irregular heartbeat or rhythm An unusual or allergic reaction to goserelin, other medications, foods, dyes, or preservatives Pregnant or trying to get pregnant Breastfeeding How should I use this medication? This medication is injected under the skin. It is given by your care team in a hospital or clinic setting. Talk to your care team about the use of this medication in children. Special care may be needed. Overdosage: If you think you have taken too much of this medicine contact a poison control center or emergency room at once. NOTE: This medicine is only for you. Do not share this medicine with others. What if I miss a dose? Keep appointments for follow-up doses. It is important not to miss your dose. Call your care team if you are unable to keep an appointment. What may interact with this medication? Do not take this medication with any of the following: Cisapride Dronedarone Pimozide Thioridazine This medication may also interact with the following: Other  medications that cause heart rhythm changes This list may not describe all possible interactions. Give your health care provider a list of all the medicines, herbs, non-prescription drugs, or dietary supplements you use. Also tell them if you smoke, drink alcohol, or use illegal drugs. Some items may interact with your medicine. What should I watch for while using this medication? Visit your care team for regular checks on your progress. Your symptoms may appear to get worse during the first weeks of this therapy. Tell your care team if your symptoms do not start to get better or if they get worse after this time. Using this medication for a long time may weaken your bones. If you smoke or frequently drink alcohol you may increase your risk of bone loss. A family history of osteoporosis, chronic use of medications for seizures (convulsions), or corticosteroids can also increase your risk of bone loss. The risk of bone fractures may be increased. Talk to your care team about your bone health. This medication may increase blood sugar. The risk may be higher in patients who already have diabetes. Ask your care team what you can do to lower your risk of diabetes while taking this medication. This medication should stop regular monthly menstruation in women. Tell your care team if you continue to menstruate. Talk to your care team if you wish to become pregnant or think you might be pregnant. This medication can cause serious birth defects if taken during pregnancy or for 12 weeks after stopping treatment. Talk to your care team about reliable forms of contraception. Do not breastfeed while taking this   medication. This medication may cause infertility. Talk to your care team if you are concerned about your fertility. What side effects may I notice from receiving this medication? Side effects that you should report to your care team as soon as possible: Allergic reactions--skin rash, itching, hives, swelling  of the face, lips, tongue, or throat Change in the amount of urine Heart attack--pain or tightness in the chest, shoulders, arms, or jaw, nausea, shortness of breath, cold or clammy skin, feeling faint or lightheaded Heart rhythm changes--fast or irregular heartbeat, dizziness, feeling faint or lightheaded, chest pain, trouble breathing High blood sugar (hyperglycemia)--increased thirst or amount of urine, unusual weakness or fatigue, blurry vision High calcium level--increased thirst or amount of urine, nausea, vomiting, confusion, unusual weakness or fatigue, bone pain Pain, redness, irritation, or bruising at the injection site Severe back pain, numbness or weakness of the hands, arms, legs, or feet, loss of coordination, loss of bowel or bladder control Stroke--sudden numbness or weakness of the face, arm, or leg, trouble speaking, confusion, trouble walking, loss of balance or coordination, dizziness, severe headache, change in vision Swelling and pain of the tumor site or lymph nodes Trouble passing urine Side effects that usually do not require medical attention (report to your care team if they continue or are bothersome): Change in sex drive or performance Headache Hot flashes Rapid or extreme change in emotion or mood Sweating Swelling of the ankles, hands, or feet Unusual vaginal discharge, itching, or odor This list may not describe all possible side effects. Call your doctor for medical advice about side effects. You may report side effects to FDA at 1-800-FDA-1088. Where should I keep my medication? This medication is given in a hospital or clinic. It will not be stored at home. NOTE: This sheet is a summary. It may not cover all possible information. If you have questions about this medicine, talk to your doctor, pharmacist, or health care provider.  2023 Elsevier/Gold Standard (2022-04-30 00:00:00)  

## 2023-03-12 ENCOUNTER — Ambulatory Visit
Admission: RE | Admit: 2023-03-12 | Discharge: 2023-03-12 | Disposition: A | Discharge status: Home / Self Care | Payer: BC Managed Care – PPO | Source: Ambulatory Visit | Attending: Hematology and Oncology | Admitting: Hematology and Oncology

## 2023-03-12 DIAGNOSIS — R928 Other abnormal and inconclusive findings on diagnostic imaging of breast: Secondary | ICD-10-CM | POA: Diagnosis not present

## 2023-03-12 DIAGNOSIS — Z853 Personal history of malignant neoplasm of breast: Secondary | ICD-10-CM | POA: Diagnosis not present

## 2023-03-12 DIAGNOSIS — C50412 Malignant neoplasm of upper-outer quadrant of left female breast: Secondary | ICD-10-CM

## 2023-03-12 HISTORY — DX: Malignant neoplasm of unspecified site of unspecified female breast: C50.919

## 2023-03-12 HISTORY — DX: Personal history of irradiation: Z92.3

## 2023-03-19 DIAGNOSIS — F4321 Adjustment disorder with depressed mood: Secondary | ICD-10-CM | POA: Diagnosis not present

## 2023-03-26 DIAGNOSIS — R030 Elevated blood-pressure reading, without diagnosis of hypertension: Secondary | ICD-10-CM | POA: Insufficient documentation

## 2023-03-26 DIAGNOSIS — R300 Dysuria: Secondary | ICD-10-CM | POA: Diagnosis not present

## 2023-03-26 DIAGNOSIS — R399 Unspecified symptoms and signs involving the genitourinary system: Secondary | ICD-10-CM | POA: Diagnosis not present

## 2023-04-06 ENCOUNTER — Inpatient Hospital Stay: Payer: BC Managed Care – PPO | Attending: Hematology and Oncology

## 2023-04-06 ENCOUNTER — Other Ambulatory Visit: Payer: Self-pay | Admitting: Hematology and Oncology

## 2023-04-06 VITALS — BP 129/85 | HR 75 | Temp 98.7°F | Resp 18

## 2023-04-06 DIAGNOSIS — Z79818 Long term (current) use of other agents affecting estrogen receptors and estrogen levels: Secondary | ICD-10-CM | POA: Diagnosis not present

## 2023-04-06 DIAGNOSIS — C50412 Malignant neoplasm of upper-outer quadrant of left female breast: Secondary | ICD-10-CM | POA: Insufficient documentation

## 2023-04-06 DIAGNOSIS — Z5111 Encounter for antineoplastic chemotherapy: Secondary | ICD-10-CM | POA: Insufficient documentation

## 2023-04-06 MED ORDER — GOSERELIN ACETATE 3.6 MG ~~LOC~~ IMPL
3.6000 mg | DRUG_IMPLANT | SUBCUTANEOUS | Status: DC
  Administered 2023-04-06: 3.6 mg via SUBCUTANEOUS
  Filled 2023-04-06: qty 3.6

## 2023-04-09 DIAGNOSIS — F4321 Adjustment disorder with depressed mood: Secondary | ICD-10-CM | POA: Diagnosis not present

## 2023-04-20 ENCOUNTER — Ambulatory Visit: Payer: BC Managed Care – PPO | Attending: Radiation Oncology

## 2023-04-20 VITALS — Wt 238.4 lb

## 2023-04-20 DIAGNOSIS — Z483 Aftercare following surgery for neoplasm: Secondary | ICD-10-CM | POA: Insufficient documentation

## 2023-04-20 NOTE — Therapy (Signed)
OUTPATIENT PHYSICAL THERAPY SOZO SCREENING NOTE   Patient Name: Caitlin [name] MRN: [id number] DOB:1971/05/03, 52 y.o., female Today's Date: 04/20/2023  PCP: Dois Davenport, MD REFERRING PROVIDER: Lonie Peak, MD   PT End of Session - 04/20/23 0845     Visit Number 18   # unchanged due to screen only   PT Start Time 0843    PT Stop Time 0847    PT Time Calculation (min) 4 min    Activity Tolerance Patient tolerated treatment well    Behavior During Therapy WFL for tasks assessed/performed             Past Medical History:  Diagnosis Date   Anxiety    Back pain    Breast cancer (HCC)    Edema of both lower extremities    Elevated cholesterol    Endometriosis    Endometriosis    Family history of kidney cancer    Family history of ovarian cancer    Gallbladder problem    High cholesterol    History of IBS    IBS (irritable bowel syndrome)    Infertility, female    Personal history of radiation therapy    Vitamin D deficiency    Past Surgical History:  Procedure Laterality Date   BREAST LUMPECTOMY     BREAST LUMPECTOMY WITH RADIOACTIVE SEED AND SENTINEL LYMPH NODE BIOPSY Left 04/22/2022   Procedure: LEFT BREAST LUMPECTOMY WITH RADIOACTIVE SEED;  Surgeon: Abigail Miyamoto, MD;  Location: [facility] OR;  Service: General;  Laterality: Left;  LMA   CHOLECYSTECTOMY  2018   LAPAROSCOPY     SENTINEL NODE BIOPSY Left 04/22/2022   Procedure: LEFT SENTINEL LYMPH NODE BIOPSY;  Surgeon: Abigail Miyamoto, MD;  Location: [facility] OR;  Service: General;  Laterality: Left;   Patient Active Problem List   Diagnosis Date Noted   Family history of colonic polyps 10/22/2022   Genetic testing 04/29/2022   Family history of ovarian cancer 04/15/2022   Family history of kidney cancer 04/15/2022   Malignant neoplasm of upper-outer quadrant of left breast in female, estrogen receptor positive 04/02/2022   Vitamin D insufficiency 02/18/2021   Elevated cholesterol 02/18/2021   Class 1  obesity due to excess calories with body mass index (BMI) of 34.0 to 34.9 in adult 02/07/2021   Irritable bowel syndrome 02/07/2021    REFERRING DIAG: left breast cancer at risk for lymphedema  THERAPY DIAG:  Aftercare following surgery for neoplasm  PERTINENT HISTORY: Patient was diagnosed with left IDC. ER/PR positive, HER2 negative KI67 less than 5%. Pt had left lumpectomy and SLNB on 04/22/22 with Dr. Magnus Ivan. Radiation. No chemotherapy.  Radiation completed 07/09/22. Starting tamoxifen.   PRECAUTIONS: left UE Lymphedema risk, None  SUBJECTIVE: Pt returns for her 3 month L-Dex screen.   PAIN:  Are you having pain? No  SOZO SCREENING: Patient was assessed today using the SOZO machine to determine the lymphedema index score. This was compared to her baseline score. It was determined that she is within the recommended range when compared to her baseline and no further action is needed at this time. She will continue SOZO screenings. These are done every 3 months for 2 years post operatively followed by every 6 months for 2 years, and then annually.   L-DEX FLOWSHEETS - 04/20/23 0800       L-DEX LYMPHEDEMA SCREENING   Measurement Type Unilateral    L-DEX MEASUREMENT EXTREMITY Upper Extremity    POSITION  Standing    DOMINANT SIDE  Right    At Risk Side Left    BASELINE SCORE (UNILATERAL) -2.6    L-DEX SCORE (UNILATERAL) -2.2    VALUE CHANGE (UNILAT) 0.4               Hermenia Bers, PTA 04/20/2023, 8:46 AM

## 2023-04-30 DIAGNOSIS — F4321 Adjustment disorder with depressed mood: Secondary | ICD-10-CM | POA: Diagnosis not present

## 2023-05-01 IMAGING — MG MM PLC BREAST LOC DEV 1ST LESION INC MAMMO GUIDE*L*
7 series · 7 of 7 positions shown · non-contrast
Comparison: Previous exam(s).

CLINICAL DATA: 50-year-old female for radioactive seed localization
of LEFT breast cancer prior to lumpectomy.

EXAM:
MAMMOGRAPHIC GUIDED RADIOACTIVE SEED LOCALIZATION OF THE LEFT BREAST

[L CC (1 of 3)]
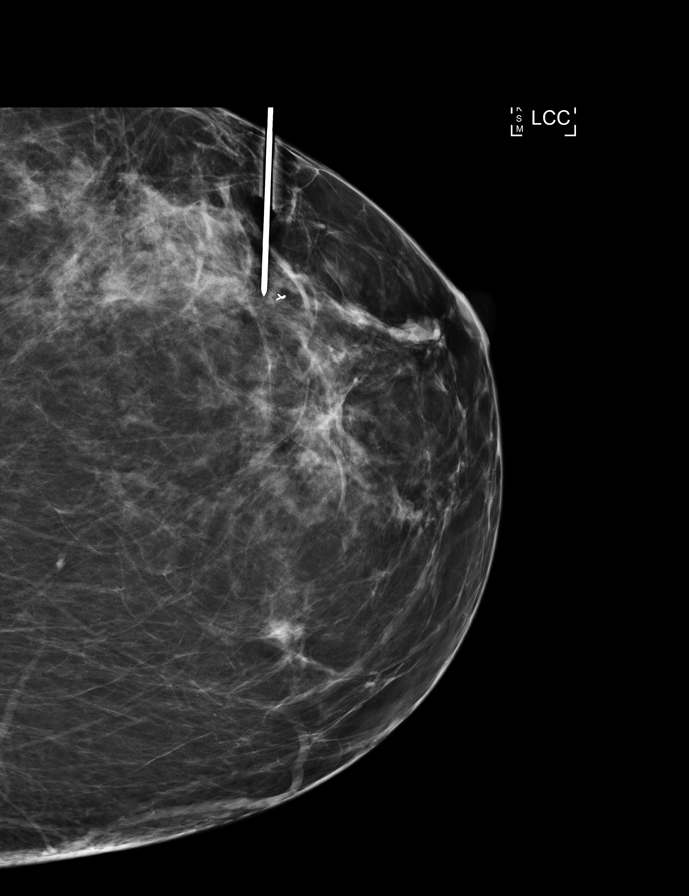

[L LM (1 of 4)]
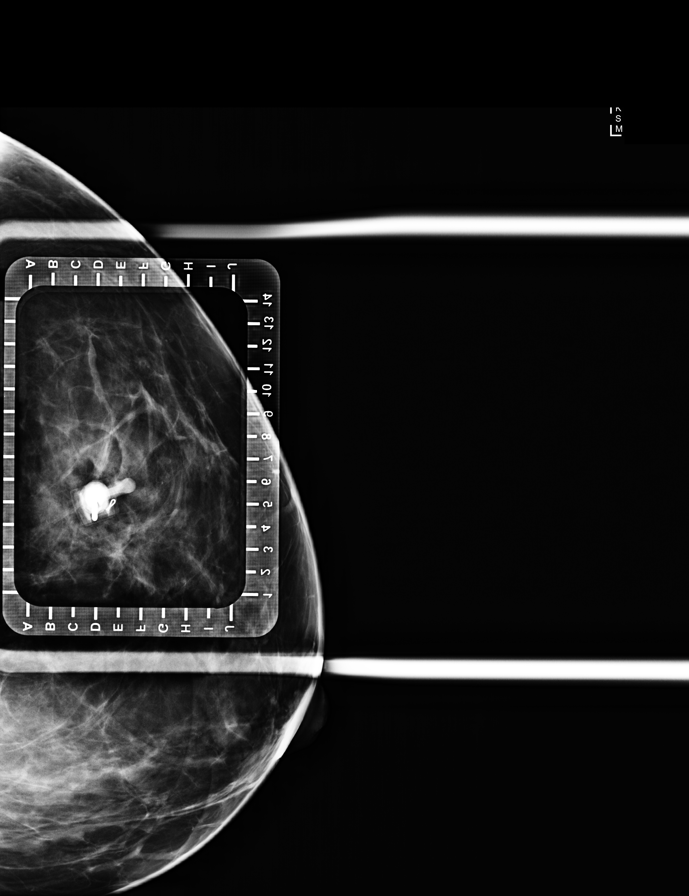

[L CC (2 of 3)]
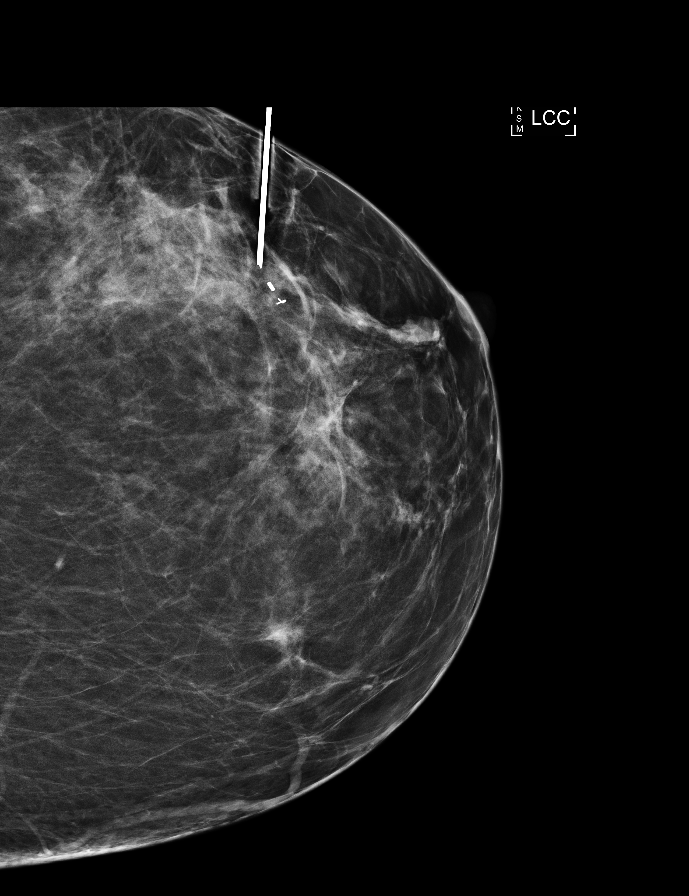

[L CC (3 of 3)]
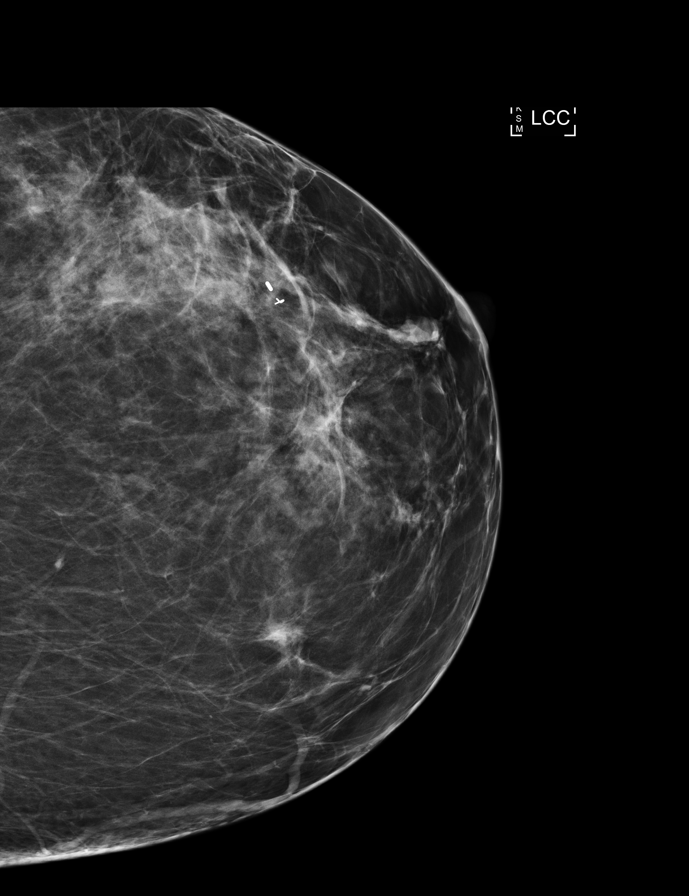

[L LM (2 of 4)]
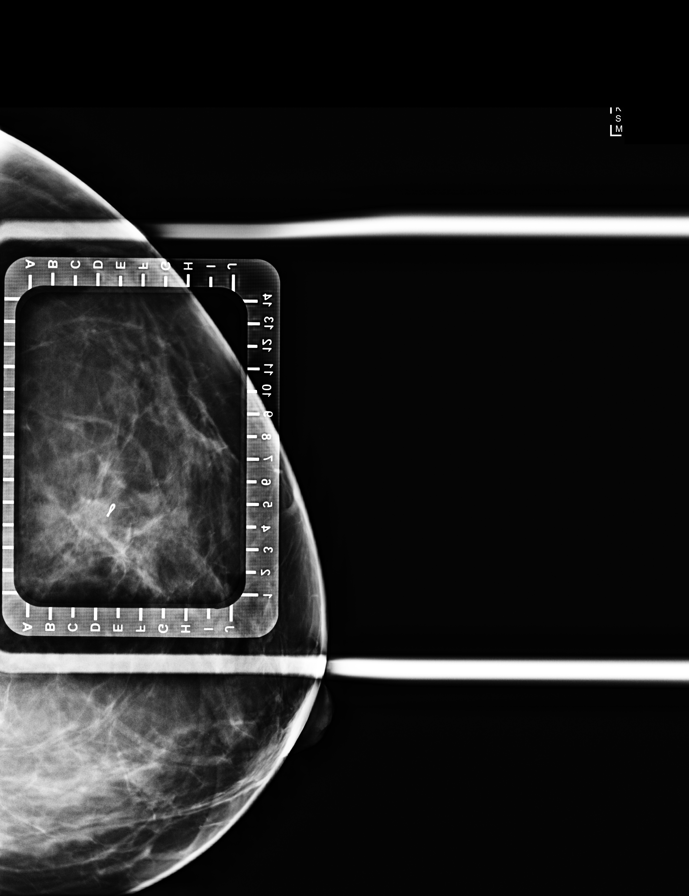

[L LM (3 of 4)]
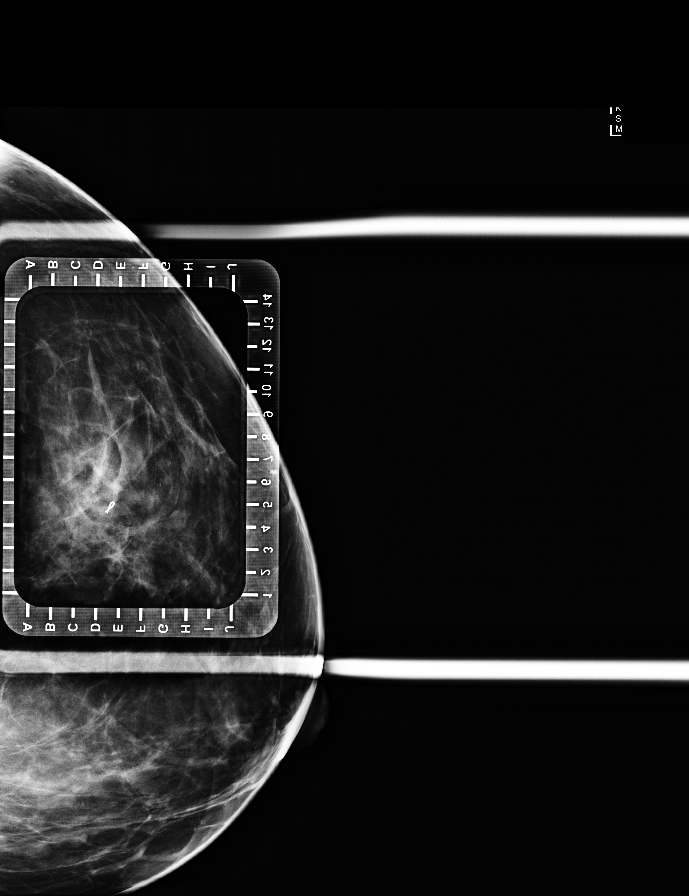

[L LM (4 of 4)]
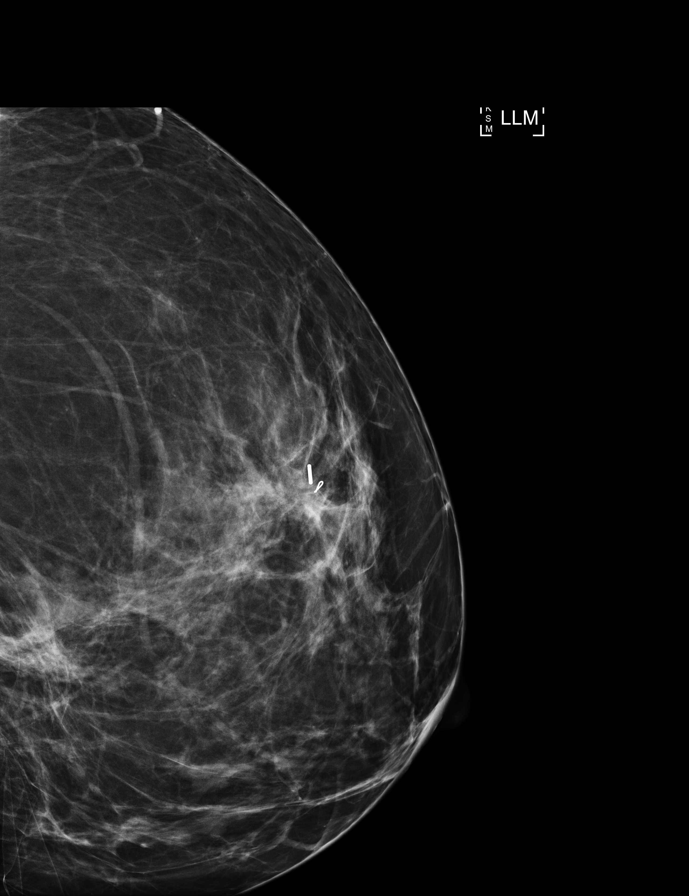

[7 of 7 positions shown; findings below may reference images not displayed]

FINDINGS: Patient presents for radioactive seed localization prior to LEFT
lumpectomy. I met with the patient and we discussed the procedure of
seed localization including benefits and alternatives. We discussed
the high likelihood of a successful procedure. We discussed the
risks of the procedure including infection, bleeding, tissue injury
and further surgery. We discussed the low dose of radioactivity
involved in the procedure. Informed, written consent was given.

The usual time-out protocol was performed immediately prior to the
procedure.

Using mammographic guidance, sterile technique, 1% lidocaine and an
C-HLV radioactive seed, the RIBBON biopsy clip was localized using a
LATERAL approach. The follow-up mammogram images confirm the seed in
the expected location and were marked for Dr. Macklin.

Follow-up survey of the patient confirms presence of the radioactive
seed.

Order number of C-HLV seed:  [id number].

Total activity:  0.251 millicuries.  Reference Date: 03/19/2022.

The patient tolerated the procedure well and was released from the
[REDACTED]. She was given instructions regarding seed removal.
IMPRESSION: Radioactive seed localization LEFT breast. No apparent
complications.

## 2023-05-02 IMAGING — MG MM BREAST SURGICAL SPECIMEN
2 series · 2 of 2 positions shown · non-contrast
Comparison: Previous exam(s).

CLINICAL DATA: Evaluate specimen

EXAM:
SPECIMEN RADIOGRAPH OF THE LEFT BREAST

[L (1 of 2)]
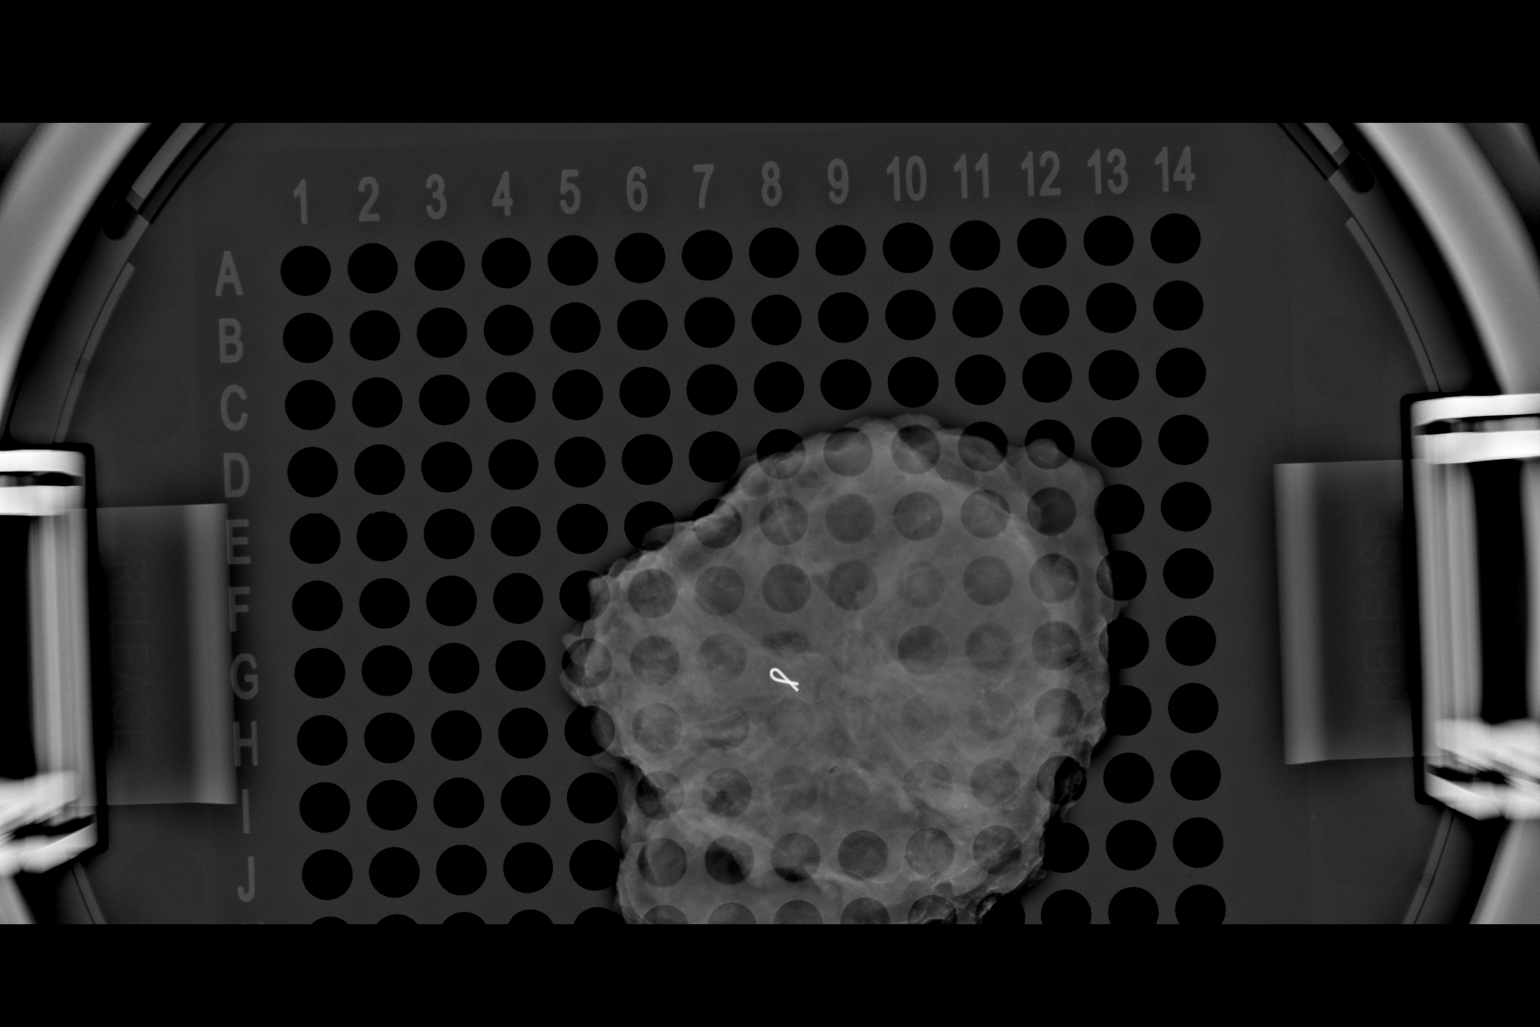

[L (2 of 2)]
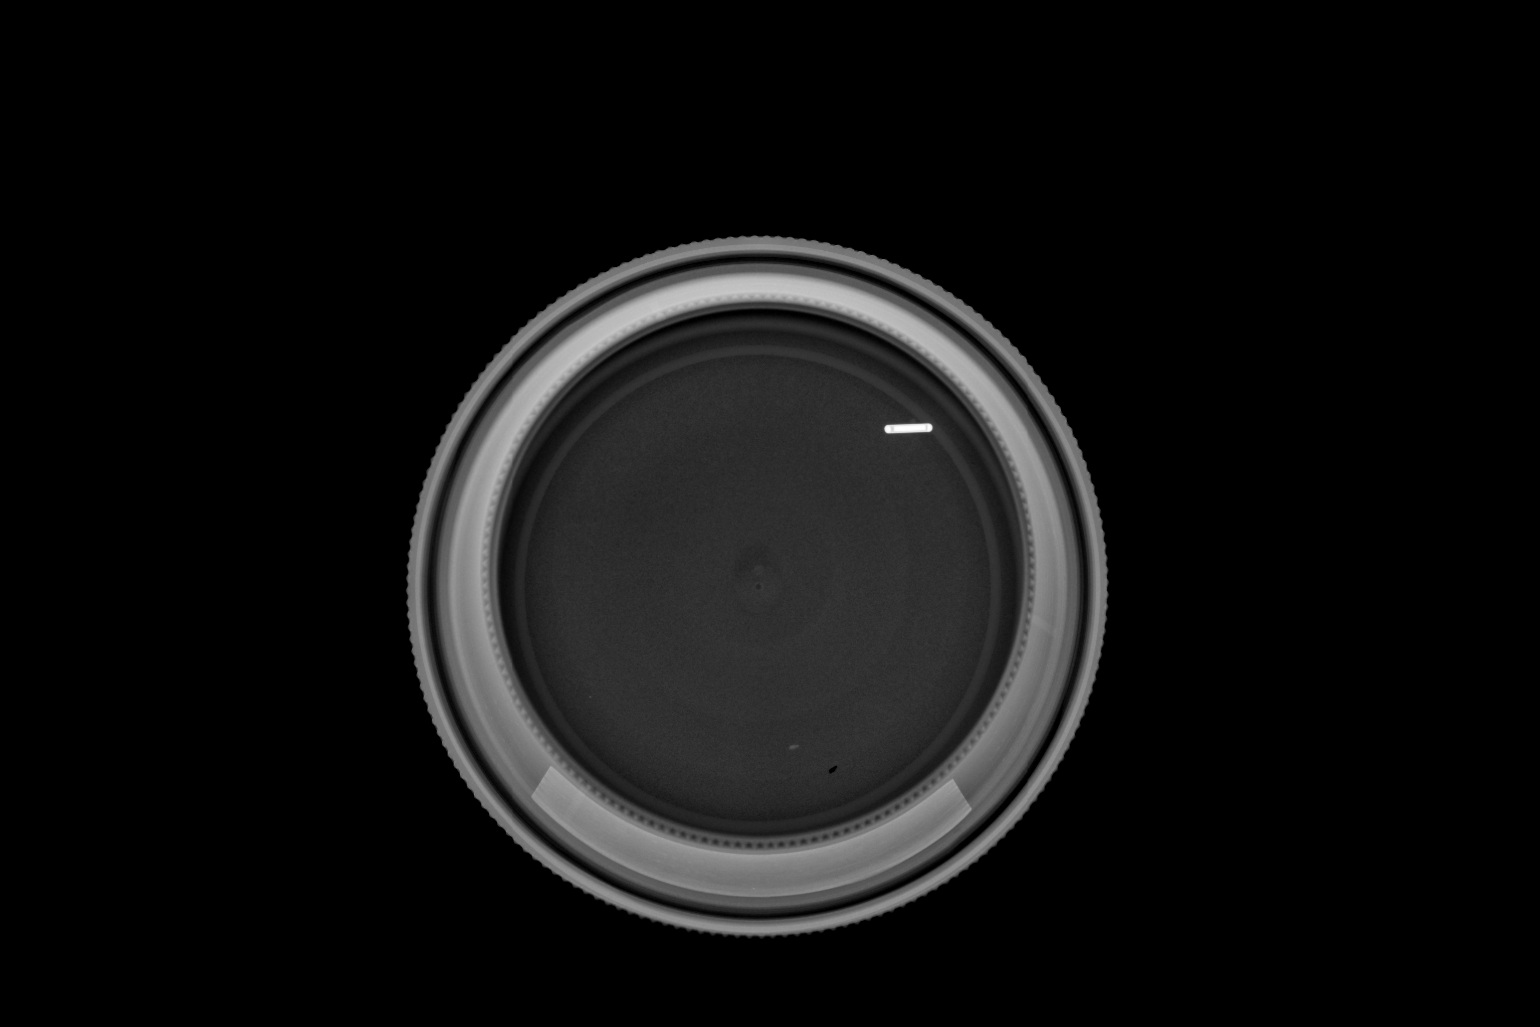

[2 of 2 positions shown; findings below may reference images not displayed]

FINDINGS: Status post excision of the left breast. The radioactive seed and
biopsy marker clip are present, completely intact, and were marked
for pathology. Of note, the radioactive seed is in its on container,
outside of the specimen.
IMPRESSION: Specimen radiograph of the left breast.

## 2023-05-04 ENCOUNTER — Inpatient Hospital Stay: Payer: BC Managed Care – PPO | Attending: Hematology and Oncology

## 2023-05-04 VITALS — BP 127/88 | HR 74 | Temp 98.7°F | Resp 18

## 2023-05-04 DIAGNOSIS — C50412 Malignant neoplasm of upper-outer quadrant of left female breast: Secondary | ICD-10-CM | POA: Insufficient documentation

## 2023-05-04 DIAGNOSIS — Z5111 Encounter for antineoplastic chemotherapy: Secondary | ICD-10-CM | POA: Insufficient documentation

## 2023-05-04 DIAGNOSIS — Z170 Estrogen receptor positive status [ER+]: Secondary | ICD-10-CM

## 2023-05-04 MED ORDER — GOSERELIN ACETATE 3.6 MG ~~LOC~~ IMPL
3.6000 mg | DRUG_IMPLANT | Freq: Once | SUBCUTANEOUS | Status: AC
  Administered 2023-05-04: 3.6 mg via SUBCUTANEOUS
  Filled 2023-05-04: qty 3.6

## 2023-05-04 NOTE — Addendum Note (Signed)
Addended by: Lorayne Marek on: 05/04/2023 02:14 PM   Modules accepted: Orders

## 2023-05-21 DIAGNOSIS — F4321 Adjustment disorder with depressed mood: Secondary | ICD-10-CM | POA: Diagnosis not present

## 2023-06-01 ENCOUNTER — Inpatient Hospital Stay: Payer: BC Managed Care – PPO | Attending: Hematology and Oncology

## 2023-06-01 VITALS — BP 121/81 | HR 105 | Temp 98.4°F | Resp 20

## 2023-06-01 DIAGNOSIS — C50412 Malignant neoplasm of upper-outer quadrant of left female breast: Secondary | ICD-10-CM | POA: Diagnosis not present

## 2023-06-01 DIAGNOSIS — Z5111 Encounter for antineoplastic chemotherapy: Secondary | ICD-10-CM | POA: Diagnosis not present

## 2023-06-01 MED ORDER — GOSERELIN ACETATE 3.6 MG ~~LOC~~ IMPL
3.6000 mg | DRUG_IMPLANT | SUBCUTANEOUS | Status: DC
  Administered 2023-06-01: 3.6 mg via SUBCUTANEOUS
  Filled 2023-06-01: qty 3.6

## 2023-06-10 DIAGNOSIS — F4321 Adjustment disorder with depressed mood: Secondary | ICD-10-CM | POA: Diagnosis not present

## 2023-06-24 DIAGNOSIS — L308 Other specified dermatitis: Secondary | ICD-10-CM | POA: Diagnosis not present

## 2023-06-24 DIAGNOSIS — I872 Venous insufficiency (chronic) (peripheral): Secondary | ICD-10-CM | POA: Diagnosis not present

## 2023-06-29 ENCOUNTER — Inpatient Hospital Stay (HOSPITAL_BASED_OUTPATIENT_CLINIC_OR_DEPARTMENT_OTHER): Payer: BC Managed Care – PPO | Admitting: Hematology and Oncology

## 2023-06-29 ENCOUNTER — Inpatient Hospital Stay: Payer: BC Managed Care – PPO

## 2023-06-29 ENCOUNTER — Inpatient Hospital Stay: Payer: BC Managed Care – PPO | Attending: Hematology and Oncology

## 2023-06-29 ENCOUNTER — Other Ambulatory Visit: Payer: Self-pay

## 2023-06-29 ENCOUNTER — Inpatient Hospital Stay: Payer: BC Managed Care – PPO | Admitting: Adult Health

## 2023-06-29 VITALS — BP 135/95 | HR 88 | Temp 97.1°F | Resp 18 | Wt 242.1 lb

## 2023-06-29 DIAGNOSIS — Z8051 Family history of malignant neoplasm of kidney: Secondary | ICD-10-CM | POA: Diagnosis not present

## 2023-06-29 DIAGNOSIS — Z5111 Encounter for antineoplastic chemotherapy: Secondary | ICD-10-CM | POA: Diagnosis not present

## 2023-06-29 DIAGNOSIS — Z9221 Personal history of antineoplastic chemotherapy: Secondary | ICD-10-CM | POA: Insufficient documentation

## 2023-06-29 DIAGNOSIS — E7800 Pure hypercholesterolemia, unspecified: Secondary | ICD-10-CM | POA: Insufficient documentation

## 2023-06-29 DIAGNOSIS — Z8041 Family history of malignant neoplasm of ovary: Secondary | ICD-10-CM | POA: Insufficient documentation

## 2023-06-29 DIAGNOSIS — Z79899 Other long term (current) drug therapy: Secondary | ICD-10-CM | POA: Diagnosis not present

## 2023-06-29 DIAGNOSIS — R232 Flushing: Secondary | ICD-10-CM | POA: Insufficient documentation

## 2023-06-29 DIAGNOSIS — K589 Irritable bowel syndrome without diarrhea: Secondary | ICD-10-CM | POA: Diagnosis not present

## 2023-06-29 DIAGNOSIS — Z79811 Long term (current) use of aromatase inhibitors: Secondary | ICD-10-CM | POA: Insufficient documentation

## 2023-06-29 DIAGNOSIS — Z923 Personal history of irradiation: Secondary | ICD-10-CM | POA: Diagnosis not present

## 2023-06-29 DIAGNOSIS — E559 Vitamin D deficiency, unspecified: Secondary | ICD-10-CM | POA: Diagnosis not present

## 2023-06-29 DIAGNOSIS — Z170 Estrogen receptor positive status [ER+]: Secondary | ICD-10-CM | POA: Diagnosis not present

## 2023-06-29 DIAGNOSIS — C50412 Malignant neoplasm of upper-outer quadrant of left female breast: Secondary | ICD-10-CM

## 2023-06-29 DIAGNOSIS — I890 Lymphedema, not elsewhere classified: Secondary | ICD-10-CM | POA: Diagnosis not present

## 2023-06-29 MED ORDER — VENLAFAXINE HCL ER 75 MG PO CP24: 75.0000 mg | ORAL_CAPSULE | Freq: Every day | ORAL | 1 refills | Status: DC

## 2023-06-29 MED ORDER — GOSERELIN ACETATE 3.6 MG ~~LOC~~ IMPL
3.6000 mg | DRUG_IMPLANT | SUBCUTANEOUS | Status: DC
  Administered 2023-06-29: 3.6 mg via SUBCUTANEOUS
  Filled 2023-06-29: qty 3.6

## 2023-06-29 NOTE — Assessment & Plan Note (Addendum)
This is a very pleasant 52 year old premenopausal female patient with newly diagnosed left breast invasive ductal carcinoma, ER/PR positive, HER2 negative referred to medical oncology for recommendations. She underwent lumpectomy followed by Oncotype DX testing.  Final pathology showed a 1.2 cm tumor, grade 2, margins uninvolved, 3 out of 3 sentinel lymph nodes negative.  Prognostics on prior biopsy showed ER 95% positive, PR 100% staining, HER2 negative and KI of 5% Oncotype DX showed a recurrence score of 16, distant recurrence risk at 9 years of 4% with antiestrogen therapy.  There appears to be no significant chemotherapy benefit in this group She completed adjuvant radiation.  She is now on adjuvant Zoladex with letrozole.  We talked about tamoxifen however she has some underlying endometrial issues hence her gynecologist recommended trying not on tamoxifen options.  She is tolerating this combination remarkably well.   Mammogram 2024 neg for malignancy.  She is understandably dealing with a lot of emotional issues, feeling very tearful, sad and wonders if she has to take some antidepressants.  She states this mental component of menopause is tough on her. States that she is also finding it very difficult to lose weight and is wondering if we can assist with this.  I recommended that we start some antidepressants to help the mood as well as the hot flashes, I will give her a prescription for Effexor 75 mg daily. For weight loss, I understand she is trying her best but she may need the help of weight loss clinic hence referred to medical weight management.  She will otherwise see her gynecologist in winter and return to clinic for follow-up with [place] in summer.  Encouraged some weightbearing exercises.

## 2023-06-29 NOTE — Progress Notes (Signed)
[facility] NOTE  Patient Care Team: [name], MD as PCP - General (Family Medicine) [name], MD as Consulting Physician (Hematology and Oncology) Eppie Gibson, MD as Attending Physician (Radiation Oncology) Coralie Keens, MD as Consulting Physician (General Surgery)  CHIEF COMPLAINTS/PURPOSE OF CONSULTATION:  Breast cancer follow up.  HISTORY OF PRESENTING ILLNESS:  Caitlin [name] 52 y.o. female is here because of recent diagnosis of left breast cancer.  I reviewed her records extensively and collaborated the history with the patient.  SUMMARY OF ONCOLOGIC HISTORY: Oncology History  Malignant neoplasm of upper-outer quadrant of left breast in female, estrogen receptor positive ([place])  03/10/2022 Mammogram   Mammogram of the left breast showed irregular hypoechoic mass in the left breast.  Targeted ultrasound performed also confirmed an irregular hypoechoic mass in the left breast at 2:00 2 cm from the nipple measuring 1.1 x 0.8 x 0.5 cm.  Left axilla did not show any enlarged lymphadenopathy.   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  Prognostic showed ER 95% positive moderate staining.  PR 100% positive strong staining.  HER2 equivocal by IHC.  FISH negative    04/02/2022 Initial Diagnosis   Malignant neoplasm of upper-outer quadrant of left breast in female, estrogen receptor positive ([place])   04/22/2022 Surgery   She had left breast lumpectomy on April 25 and pathology showed invasive ductal carcinoma Nottingham grade 2 out of 3, 1.0 cm, DCIS, intermediate grade, margins uninvolved by carcinoma at 3 out of 3 sentinel lymph nodes without any evidence of micrometastasis or micrometastasis.  Prognostics on prior biopsy showed ER +95% ER +100% staining HER2 negative, KI of less than 5%   04/25/2022 Genetic  Testing   Negative genetic testing on the CancerNext-Expanded+RNainsight panel.  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 The report date is April 25, 2022.  The CancerNext-Expanded gene panel offered by [facility] and includes sequencing and rearrangement analysis for the following 77 genes: AIP, ALK, APC*, ATM*, AXIN2, BAP1, BARD1, BLM, BMPR1A, BRCA1*, BRCA2*, BRIP1*, CDC73, CDH1*, CDK4, CDKN1B, CDKN2A, CHEK2*, CTNNA1, DICER1, FANCC, FH, FLCN, GALNT12, KIF1B, LZTR1, MAX, MEN1, MET, MLH1*, MSH2*, MSH3, MSH6*, MUTYH*, NBN, NF1*, NF2, NTHL1, PALB2*, PHOX2B, PMS2*, POT1, PRKAR1A, PTCH1, PTEN*, RAD51C*, RAD51D*, RB1, RECQL, RET, SDHA, SDHAF2, SDHB, SDHC, SDHD, SMAD4, SMARCA4, SMARCB1, SMARCE1, STK11, SUFU, TMEM127, TP53*, TSC1, TSC2, VHL and XRCC2 (sequencing and deletion/duplication); EGFR, EGLN1, HOXB13, KIT, MITF, PDGFRA, POLD1, and POLE (sequencing only); EPCAM and GREM1 (deletion/duplication only). DNA and RNA analyses performed for * genes.    05/07/2022 Oncotype testing   Oncotype recurrence score of 16, distant recurrence risk at 9 years is 4%, group average absolute chemotherapy benefit less than 1%   06/11/2022 - 07/09/2022 Radiation Therapy   Site Technique Total Dose (Gy) Dose per Fx (Gy) Completed Fx Beam Energies  Breast, Left: Breast_L 3D 40.05/40.05 2.67 15/15 10X  Breast, Left: Breast_L_Bst 3D 10/10 2 5/5 6X     07/22/2022 Cancer Staging   Staging form: Breast, AJCC 8th Edition - Clinical: Stage IA (cT1c, cN0, cM0, G2, ER+, PR+, HER2-) - Signed by [name], MD on 07/22/2022 Histologic grading system: 3 grade system   09/22/2022 -  Anti-estrogen oral therapy   Zoladex started on 08/25/2022 given every 4 weeks, Letrozole began 09/22/2022    Interval history  Caitlin [name] is here for a follow up. Since last visit she has been on  letrozole and Zoladex. Since her last visit, she is doing physically very well. However emotionally, she is not doing well. She is very tearful, worried about her  job, needs to think about which words to use like mental fog.  She is also finding it very hard to lose weight and she is very understandably frustrated by this.  She is hoping we can come up with some solution for these problems.  Besides this, she has mild lymphedema and she wears compression p.o. from time to time.  No breast changes reported.  She had a mammogram in March which was unremarkable for malignancy.  Rest of the pertinent 10 point ROS reviewed and negative  MEDICAL HISTORY:  Past Medical History:  Diagnosis Date   Anxiety    Back pain    Breast cancer (HCC)    Edema of both lower extremities    Elevated cholesterol    Endometriosis    Endometriosis    Family history of kidney cancer    Family history of ovarian cancer    Gallbladder problem    High cholesterol    History of IBS    IBS (irritable bowel syndrome)    Infertility, female    Personal history of radiation therapy    Vitamin D deficiency     SURGICAL HISTORY: Past Surgical History:  Procedure Laterality Date   BREAST LUMPECTOMY     BREAST LUMPECTOMY WITH RADIOACTIVE SEED AND SENTINEL LYMPH NODE BIOPSY Left 04/22/2022   Procedure: LEFT BREAST LUMPECTOMY WITH RADIOACTIVE SEED;  Surgeon: Abigail Miyamoto, MD;  Location: [facility] OR;  Service: General;  Laterality: Left;  LMA   CHOLECYSTECTOMY  2018   LAPAROSCOPY     SENTINEL NODE BIOPSY Left 04/22/2022   Procedure: LEFT SENTINEL LYMPH NODE BIOPSY;  Surgeon: Abigail Miyamoto, MD;  Location: [facility] OR;  Service: General;  Laterality: Left;    SOCIAL HISTORY: Social History   Socioeconomic History   Marital status: Married    Spouse name: Thayer Ohm   Number of children: 3   Years of education: Not on file   Highest education level: Not on file  Occupational History   Occupation: Programmer, multimedia  Tobacco Use   Smoking status: Never   Smokeless tobacco: Never  Vaping Use   Vaping Use: Never used  Substance and Sexual Activity   Alcohol use: No   Drug use: No    Sexual activity: Yes    Birth control/protection: None  Other Topics Concern   Not on file  Social History Narrative   Right Handed   Two Story Home   Drinks Caffeine Occasionally    Social Determinants of Health   Financial Resource Strain: Low Risk  (05/01/2022)   Overall Financial Resource Strain (CARDIA)    Difficulty of Paying Living Expenses: Not hard at all  Food Insecurity: No Food Insecurity (05/01/2022)   Hunger Vital Sign    Worried About Running Out of Food in the Last Year: Never true    Ran Out of Food in the Last Year: Never true  Transportation Needs: No Transportation Needs (05/01/2022)   PRAPARE - Administrator, Civil Service (Medical): No    Lack of Transportation (Non-Medical): No  Physical Activity: Not on file  Stress: Not on file  Social Connections: Not on file  Intimate Partner Violence: Not on file    FAMILY HISTORY: Family History  Problem Relation Age of Onset   Thyroid disease Mother    Depression Mother    Anxiety  disorder Mother    Obesity Mother    Hypertension Father    Heart disease Father    Heart disease Maternal Aunt    Diabetes Maternal Aunt    Kidney cancer Maternal Aunt 50   Rheum arthritis Paternal Aunt    Heart disease Maternal Grandmother    COPD Maternal Grandmother    Heart disease Maternal Grandfather    Ovarian cancer Paternal Grandmother        dx in her 48s   Cancer Cousin        unknown cancer in mat first cousin    ALLERGIES:  is allergic to ciprofloxacin and sulfa antibiotics.  MEDICATIONS:  Current Outpatient Medications  Medication Sig Dispense Refill   venlafaxine XR (EFFEXOR-XR) 75 MG 24 hr capsule Take 1 capsule (75 mg total) by mouth daily with breakfast. 30 capsule 1   Cholecalciferol 100 MCG (4000 UT) CAPS 4000 units every 24 hours by oral route.     furosemide (LASIX) 20 MG tablet TAKE ONE TABLET BY MOUTH DAILY 30 tablet 6   goserelin (ZOLADEX) 3.6 MG injection Inject 3.6 mg into the skin  every 28 (twenty-eight) days.     letrozole (FEMARA) 2.5 MG tablet Take 1 tablet (2.5 mg total) by mouth daily. 90 tablet 3   No current facility-administered medications for this visit.    PHYSICAL EXAMINATION: ECOG PERFORMANCE STATUS: 0 - Asymptomatic  Vitals:   06/29/23 0841  BP: (!) 135/95  Pulse: 88  Resp: 18  Temp: (!) 97.1 F (36.2 C)  SpO2: 96%    Constitutional: Denies fevers, chills or abnormal night sweats Neck: No regional adenopathy Bilateral breasts inspected.  Right breast normal to inspection and palpation.  Left breast with some lymphedema noted.  No palpable regional adenopathy or other masses.  No lower extremity swelling  Filed Weights   06/29/23 0841  Weight: 242 lb 1.6 oz (109.8 kg)       LABORATORY DATA:  I have reviewed the data as listed Lab Results  Component Value Date   WBC 8.7 04/15/2022   HGB 14.0 04/15/2022   HCT 42.2 04/15/2022   MCV 90.6 04/15/2022   PLT 282 04/15/2022   Lab Results  Component Value Date   NA 141 02/02/2023   K 4.0 02/02/2023   CL 104 02/02/2023   CO2 30 02/02/2023    RADIOGRAPHIC STUDIES: I have personally reviewed the radiological reports and agreed with the findings in the report.   ASSESSMENT AND PLAN:  Malignant neoplasm of upper-outer quadrant of left breast in female, estrogen receptor positive (HCC) This is a very pleasant 52 year old premenopausal female patient with newly diagnosed left breast invasive ductal carcinoma, ER/PR positive, HER2 negative referred to medical oncology for recommendations. She underwent lumpectomy followed by Oncotype DX testing.  Final pathology showed a 1.2 cm tumor, grade 2, margins uninvolved, 3 out of 3 sentinel lymph nodes negative.  Prognostics on prior biopsy showed ER 95% positive, PR 100% staining, HER2 negative and KI of 5% Oncotype DX showed a recurrence score of 16, distant recurrence risk at 9 years of 4% with antiestrogen therapy.  There appears to be no  significant chemotherapy benefit in this group She completed adjuvant radiation.  She is now on adjuvant Zoladex with letrozole.  We talked about tamoxifen however she has some underlying endometrial issues hence her gynecologist recommended trying not on tamoxifen options.  She is tolerating this combination remarkably well.   Mammogram 2024 neg for malignancy.  She is understandably  dealing with a lot of emotional issues, feeling very tearful, sad and wonders if she has to take some antidepressants.  She states this mental component of menopause is tough on her. States that she is also finding it very difficult to lose weight and is wondering if we can assist with this.  I recommended that we start some antidepressants to help the mood as well as the hot flashes, I will give her a prescription for Effexor 75 mg daily. For weight loss, I understand she is trying her best but she may need the help of weight loss clinic hence referred to medical weight management.  She will otherwise see her gynecologist in winter and return to clinic for follow-up with [place] in summer.  Encouraged some weightbearing exercises.     Total time spent: 30 minutes All questions were answered. The patient knows to call the clinic with any problems, questions or concerns.    Rachel Moulds, MD 06/29/23

## 2023-07-05 ENCOUNTER — Other Ambulatory Visit: Payer: Self-pay | Admitting: Neurology

## 2023-07-06 ENCOUNTER — Ambulatory Visit: Payer: BC Managed Care – PPO | Attending: Radiation Oncology

## 2023-07-06 VITALS — Wt 244.0 lb

## 2023-07-06 DIAGNOSIS — Z483 Aftercare following surgery for neoplasm: Secondary | ICD-10-CM | POA: Insufficient documentation

## 2023-07-06 NOTE — Therapy (Signed)
OUTPATIENT PHYSICAL THERAPY SOZO SCREENING NOTE   Patient Name: Caitlin [name] MRN: [id number] DOB:Nov 01, 1971, 52 y.o., female Today's Date: 07/06/2023  PCP: Dois Davenport, MD REFERRING PROVIDER: Lonie Peak, MD   PT End of Session - 07/06/23 [phone]     Visit Number 18   # unchanged due to screen only   PT Start Time 0824    PT Stop Time 0828    PT Time Calculation (min) 4 min    Activity Tolerance Patient tolerated treatment well    Behavior During Therapy WFL for tasks assessed/performed             Past Medical History:  Diagnosis Date   Anxiety    Back pain    Breast cancer (HCC)    Edema of both lower extremities    Elevated cholesterol    Endometriosis    Endometriosis    Family history of kidney cancer    Family history of ovarian cancer    Gallbladder problem    High cholesterol    History of IBS    IBS (irritable bowel syndrome)    Infertility, female    Personal history of radiation therapy    Vitamin D deficiency    Past Surgical History:  Procedure Laterality Date   BREAST LUMPECTOMY     BREAST LUMPECTOMY WITH RADIOACTIVE SEED AND SENTINEL LYMPH NODE BIOPSY Left 04/22/2022   Procedure: LEFT BREAST LUMPECTOMY WITH RADIOACTIVE SEED;  Surgeon: Abigail Miyamoto, MD;  Location: [facility] OR;  Service: General;  Laterality: Left;  LMA   CHOLECYSTECTOMY  2018   LAPAROSCOPY     SENTINEL NODE BIOPSY Left 04/22/2022   Procedure: LEFT SENTINEL LYMPH NODE BIOPSY;  Surgeon: Abigail Miyamoto, MD;  Location: [facility] OR;  Service: General;  Laterality: Left;   Patient Active Problem List   Diagnosis Date Noted   Elevated blood-pressure reading without diagnosis of hypertension 03/26/2023   Family history of colonic polyps 10/22/2022   Genetic testing 04/29/2022   Family history of ovarian cancer 04/15/2022   Family history of kidney cancer 04/15/2022   Malignant neoplasm of upper-outer quadrant of left breast in female, estrogen receptor positive (HCC) 04/02/2022    Endometriosis 02/20/2022   Vitamin D insufficiency 02/18/2021   Elevated cholesterol 02/18/2021   Class 1 obesity due to excess calories with body mass index (BMI) of 34.0 to 34.9 in adult 02/07/2021   Irritable bowel syndrome 02/07/2021   Benign intracranial hypertension 09/10/2020    REFERRING DIAG: left breast cancer at risk for lymphedema  THERAPY DIAG:  Aftercare following surgery for neoplasm  PERTINENT HISTORY: Patient was diagnosed with left IDC. ER/PR positive, HER2 negative KI67 less than 5%. Pt had left lumpectomy and SLNB on 04/22/22 with Dr. Magnus Ivan. Radiation. No chemotherapy.  Radiation completed 07/09/22. Starting tamoxifen.   PRECAUTIONS: left UE Lymphedema risk, None  SUBJECTIVE: Pt returns for her 3 month L-Dex screen.  "I'm having a bit of a flare up with my breast lymphedema but the pump company sent me a new vest piece because they've been having a error message pop up. So once I start using that again I'll see if it comes back under control. If not I'll let my doctor know I need to come back to therapy."  PAIN:  Are you having pain? No  SOZO SCREENING: Patient was assessed today using the SOZO machine to determine the lymphedema index score. This was compared to her baseline score. It was determined that she is within the recommended range  when compared to her baseline and no further action is needed at this time. She will continue SOZO screenings. These are done every 3 months for 2 years post operatively followed by every 6 months for 2 years, and then annually.   L-DEX FLOWSHEETS - 07/06/23 0800       L-DEX LYMPHEDEMA SCREENING   Measurement Type Unilateral    L-DEX MEASUREMENT EXTREMITY Upper Extremity    POSITION  Standing    DOMINANT SIDE Right    At Risk Side Left    BASELINE SCORE (UNILATERAL) -2.6    L-DEX SCORE (UNILATERAL) -2    VALUE CHANGE (UNILAT) 0.6               Hermenia Bers, PTA 07/06/2023, 8:27 AM

## 2023-07-09 DIAGNOSIS — F4321 Adjustment disorder with depressed mood: Secondary | ICD-10-CM | POA: Diagnosis not present

## 2023-07-27 ENCOUNTER — Inpatient Hospital Stay: Payer: BC Managed Care – PPO

## 2023-07-27 ENCOUNTER — Other Ambulatory Visit: Payer: Self-pay

## 2023-07-27 VITALS — BP 131/102 | HR 87 | Temp 99.0°F | Resp 18

## 2023-07-27 DIAGNOSIS — Z79899 Other long term (current) drug therapy: Secondary | ICD-10-CM | POA: Diagnosis not present

## 2023-07-27 DIAGNOSIS — E559 Vitamin D deficiency, unspecified: Secondary | ICD-10-CM | POA: Diagnosis not present

## 2023-07-27 DIAGNOSIS — E7800 Pure hypercholesterolemia, unspecified: Secondary | ICD-10-CM | POA: Diagnosis not present

## 2023-07-27 DIAGNOSIS — Z5111 Encounter for antineoplastic chemotherapy: Secondary | ICD-10-CM | POA: Diagnosis not present

## 2023-07-27 DIAGNOSIS — R232 Flushing: Secondary | ICD-10-CM | POA: Diagnosis not present

## 2023-07-27 DIAGNOSIS — Z923 Personal history of irradiation: Secondary | ICD-10-CM | POA: Diagnosis not present

## 2023-07-27 DIAGNOSIS — C50412 Malignant neoplasm of upper-outer quadrant of left female breast: Secondary | ICD-10-CM

## 2023-07-27 DIAGNOSIS — K589 Irritable bowel syndrome without diarrhea: Secondary | ICD-10-CM | POA: Diagnosis not present

## 2023-07-27 DIAGNOSIS — Z8041 Family history of malignant neoplasm of ovary: Secondary | ICD-10-CM | POA: Diagnosis not present

## 2023-07-27 DIAGNOSIS — Z9221 Personal history of antineoplastic chemotherapy: Secondary | ICD-10-CM | POA: Diagnosis not present

## 2023-07-27 DIAGNOSIS — Z8051 Family history of malignant neoplasm of kidney: Secondary | ICD-10-CM | POA: Diagnosis not present

## 2023-07-27 DIAGNOSIS — I890 Lymphedema, not elsewhere classified: Secondary | ICD-10-CM | POA: Diagnosis not present

## 2023-07-27 DIAGNOSIS — Z79811 Long term (current) use of aromatase inhibitors: Secondary | ICD-10-CM | POA: Diagnosis not present

## 2023-07-27 MED ORDER — GOSERELIN ACETATE 3.6 MG ~~LOC~~ IMPL
3.6000 mg | DRUG_IMPLANT | SUBCUTANEOUS | Status: DC
  Administered 2023-07-27: 3.6 mg via SUBCUTANEOUS
  Filled 2023-07-27: qty 3.6

## 2023-07-30 DIAGNOSIS — E559 Vitamin D deficiency, unspecified: Secondary | ICD-10-CM | POA: Diagnosis not present

## 2023-07-30 DIAGNOSIS — I10 Essential (primary) hypertension: Secondary | ICD-10-CM | POA: Diagnosis not present

## 2023-07-30 DIAGNOSIS — N959 Unspecified menopausal and perimenopausal disorder: Secondary | ICD-10-CM | POA: Diagnosis not present

## 2023-07-30 DIAGNOSIS — R7301 Impaired fasting glucose: Secondary | ICD-10-CM | POA: Diagnosis not present

## 2023-07-30 DIAGNOSIS — C50912 Malignant neoplasm of unspecified site of left female breast: Secondary | ICD-10-CM | POA: Diagnosis not present

## 2023-07-30 DIAGNOSIS — Z0001 Encounter for general adult medical examination with abnormal findings: Secondary | ICD-10-CM | POA: Diagnosis not present

## 2023-07-30 DIAGNOSIS — E7800 Pure hypercholesterolemia, unspecified: Secondary | ICD-10-CM | POA: Diagnosis not present

## 2023-07-31 LAB — LAB REPORT - SCANNED
A1c: 5.6
EGFR: 108

## 2023-08-03 DIAGNOSIS — F4321 Adjustment disorder with depressed mood: Secondary | ICD-10-CM | POA: Diagnosis not present

## 2023-08-04 DIAGNOSIS — Z0289 Encounter for other administrative examinations: Secondary | ICD-10-CM

## 2023-08-10 NOTE — Progress Notes (Unsigned)
NEUROLOGY FOLLOW UP OFFICE NOTE  KENDRICK CAPLIN [id number]  Assessment/Plan:   Idiopathic intracranial hypertension, stable    Monitor off furosemide.  If symptoms return, she will contact me Follow up with Dr. Dione Booze next March (sooner if she has recurrence of symptoms Follow up with me in 6 months.  Subjective:  Caitlin [name] is a 52 year old right-handed female who follows up for migraines and mild idiopathic intracranial hypertension   UPDATE: Current NSAIDs/analgesics:  ibuprofen Current preventative: none  Repeat eye exam with Dr. Dione Booze in March was negative for papilledema and with stable VF/OCT.  She stopped furosemide 3 months ago.  Now on Zoladex.  Feeling well.  No headaches, no visual obscurations, no pulsatile tinnitus.  She had one migraine with aura and occasional tension type headache but overall doing well.    HISTORY: She has only had one dull headache since starting the topiramate, when it was snowing, and lasted a couple of hours and didn't require any medication.  Pulsating sensation has decreased significantly.  She may hear it once in a while.  If she stands up quickly or bends over, she may get the head pressure briefly.  She saw Dr. Dione Booze again and showed minimal improvement in optic nerve but overall stable     HISTORY: She has longstanding history of migraine presenting with visual aura of wavy lines followed by blind spot preceding headache.  Several years ago, she was started on Mirena and developed bilateral pulsatile tinnitus.  She began having new headaches in the Fall of 2020.  They are severe right occipital shooting pain associated with nausea, photophobia, and phonophobia but not associated with speech disturbance, dizziness, numbness or weakness.  She treats with ibuprofen.  In April 2021, she had a cough that triggered a particularly severe occipital headache.  It was aggravated by change in position, either standing up or bending over.   The severe headache subsided but she had a persistent dull right occipital pressure that lasted 2 weeks.  Other than her typical headaches, she has not had a recurrence of that occipital headache since then.  CT of head on 04/26/2020 was unremarkable.  She had an MRI of brain and internal auditory canals on 06/05/2020 which was normal.  She saw her ophthalmologist, Dr. 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  Visual fields were full.  Repeat exam on 07/05/2020 was stable.  She denies visual obscurations.  She underwent workup for pulsatile tinnitus and idiopathic intracranial hypertension.  TSH from 05/12/2020 was 0.60.   MRA of head and neck on 08/29/2020 was normal.  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  She underwent LP on 09/06/2020 which demonstrated opening pressure of 23 cm water and closing pressure of 17 cm water.    Past medications:  topiramate (hair loss)  PAST MEDICAL HISTORY: Past Medical History:  Diagnosis Date   Anxiety    Back pain    Breast cancer (HCC)    Edema of both lower extremities    Elevated cholesterol    Endometriosis    Endometriosis    Family history of kidney cancer    Family history of ovarian cancer    Gallbladder problem    High cholesterol    History of IBS    IBS (irritable bowel syndrome)    Infertility, female    Personal history of radiation therapy    Vitamin D  deficiency     MEDICATIONS: Current Outpatient Medications on File Prior to Visit  Medication Sig Dispense Refill   Cholecalciferol 100 MCG (4000 UT) CAPS 4000 units every 24 hours by oral route.     furosemide (LASIX) 20 MG tablet TAKE ONE TABLET BY MOUTH DAILY 30 tablet 0   goserelin (ZOLADEX) 3.6 MG injection Inject 3.6 mg into the skin every 28 (twenty-eight) days.     letrozole (FEMARA) 2.5 MG tablet Take 1 tablet (2.5 mg total) by mouth daily. 90 tablet 3   venlafaxine XR  (EFFEXOR-XR) 75 MG 24 hr capsule Take 1 capsule (75 mg total) by mouth daily with breakfast. 30 capsule 1   No current facility-administered medications on file prior to visit.    ALLERGIES: Allergies  Allergen Reactions   Ciprofloxacin Other (See Comments)    "achy joints" "achy joints"    Sulfa Antibiotics Rash    FAMILY HISTORY: Family History  Problem Relation Age of Onset   Thyroid disease Mother    Depression Mother    Anxiety disorder Mother    Obesity Mother    Hypertension Father    Heart disease Father    Heart disease Maternal Aunt    Diabetes Maternal Aunt    Kidney cancer Maternal Aunt 53   Rheum arthritis Paternal Aunt    Heart disease Maternal Grandmother    COPD Maternal Grandmother    Heart disease Maternal Grandfather    Ovarian cancer Paternal Grandmother        dx in her 43s   Cancer Cousin        unknown cancer in mat first cousin      Objective:  Blood pressure 125/78, pulse 100, height 5\' 5"  (1.651 m), weight 244 lb 6.4 oz (110.9 kg), SpO2 97%. General: No acute distress.  Patient appears well-groomed.   Head:  Normocephalic/atraumatic Eyes:  Fundi examined but not visualized Neck: supple, no paraspinal tenderness, full range of motion Heart:  Regular rate and rhythm Neurological Exam: alert and oriented.  Speech fluent and not dysarthric, language intact.  CN II-XII intact. Bulk and tone normal, muscle strength 5/5 throughout.  Sensation to light touch intact.  Deep tendon reflexes 2+ throughout.  Finger to nose testing intact.  Gait normal, Romberg negative.   Shon Millet, DO  CC: Caitlin Coombes, MD

## 2023-08-11 ENCOUNTER — Encounter: Payer: Self-pay | Admitting: Neurology

## 2023-08-11 ENCOUNTER — Ambulatory Visit (INDEPENDENT_AMBULATORY_CARE_PROVIDER_SITE_OTHER): Payer: BC Managed Care – PPO | Admitting: Neurology

## 2023-08-11 VITALS — BP 125/78 | HR 100 | Ht 65.0 in | Wt 244.4 lb

## 2023-08-11 DIAGNOSIS — G932 Benign intracranial hypertension: Secondary | ICD-10-CM

## 2023-08-12 ENCOUNTER — Encounter (INDEPENDENT_AMBULATORY_CARE_PROVIDER_SITE_OTHER): Payer: BC Managed Care – PPO | Admitting: Family Medicine

## 2023-08-20 DIAGNOSIS — F339 Major depressive disorder, recurrent, unspecified: Secondary | ICD-10-CM | POA: Diagnosis not present

## 2023-08-20 DIAGNOSIS — M65331 Trigger finger, right middle finger: Secondary | ICD-10-CM | POA: Diagnosis not present

## 2023-08-20 DIAGNOSIS — F39 Unspecified mood [affective] disorder: Secondary | ICD-10-CM | POA: Diagnosis not present

## 2023-08-20 DIAGNOSIS — Z0001 Encounter for general adult medical examination with abnormal findings: Secondary | ICD-10-CM | POA: Diagnosis not present

## 2023-08-24 ENCOUNTER — Inpatient Hospital Stay: Payer: BC Managed Care – PPO | Attending: Hematology and Oncology

## 2023-08-24 VITALS — BP 121/70 | HR 85 | Resp 18

## 2023-08-24 DIAGNOSIS — Z5111 Encounter for antineoplastic chemotherapy: Secondary | ICD-10-CM | POA: Diagnosis not present

## 2023-08-24 DIAGNOSIS — Z170 Estrogen receptor positive status [ER+]: Secondary | ICD-10-CM | POA: Insufficient documentation

## 2023-08-24 DIAGNOSIS — Z79811 Long term (current) use of aromatase inhibitors: Secondary | ICD-10-CM | POA: Insufficient documentation

## 2023-08-24 DIAGNOSIS — C50412 Malignant neoplasm of upper-outer quadrant of left female breast: Secondary | ICD-10-CM | POA: Insufficient documentation

## 2023-08-24 MED ORDER — GOSERELIN ACETATE 3.6 MG ~~LOC~~ IMPL
3.6000 mg | DRUG_IMPLANT | SUBCUTANEOUS | Status: DC
  Administered 2023-08-24: 3.6 mg via SUBCUTANEOUS
  Filled 2023-08-24: qty 3.6

## 2023-08-25 DIAGNOSIS — M65331 Trigger finger, right middle finger: Secondary | ICD-10-CM | POA: Diagnosis not present

## 2023-08-26 ENCOUNTER — Ambulatory Visit (INDEPENDENT_AMBULATORY_CARE_PROVIDER_SITE_OTHER): Payer: BC Managed Care – PPO | Admitting: Family Medicine

## 2023-08-26 ENCOUNTER — Ambulatory Visit (HOSPITAL_COMMUNITY)
Admission: RE | Admit: 2023-08-26 | Discharge: 2023-08-26 | Disposition: A | Discharge status: Home / Self Care | Payer: BC Managed Care – PPO | Source: Ambulatory Visit | Attending: Family Medicine | Admitting: Family Medicine

## 2023-08-26 ENCOUNTER — Encounter: Payer: Self-pay | Admitting: Family Medicine

## 2023-08-26 ENCOUNTER — Other Ambulatory Visit: Payer: Self-pay

## 2023-08-26 VITALS — BP 130/85 | HR 80 | Temp 98.3°F | Ht 65.0 in | Wt 241.0 lb

## 2023-08-26 DIAGNOSIS — R0602 Shortness of breath: Secondary | ICD-10-CM

## 2023-08-26 DIAGNOSIS — Z170 Estrogen receptor positive status [ER+]: Secondary | ICD-10-CM | POA: Diagnosis not present

## 2023-08-26 DIAGNOSIS — F32A Depression, unspecified: Secondary | ICD-10-CM | POA: Insufficient documentation

## 2023-08-26 DIAGNOSIS — M722 Plantar fascial fibromatosis: Secondary | ICD-10-CM

## 2023-08-26 DIAGNOSIS — R5383 Other fatigue: Secondary | ICD-10-CM | POA: Diagnosis not present

## 2023-08-26 DIAGNOSIS — G932 Benign intracranial hypertension: Secondary | ICD-10-CM

## 2023-08-26 DIAGNOSIS — Z79811 Long term (current) use of aromatase inhibitors: Secondary | ICD-10-CM | POA: Diagnosis not present

## 2023-08-26 DIAGNOSIS — I10 Essential (primary) hypertension: Secondary | ICD-10-CM

## 2023-08-26 DIAGNOSIS — E559 Vitamin D deficiency, unspecified: Secondary | ICD-10-CM

## 2023-08-26 DIAGNOSIS — Z6841 Body Mass Index (BMI) 40.0 and over, adult: Secondary | ICD-10-CM

## 2023-08-26 DIAGNOSIS — Z853 Personal history of malignant neoplasm of breast: Secondary | ICD-10-CM

## 2023-08-26 DIAGNOSIS — C50412 Malignant neoplasm of upper-outer quadrant of left female breast: Secondary | ICD-10-CM | POA: Diagnosis not present

## 2023-08-26 DIAGNOSIS — F3289 Other specified depressive episodes: Secondary | ICD-10-CM

## 2023-08-26 DIAGNOSIS — R7303 Prediabetes: Secondary | ICD-10-CM | POA: Diagnosis not present

## 2023-08-26 DIAGNOSIS — Z5111 Encounter for antineoplastic chemotherapy: Secondary | ICD-10-CM | POA: Diagnosis not present

## 2023-08-26 DIAGNOSIS — E7849 Other hyperlipidemia: Secondary | ICD-10-CM

## 2023-08-26 DIAGNOSIS — Z1331 Encounter for screening for depression: Secondary | ICD-10-CM | POA: Insufficient documentation

## 2023-08-27 LAB — INSULIN, RANDOM: INSULIN: 24.4 u[IU]/mL (ref 2.6–24.9)

## 2023-08-27 LAB — VITAMIN B12: Vitamin B-12: 553 pg/mL (ref 232–1245)

## 2023-08-27 LAB — FOLATE: Folate: 10.8 ng/mL (ref >3.0)

## 2023-08-27 NOTE — Progress Notes (Signed)
Chief Complaint:   OBESITY Caitlin [name] (MR# [id number]) is a 52 y.o. female who presents for evaluation and treatment of obesity and related comorbidities. Current BMI is Body mass index is 40.1 kg/m. Caitlin [name] has been struggling with her weight for many years and has been unsuccessful in either losing weight, maintaining weight loss, or reaching her healthy weight goal.  Caitlin [name] is currently in the action stage of change and ready to dedicate time achieving and maintaining a healthier weight. Caitlin [name] is interested in becoming our patient and working on intensive lifestyle modifications including (but not limited to) diet and exercise for weight loss.  Patient works as an Programmer, multimedia.  She lives with her husband and has 3 children (oldest in college).  Her walking has been limited due to plantar fasciitis.  She gained weight after her cancer diagnosis and her father's death.  She was 180 pounds prepandemic and felt well.  Caitlin [name]'s habits were reviewed today and are as follows: Her family eats meals together, she thinks her family will eat healthier with her, she struggles with family and or coworkers weight loss sabotage, her desired weight loss is 81 lbs, she has been heavy most of her life, she started gaining weight during and post COVID pandemic 2019, her heaviest weight ever was 245 pounds, she has significant food cravings issues, she skips meals frequently, she is frequently drinking liquids with calories, she frequently makes poor food choices, she frequently eats larger portions than normal, and she struggles with emotional eating.  Depression Screen Caitlin [name]'s Food and Mood (modified PHQ-9) score was 21.  Subjective:   1. Other fatigue Caitlin [name] admits to daytime somnolence and denies waking up still tired. Patient has a history of symptoms of daytime fatigue. Caitlin [name] generally gets 7 or 8 hours of sleep per night, and states that she has generally restful sleep. Snoring is  present. Apneic episodes are not present. Epworth Sleepiness Score is 10.  EKG-normal sinus rhythm at 87 bpm without ischemia.  2. SOBOE (shortness of breath on exertion) Caitlin [name] notes increasing shortness of breath with exercising and seems to be worsening over time with weight gain. She notes getting out of breath sooner with activity than she used to. This has not gotten worse recently. Caitlin [name] denies shortness of breath at rest or orthopnea.  3. History of breast cancer Patient is on letrozole 2.5 mg once daily.  She has had a left breast lumpectomy and radiation.  She is managed by Dr. Al Pimple.  She notes hormonal changes and mood changes have been worse.  4. Benign intracranial hypertension Patient is managed by Dr. Everlena Cooper.  She has had pulsate tinnitus and averaging headaches once every 3 months.  5. Essential hypertension Patient has a family history of MDD.  She is on Hyzaar 12.5 mg 1.5 tablets once daily.  She has some fatigue, but denies chest pain or shortness of breath.  6. Left foot Plantar fasciitis Patient wears insoles and she is doing some stretches.  Her foot pain has limited her walking time.  7. Pre-diabetes Patient's last A1c was 5.9 on 07/31/2023.  She has a family history of type 2 diabetes mellitus. No GMM.   8. Vitamin D deficiency Patient's last vitamin D level was 27 on 07/31/2023.  She is taking vitamin D weekly prescription.  9. Other hyperlipidemia Patient's last LDL was 166, triglycerides 170, and total cholesterol 250 on 07/31/2023.  10. Other depression Patient is on Wellbutrin XL 150 mg once daily.  She has seen a reduction in appetite.  Her bariatric PHQ-9 score is 21.  She notes her mood swings have improved, and she is sleeping well.  Assessment/Plan:   1. Other fatigue Caitlin [name] does feel that her weight is causing her energy to be lower than it should be. Fatigue may be related to obesity, depression or many other causes. Labs will be ordered, and in  the meanwhile, Caitlin [name] will focus on self care including making healthy food choices, increasing physical activity and focusing on stress reduction.  - EKG 12-Lead - Insulin, random - Folate - Vitamin B12  2. SOBOE (shortness of breath on exertion) Caitlin [name] does feel that she gets out of breath more easily that she used to when she exercises. Caitlin [name]'s shortness of breath appears to be obesity related and exercise induced. She has agreed to work on weight loss and gradually increase exercise to treat her exercise induced shortness of breath. Will continue to monitor closely.  3. History of breast cancer We will follow-up at patient's next visit.  4. Benign intracranial hypertension Patient will begin her active plan for weight reduction.  5. Essential hypertension Look for improvements in hypertension with weight loss.  6. Left foot Plantar fasciitis Patient is to consider alternative exercise options.  She will begin her active plan for weight loss.  7. Pre-diabetes We will check labs today.  Patient will begin her prescribed meal plan.  - Insulin, random  8. Vitamin D deficiency Patient will continue on vitamin D.  9. Other hyperlipidemia Patient will begin her prescribed meal plan.  10. Other depression Patient will continue Wellbutrin XL once daily.  11. Depression screen Caitlin [name] had a positive depression screening. Depression is commonly associated with obesity and often results in emotional eating behaviors. We will monitor this closely and work on CBT to help improve the non-hunger eating patterns. Referral to Psychology may be required if no improvement is seen as she continues in our clinic.  12. BMI 40.0-44.9, adult (HCC)  13. Morbid obesity (HCC) Caitlin [name] is currently in the action stage of change and her goal is to continue with weight loss efforts. I recommend Caitlin [name] begin the structured treatment plan as follows:  She has agreed to the Category 2 Plan  with 300 snack calories daily.  100-calorie snack list was given.   Exercise goals: All adults should avoid inactivity. Some physical activity is better than none, and adults who participate in any amount of physical activity gain some health benefits.   Behavioral modification strategies: increasing lean protein intake, increasing vegetables, increasing water intake, increasing high fiber foods, decreasing eating out, no skipping meals, meal planning and cooking strategies, keeping healthy foods in the home, better snacking choices, and planning for success.  She was informed of the importance of frequent follow-up visits to maximize her success with intensive lifestyle modifications for her multiple health conditions. She was informed we would discuss her lab results at her next visit unless there is a critical issue that needs to be addressed sooner. Caitlin [name] agreed to keep her next visit at the agreed upon time to discuss these results.  Objective:   Blood pressure 130/85, pulse 80, temperature 98.3 F (36.8 C), height 5\' 5"  (1.651 m), weight 241 lb (109.3 kg), SpO2 97%. Body mass index is 40.1 kg/m.  EKG: Normal sinus rhythm, rate 87 BPM.  Indirect Calorimeter completed today shows a VO2 of 268 and a REE of 1843.  Her calculated basal metabolic rate is 1610 thus her basal  metabolic rate is better than expected.  General: Cooperative, alert, well developed, in no acute distress. HEENT: Conjunctivae and lids unremarkable. Cardiovascular: Regular rhythm.  Lungs: Normal work of breathing. Neurologic: No focal deficits.   Lab Results  Component Value Date   CREATININE 0.70 02/02/2023   BUN 18 02/02/2023   NA 141 02/02/2023   K 4.0 02/02/2023   CL 104 02/02/2023   CO2 30 02/02/2023   Lab Results  Component Value Date   ALT 20 12/03/2010   AST 27 12/03/2010   ALKPHOS 143 (H) 12/03/2010   BILITOT 0.6 12/03/2010   Lab Results  Component Value Date   HGBA1C 5.5 02/04/2021    Lab Results  Component Value Date   INSULIN 24.4 08/26/2023   INSULIN 9.1 02/04/2021   No results found for: "TSH" Lab Results  Component Value Date   CHOL 232 (H) 02/04/2021   HDL 51 02/04/2021   LDLCALC 162 (H) 02/04/2021   TRIG 105 02/04/2021   Lab Results  Component Value Date   WBC 8.7 04/15/2022   HGB 14.0 04/15/2022   HCT 42.2 04/15/2022   MCV 90.6 04/15/2022   PLT 282 04/15/2022   No results found for: "IRON", "TIBC", "FERRITIN"  Attestation Statements:   Reviewed by clinician on day of visit: allergies, medications, problem list, medical history, surgical history, family history, social history, and previous encounter notes.  Time spent on visit including pre-visit chart review and post-visit charting and care was 40 minutes.   Trude Mcburney, am acting as transcriptionist for [name], DO.  I have reviewed the above documentation for accuracy and completeness, and I agree with the above. [name] DO

## 2023-09-03 DIAGNOSIS — F4321 Adjustment disorder with depressed mood: Secondary | ICD-10-CM | POA: Diagnosis not present

## 2023-09-09 ENCOUNTER — Ambulatory Visit (INDEPENDENT_AMBULATORY_CARE_PROVIDER_SITE_OTHER): Payer: BC Managed Care – PPO | Admitting: Family Medicine

## 2023-09-09 ENCOUNTER — Telehealth: Payer: Self-pay | Admitting: *Deleted

## 2023-09-09 ENCOUNTER — Encounter: Payer: Self-pay | Admitting: Family Medicine

## 2023-09-09 VITALS — BP 120/84 | HR 84 | Temp 98.6°F | Ht 65.0 in | Wt 236.0 lb

## 2023-09-09 DIAGNOSIS — Z6839 Body mass index (BMI) 39.0-39.9, adult: Secondary | ICD-10-CM

## 2023-09-09 DIAGNOSIS — E88819 Insulin resistance, unspecified: Secondary | ICD-10-CM | POA: Insufficient documentation

## 2023-09-09 DIAGNOSIS — R632 Polyphagia: Secondary | ICD-10-CM | POA: Diagnosis not present

## 2023-09-09 MED ORDER — PEN NEEDLES 32G X 4 MM MISC: 0 refills | Status: DC

## 2023-09-09 MED ORDER — SAXENDA 18 MG/3ML ~~LOC~~ SOPN: 0.6000 mg | PEN_INJECTOR | Freq: Every day | SUBCUTANEOUS | 0 refills | Status: DC

## 2023-09-09 NOTE — Telephone Encounter (Signed)
Prior authorization done via cover my meds for patients Saxenda. Waiting on determination.

## 2023-09-09 NOTE — Assessment & Plan Note (Signed)
Reviewed lab results with patient Fasting insulin high at 24.4 Explained pathophysiology of IR and it's effect on appetite and body fat deposition  Continue to work on limiting added sugar and refined carbohydrates Add in resistance training 3 x a week Consider use of metformin

## 2023-09-09 NOTE — Assessment & Plan Note (Signed)
She is still dealing with some periods of increased appetite on prescribed meal plan She is a good candidate for use of Saxenda for the treatment of polyphagia and class II obesity.  Patient denies a personal or family history of pancreatitis, medullary thyroid carcinoma or multiple endocrine neoplasia type II. Recommend reviewing pen training video online.  Begin Saxenda at 0.6 mg once daily injection Reviewed MOA and potential adverse SE She will use Saxenda along with a calorie- restricted high protein diet with regular physical activity

## 2023-09-09 NOTE — Progress Notes (Signed)
Office: [phone]  /  Fax: [phone]  WEIGHT SUMMARY AND BIOMETRICS  Starting Date: 08/26/23  Starting Weight: 241lb   Weight Lost Since Last Visit: 5lb   Vitals Temp: 98.6 F (37 C) BP: 120/84 Pulse Rate: 84 SpO2: 98 %   Body Composition  Body Fat %: 47.3 % Fat Mass (lbs): 111.8 lbs Muscle Mass (lbs): 118.4 lbs Total Body Water (lbs): 85.6 lbs Visceral Fat Rating : 14     HPI  Chief Complaint: OBESITY  Caitlin [name] is here to discuss her progress with her obesity treatment plan. She is on the the Category 2 Plan and states she is following her eating plan approximately 80-85 % of the time. She states she is exercising 0 minutes 0 times per week.   Interval History:  Since last office visit she is down 5 lb She is getting in all of her meals on plan, getting sick of eggs for breakfast She is less hungry and having less cravings She has a good support system at work and at home She is getting in fruits and veggies  She is working on hydrating well Walking is limited due to plantar fasciitis  Pharmacotherapy: none  PHYSICAL EXAM:  Blood pressure 120/84, pulse 84, temperature 98.6 F (37 C), height 5\' 5"  (1.651 m), weight 236 lb (107 kg), SpO2 98%. Body mass index is 39.27 kg/m.  General: She is overweight, cooperative, alert, well developed, and in no acute distress. PSYCH: Has normal mood, affect and thought process.   Lungs: Normal breathing effort, no conversational dyspnea.   ASSESSMENT AND PLAN  TREATMENT PLAN FOR OBESITY:  Recommended Dietary Goals  [name] is currently in the action stage of change. As such, her goal is to continue weight management plan. She has agreed to the Category 2 Plan. - log daily intake on the MyFitnessPal ap; aim for 1300-1500 cal/ day to include 90-100 g of protein intake daily  Behavioral Intervention  We discussed the following Behavioral Modification Strategies today: increasing lean protein intake,  decreasing simple carbohydrates , increasing vegetables, increasing lower glycemic fruits, increasing fiber rich foods, avoiding skipping meals, increasing water intake, work on meal planning and preparation, keeping healthy foods at home, continue to practice mindfulness when eating, and planning for success.  Additional resources provided today: NA  Recommended Physical Activity Goals  Ellesyn has been advised to work up to 150 minutes of moderate intensity aerobic activity a week and strengthening exercises 2-3 times per week for cardiovascular health, weight loss maintenance and preservation of muscle mass.   She has agreed to [employer] strengthening exercises with a goal of 2-3 sessions a week   Pharmacotherapy changes for the treatment of obesity: Saxenda 0.6 mg once daily injection  ASSOCIATED CONDITIONS ADDRESSED TODAY  Insulin resistance Assessment & Plan: Reviewed lab results with patient Fasting insulin high at 24.4 Explained pathophysiology of IR and it's effect on appetite and body fat deposition  Continue to work on limiting added sugar and refined carbohydrates Add in resistance training 3 x a week Consider use of metformin   Polyphagia Assessment & Plan: She is still dealing with some periods of increased appetite on prescribed meal plan She is a good candidate for use of Saxenda for the treatment of polyphagia and class II obesity.  Patient denies a personal or family history of pancreatitis, medullary thyroid carcinoma or multiple endocrine neoplasia type II. Recommend reviewing pen training video online.  Begin Saxenda at 0.6 mg once daily injection Reviewed MOA and potential  adverse SE She will use Saxenda along with a calorie- restricted high protein diet with regular physical activity  Orders: -     Saxenda; Inject 0.6 mg into the skin daily.  Dispense: 15 mL; Refill: 0 -     Pen Needles; Use once daily as directed  Dispense: 90 each; Refill: 0  Morbid  obesity (HCC) with starting BMI 40  BMI 39.0-39.9,adult      She was informed of the importance of frequent follow up visits to maximize her success with intensive lifestyle modifications for her multiple health conditions.   ATTESTASTION STATEMENTS:  Reviewed by clinician on day of visit: allergies, medications, problem list, medical history, surgical history, family history, social history, and previous encounter notes pertinent to obesity diagnosis.   I have personally spent 30 minutes total time today in preparation, patient care, nutritional counseling and documentation for this visit, including the following: review of clinical lab tests; review of medical tests/procedures/services.      [name], DO DABFM, DABOM [facility] [address] [place], [place] [address] [phone]

## 2023-09-16 NOTE — Telephone Encounter (Signed)
Prior authorization denied for patients Saxenda.  Message from plan: Denied. This health benefit plan does not cover the following services, supplies, drugs or charges: Any treatment or regimen, medical or surgical, for the purpose of reducing or controlling the weight of the member, or for the treatment of obesity, except for surgical treatment of morbid obesity, or as specifically covered by this health benefit plan.

## 2023-09-21 ENCOUNTER — Inpatient Hospital Stay: Payer: BC Managed Care – PPO | Attending: Hematology and Oncology

## 2023-09-21 VITALS — BP 126/84 | HR 83 | Resp 16

## 2023-09-21 DIAGNOSIS — C50412 Malignant neoplasm of upper-outer quadrant of left female breast: Secondary | ICD-10-CM | POA: Insufficient documentation

## 2023-09-21 DIAGNOSIS — Z5111 Encounter for antineoplastic chemotherapy: Secondary | ICD-10-CM | POA: Diagnosis not present

## 2023-09-21 DIAGNOSIS — Z170 Estrogen receptor positive status [ER+]: Secondary | ICD-10-CM | POA: Diagnosis not present

## 2023-09-21 MED ORDER — GOSERELIN ACETATE 3.6 MG ~~LOC~~ IMPL
3.6000 mg | DRUG_IMPLANT | SUBCUTANEOUS | Status: DC
  Administered 2023-09-21: 3.6 mg via SUBCUTANEOUS
  Filled 2023-09-21: qty 3.6

## 2023-09-24 DIAGNOSIS — F4321 Adjustment disorder with depressed mood: Secondary | ICD-10-CM | POA: Diagnosis not present

## 2023-09-30 ENCOUNTER — Ambulatory Visit (INDEPENDENT_AMBULATORY_CARE_PROVIDER_SITE_OTHER): Payer: BC Managed Care – PPO | Admitting: Family Medicine

## 2023-09-30 ENCOUNTER — Encounter: Payer: Self-pay | Admitting: Family Medicine

## 2023-09-30 VITALS — BP 116/81 | HR 69 | Temp 98.1°F | Ht 65.0 in | Wt 233.0 lb

## 2023-09-30 DIAGNOSIS — Z6838 Body mass index (BMI) 38.0-38.9, adult: Secondary | ICD-10-CM

## 2023-09-30 DIAGNOSIS — R632 Polyphagia: Secondary | ICD-10-CM | POA: Diagnosis not present

## 2023-09-30 DIAGNOSIS — E88819 Insulin resistance, unspecified: Secondary | ICD-10-CM | POA: Diagnosis not present

## 2023-09-30 DIAGNOSIS — E66812 Obesity, class 2: Secondary | ICD-10-CM | POA: Diagnosis not present

## 2023-09-30 MED ORDER — TIRZEPATIDE-WEIGHT MANAGEMENT 2.5 MG/0.5ML ~~LOC~~ SOLN: 2.5000 mg | SUBCUTANEOUS | 0 refills | Status: DC

## 2023-09-30 NOTE — Assessment & Plan Note (Signed)
Patient lacked insurance coverage for Saxenda.  She has been working on eating on a schedule including lean protein and fiber as directed on her meal plan.  She is still struggling with hunger while working from home.  She has successfully lost 8 pounds in the past 1 month of medically supervised weight management.  Avoid use of stimulant type antiobesity medication given her benign intracranial hypertension. She is a good candidate for use of Zepbound 2.5 mg single dose vials via mail order pharmacy as cash pay for $99 each Aim for 90+ grams of dietary protein intake daily Increase intake of water

## 2023-09-30 NOTE — Progress Notes (Signed)
Office: [phone]  /  Fax: [phone]  WEIGHT SUMMARY AND BIOMETRICS  Starting Date: 08/26/23  Starting Weight: 241lb   Weight Lost Since Last Visit: 3lb   Vitals Temp: 98.1 F (36.7 C) BP: 116/81 Pulse Rate: 69 SpO2: 98 %   Body Composition  Body Fat %: 46.4 % Fat Mass (lbs): 108.2 lbs Muscle Mass (lbs): 118.4 lbs Total Body Water (lbs): 83.8 lbs Visceral Fat Rating : 13   HPI  Chief Complaint: OBESITY  Caitlin [name] is here to discuss her progress with her obesity treatment plan. She is on the the Category 2 Plan and states she is following her eating plan approximately 75 % of the time. She states she is exercising 0 minutes 0 times per week.   Interval History:  Since last office visit she is down 3 lb She has a net weight of 8 lb in the past month of medically supervised weight management She is getting in all 3 meals and is feeling adequately full She has rotated in alternative breakfast options as reviewed She is struggling to get in enough water during the day and struggles with over snacking while working from home She is down 0 lb of muscle mass and is down 3.4 lb of body fat in the past 3 weeks She did not have coverage for Saxenda She plans to start using free weights at home and increase her walking time as her PF improves  Pharmacotherapy: none  PHYSICAL EXAM:  Blood pressure 116/81, pulse 69, temperature 98.1 F (36.7 C), height 5\' 5"  (1.651 m), weight 233 lb (105.7 kg), SpO2 98%. Body mass index is 38.77 kg/m.  General: She is overweight, cooperative, alert, well developed, and in no acute distress. PSYCH: Has normal mood, affect and thought process.   Lungs: Normal breathing effort, no conversational dyspnea.   ASSESSMENT AND PLAN  TREATMENT PLAN FOR OBESITY:  Recommended Dietary Goals  Caitlin [name] is currently in the action stage of change. As such, her goal is to continue weight management plan. She has agreed to the Category 2  Plan.  Behavioral Intervention  We discussed the following Behavioral Modification Strategies today: increasing lean protein intake, decreasing simple carbohydrates , increasing vegetables, increasing lower glycemic fruits, increasing fiber rich foods, avoiding skipping meals, increasing water intake, work on meal planning and preparation, keeping healthy foods at home, continue to practice mindfulness when eating, and planning for success.  Additional resources provided today: NA  Recommended Physical Activity Goals  Caitlin [name] has been advised to work up to 150 minutes of moderate intensity aerobic activity a week and strengthening exercises 2-3 times per week for cardiovascular health, weight loss maintenance and preservation of muscle mass.   She has agreed to Think about ways to increase daily physical activity and overcoming barriers to exercise  Pharmacotherapy changes for the treatment of obesity: Begin Zepbound 2.5 mg single dose vials via [facility] mail order pharmacy once weekly injection Will use Zepbound in combination with a reduced calorie prescribed diet, regular exercise Patient denies a personal or family history of pancreatitis, medullary thyroid carcinoma or multiple endocrine neoplasia type II. Recommend reviewing pen training video online.   ASSOCIATED CONDITIONS ADDRESSED TODAY  Polyphagia Assessment & Plan: Patient lacked insurance coverage for Saxenda.  She has been working on eating on a schedule including lean protein and fiber as directed on her meal plan.  She is still struggling with hunger while working from home.  She has successfully lost 8 pounds in the past 1  month of medically supervised weight management.  Avoid use of stimulant type antiobesity medication given her benign intracranial hypertension. She is a good candidate for use of Zepbound 2.5 mg single dose vials via mail order pharmacy as cash pay for $99 each Aim for 90+ grams of dietary protein  intake daily Increase intake of water  Orders: -     Tirzepatide-Weight Management; Inject 2.5 mg into the skin once a week.  Dispense: 2 mL; Refill: 0  Morbid obesity (HCC) with starting BMI 40  Class 2 severe obesity due to excess calories with serious comorbidity and body mass index (BMI) of 38.0 to 38.9 in adult [facility])  Insulin resistance Assessment & Plan: Last fasting insulin elevated at 24.4.  She has been working on reducing her intake of added sugar and refined carbohydrates.  She has room for improvement to increase walking time to help with insulin sensitivity.  Plantar fasciitis has been a barrier to her walking time.  Discussed adding in short walks throughout the day, wearing proper sneakers with arch support. Avoid food and drink with over 8 g of added sugar per serving.       She was informed of the importance of frequent follow up visits to maximize her success with intensive lifestyle modifications for her multiple health conditions.   ATTESTASTION STATEMENTS:  Reviewed by clinician on day of visit: allergies, medications, problem list, medical history, surgical history, family history, social history, and previous encounter notes pertinent to obesity diagnosis.   I have personally spent 30 minutes total time today in preparation, patient care, nutritional counseling and documentation for this visit, including the following: review of clinical lab tests; review of medical tests/procedures/services.      [name], DO DABFM, DABOM [facility] [address] [place], [place] [address] [phone]

## 2023-09-30 NOTE — Assessment & Plan Note (Signed)
Last fasting insulin elevated at 24.4.  She has been working on reducing her intake of added sugar and refined carbohydrates.  She has room for improvement to increase walking time to help with insulin sensitivity.  Plantar fasciitis has been a barrier to her walking time.  Discussed adding in short walks throughout the day, wearing proper sneakers with arch support. Avoid food and drink with over 8 g of added sugar per serving.

## 2023-10-01 ENCOUNTER — Other Ambulatory Visit: Payer: Self-pay | Admitting: Hematology and Oncology

## 2023-10-01 DIAGNOSIS — E7800 Pure hypercholesterolemia, unspecified: Secondary | ICD-10-CM | POA: Diagnosis not present

## 2023-10-01 DIAGNOSIS — I10 Essential (primary) hypertension: Secondary | ICD-10-CM | POA: Diagnosis not present

## 2023-10-01 DIAGNOSIS — Z0001 Encounter for general adult medical examination with abnormal findings: Secondary | ICD-10-CM | POA: Diagnosis not present

## 2023-10-01 DIAGNOSIS — M5431 Sciatica, right side: Secondary | ICD-10-CM | POA: Diagnosis not present

## 2023-10-01 DIAGNOSIS — F339 Major depressive disorder, recurrent, unspecified: Secondary | ICD-10-CM | POA: Diagnosis not present

## 2023-10-01 DIAGNOSIS — Z23 Encounter for immunization: Secondary | ICD-10-CM | POA: Diagnosis not present

## 2023-10-12 ENCOUNTER — Ambulatory Visit: Payer: BC Managed Care – PPO | Attending: Radiation Oncology

## 2023-10-12 VITALS — Wt 234.5 lb

## 2023-10-12 DIAGNOSIS — Z483 Aftercare following surgery for neoplasm: Secondary | ICD-10-CM | POA: Insufficient documentation

## 2023-10-12 NOTE — Therapy (Signed)
OUTPATIENT PHYSICAL THERAPY SOZO SCREENING NOTE   Patient Name: Caitlin [name] MRN: [id number] DOB:22-Jan-1971, 52 y.o., female Today's Date: 10/12/2023  PCP: Dois Davenport, MD REFERRING PROVIDER: Lonie Peak, MD   PT End of Session - 10/12/23 0836     Visit Number 18   # unchanged due to screen only   PT Start Time 0834    PT Stop Time 0838    PT Time Calculation (min) 4 min    Activity Tolerance Patient tolerated treatment well    Behavior During Therapy WFL for tasks assessed/performed             Past Medical History:  Diagnosis Date   Anxiety    Back pain    Breast cancer (HCC)    Depression    Edema of both lower extremities    Elevated cholesterol    Endometriosis    Endometriosis    Family history of kidney cancer    Family history of ovarian cancer    Fatty liver    Gallbladder problem    High blood pressure    High cholesterol    History of IBS    IBS (irritable bowel syndrome)    Infertility, female    Joint pain    Personal history of radiation therapy    Vitamin D deficiency    Past Surgical History:  Procedure Laterality Date   BREAST LUMPECTOMY     BREAST LUMPECTOMY WITH RADIOACTIVE SEED AND SENTINEL LYMPH NODE BIOPSY Left 04/22/2022   Procedure: LEFT BREAST LUMPECTOMY WITH RADIOACTIVE SEED;  Surgeon: Abigail Miyamoto, MD;  Location: [facility] OR;  Service: General;  Laterality: Left;  LMA   CHOLECYSTECTOMY  2018   LAPAROSCOPY     SENTINEL NODE BIOPSY Left 04/22/2022   Procedure: LEFT SENTINEL LYMPH NODE BIOPSY;  Surgeon: Abigail Miyamoto, MD;  Location: [facility] OR;  Service: General;  Laterality: Left;   Patient Active Problem List   Diagnosis Date Noted   Insulin resistance 09/09/2023   Polyphagia 09/09/2023   Other fatigue 08/26/2023   SOBOE (shortness of breath on exertion) 08/26/2023   History of breast cancer 08/26/2023   Essential hypertension 08/26/2023   Plantar fasciitis 08/26/2023   Depression 08/26/2023   Depression screen  08/26/2023   Pre-diabetes 08/26/2023   Vitamin D deficiency 08/26/2023   Other hyperlipidemia 08/26/2023   BMI 40.0-44.9, adult (HCC) 08/26/2023   Morbid obesity (HCC) with starting BMI 40 08/26/2023   Elevated blood-pressure reading without diagnosis of hypertension 03/26/2023   Family history of colonic polyps 10/22/2022   Genetic testing 04/29/2022   Family history of ovarian cancer 04/15/2022   Family history of kidney cancer 04/15/2022   Malignant neoplasm of upper-outer quadrant of left breast in female, estrogen receptor positive (HCC) 04/02/2022   Endometriosis 02/20/2022   Vitamin D insufficiency 02/18/2021   Elevated cholesterol 02/18/2021   Class 1 obesity due to excess calories with body mass index (BMI) of 34.0 to 34.9 in adult 02/07/2021   Irritable bowel syndrome 02/07/2021   Benign intracranial hypertension 09/10/2020    REFERRING DIAG: left breast cancer at risk for lymphedema  THERAPY DIAG:  Aftercare following surgery for neoplasm  PERTINENT HISTORY: Patient was diagnosed with left IDC. ER/PR positive, HER2 negative KI67 less than 5%. Pt had left lumpectomy and SLNB on 04/22/22 with Dr. Magnus Ivan. Radiation. No chemotherapy.  Radiation completed 07/09/22. Starting tamoxifen.   PRECAUTIONS: left UE Lymphedema risk, None  SUBJECTIVE: Pt returns for her 3 month L-Dex screen.  PAIN:  Are you having pain? No  SOZO SCREENING: Patient was assessed today using the SOZO machine to determine the lymphedema index score. This was compared to her baseline score. It was determined that she is within the recommended range when compared to her baseline and no further action is needed at this time. She will continue SOZO screenings. These are done every 3 months for 2 years post operatively followed by every 6 months for 2 years, and then annually.   L-DEX FLOWSHEETS - 10/12/23 0800       L-DEX LYMPHEDEMA SCREENING   Measurement Type Unilateral    L-DEX MEASUREMENT EXTREMITY  Upper Extremity    POSITION  Standing    DOMINANT SIDE Right    At Risk Side Left    BASELINE SCORE (UNILATERAL) -2.6    L-DEX SCORE (UNILATERAL) -2.4    VALUE CHANGE (UNILAT) 0.2               Hermenia Bers, PTA 10/12/2023, 8:37 AM

## 2023-10-15 DIAGNOSIS — F4321 Adjustment disorder with depressed mood: Secondary | ICD-10-CM | POA: Diagnosis not present

## 2023-10-18 ENCOUNTER — Telehealth: Payer: Self-pay | Admitting: Family Medicine

## 2023-10-18 DIAGNOSIS — R632 Polyphagia: Secondary | ICD-10-CM

## 2023-10-18 MED ORDER — TIRZEPATIDE-WEIGHT MANAGEMENT 2.5 MG/0.5ML ~~LOC~~ SOLN: 2.5000 mg | SUBCUTANEOUS | 0 refills | Status: DC

## 2023-10-18 NOTE — Telephone Encounter (Signed)
Resent Zepbound RX

## 2023-10-19 ENCOUNTER — Inpatient Hospital Stay: Payer: BC Managed Care – PPO | Attending: Hematology and Oncology

## 2023-10-19 VITALS — BP 118/82 | HR 79 | Temp 97.9°F | Resp 18

## 2023-10-19 DIAGNOSIS — C50212 Malignant neoplasm of upper-inner quadrant of left female breast: Secondary | ICD-10-CM | POA: Insufficient documentation

## 2023-10-19 DIAGNOSIS — Z5111 Encounter for antineoplastic chemotherapy: Secondary | ICD-10-CM | POA: Diagnosis not present

## 2023-10-19 DIAGNOSIS — Z170 Estrogen receptor positive status [ER+]: Secondary | ICD-10-CM

## 2023-10-19 MED ORDER — GOSERELIN ACETATE 3.6 MG ~~LOC~~ IMPL
3.6000 mg | DRUG_IMPLANT | SUBCUTANEOUS | Status: DC
  Administered 2023-10-19: 3.6 mg via SUBCUTANEOUS
  Filled 2023-10-19: qty 3.6

## 2023-10-26 ENCOUNTER — Encounter: Payer: Self-pay | Admitting: Family Medicine

## 2023-10-26 ENCOUNTER — Ambulatory Visit (INDEPENDENT_AMBULATORY_CARE_PROVIDER_SITE_OTHER): Payer: BC Managed Care – PPO | Admitting: Family Medicine

## 2023-10-26 VITALS — BP 117/76 | HR 99 | Temp 98.5°F | Ht 65.0 in | Wt 231.0 lb

## 2023-10-26 DIAGNOSIS — R12 Heartburn: Secondary | ICD-10-CM | POA: Diagnosis not present

## 2023-10-26 DIAGNOSIS — M722 Plantar fascial fibromatosis: Secondary | ICD-10-CM

## 2023-10-26 DIAGNOSIS — E66812 Obesity, class 2: Secondary | ICD-10-CM

## 2023-10-26 DIAGNOSIS — E88819 Insulin resistance, unspecified: Secondary | ICD-10-CM

## 2023-10-26 DIAGNOSIS — Z6838 Body mass index (BMI) 38.0-38.9, adult: Secondary | ICD-10-CM

## 2023-10-26 NOTE — Assessment & Plan Note (Signed)
Improving.  Patient is actively working on weight reduction, wearing shoe inserts and has proper sneakers for walking.  She was able to walk this weekend at a family event at her son's school.  We set a walking goal for 30 minutes 3-4 times a week.  Watch for worsening pain if walking time needs to be reduced

## 2023-10-26 NOTE — Assessment & Plan Note (Signed)
Worsening due to recent introduction of Zepbound 2.5 mg once weekly injection We discussed avoiding late night eating, high acid foods and listening to fullness cues  She may start using Pepcid AC OTC as needed acid reflux, heartburn

## 2023-10-26 NOTE — Assessment & Plan Note (Signed)
Last fasting insulin high at 24.4.  She is currently not on metformin.  She is doing well with the introduction of Zepbound 2.5 mg once weekly injection along with a lower sugar/low starch diet.  She plans to increase her walking time to help with insulin sensitivity.  Plan to repeat fasting insulin level in 3 months

## 2023-10-26 NOTE — Progress Notes (Signed)
Office: [phone]  /  Fax: [phone]  WEIGHT SUMMARY AND BIOMETRICS  Starting Date: 08/26/23  Starting Weight: 241 lb   Weight Lost Since Last Visit: 2 lb   Vitals Temp: 98.5 F (36.9 C) BP: 117/76 Pulse Rate: 99 SpO2: 99 %   Body Composition  Body Fat %: 45.9 % Fat Mass (lbs): 106.2 lbs Muscle Mass (lbs): 118.6 lbs Total Body Water (lbs): 83.2 lbs Visceral Fat Rating : 13     HPI  Chief Complaint: OBESITY  Caitlin [name] is here to discuss her progress with her obesity treatment plan. She is on the the Category 1 Plan and states she is following her eating plan approximately 70 % of the time. She states she is exercising walking and weights 30 minutes 2 times per week.   Interval History:  Since last office visit she is down 2 lb This gives her a net weight loss of 10 lb in 8 weeks of medically supervised weight management She has been able to walk more with plantar fasciitis improving She is tracking her daily intake averaging 1400 cal/day and 90 g of protein She is looking for more lunch options She did start Zepbound 2.5 mg single dose vials via [facility] yesterday She has seen some heartburn today  Pharmacotherapy: Zepbound 2.5 mg once weekly injection, cash pay  PHYSICAL EXAM:  Blood pressure 117/76, pulse 99, temperature 98.5 F (36.9 C), height 5\' 5"  (1.651 m), weight 231 lb (104.8 kg), SpO2 99%. Body mass index is 38.44 kg/m.  General: She is overweight, cooperative, alert, well developed, and in no acute distress. PSYCH: Has normal mood, affect and thought process.   Lungs: Normal breathing effort, no conversational dyspnea.   ASSESSMENT AND PLAN  TREATMENT PLAN FOR OBESITY:  Recommended Dietary Goals  Crystine is currently in the action stage of change. As such, her goal is to continue weight management plan. She has agreed to keeping a food journal and adhering to recommended goals of 1400 calories and 90+ grams of  protein. -She was on category 2 not category 1 meal plan prior -She may substitute out lunch on her meal plan for a salad with protein option, measuring out salad dressing as reviewed on after visit summary -Prioritize higher protein snacks  Behavioral Intervention  We discussed the following Behavioral Modification Strategies today: increasing lean protein intake to established goals, increasing fiber rich foods, work on meal planning and preparation, keeping healthy foods at home, avoiding temptations and identifying enticing environmental cues, planning for success, and continue to work on maintaining a reduced calorie state, getting the recommended amount of protein, incorporating whole foods, making healthy choices, staying well hydrated and practicing mindfulness when eating..  Additional resources provided today: NA  Recommended Physical Activity Goals  [place]i has been advised to work up to 150 minutes of moderate intensity aerobic activity a week and strengthening exercises 2-3 times per week for cardiovascular health, weight loss maintenance and preservation of muscle mass.   She has agreed to Start aerobic activity with a goal of 150 minutes a week at moderate intensity.  -Start with a walking goal of 30 minutes 3 times a week -Wear proper sneakers with plantar fasciitis  Pharmacotherapy changes for the treatment of obesity: Continue Zepbound 2.5 mg once weekly injection  ASSOCIATED CONDITIONS ADDRESSED TODAY  Insulin resistance Assessment & Plan: Last fasting insulin high at 24.4.  She is currently not on metformin.  She is doing well with the introduction of Zepbound 2.5 mg  once weekly injection along with a lower sugar/low starch diet.  She plans to increase her walking time to help with insulin sensitivity.  Plan to repeat fasting insulin level in 3 months   Class 2 severe obesity due to excess calories with serious comorbidity and body mass index (BMI) of 38.0 to 38.9  in adult [facility])  Heartburn Assessment & Plan: Worsening due to recent introduction of Zepbound 2.5 mg once weekly injection We discussed avoiding late night eating, high acid foods and listening to fullness cues  She may start using Pepcid AC OTC as needed acid reflux, heartburn   Plantar fasciitis Assessment & Plan: Improving.  Patient is actively working on weight reduction, wearing shoe inserts and has proper sneakers for walking.  She was able to walk this weekend at a family event at her son's school.  We set a walking goal for 30 minutes 3-4 times a week.  Watch for worsening pain if walking time needs to be reduced       She was informed of the importance of frequent follow up visits to maximize her success with intensive lifestyle modifications for her multiple health conditions.   ATTESTASTION STATEMENTS:  Reviewed by clinician on day of visit: allergies, medications, problem list, medical history, surgical history, family history, social history, and previous encounter notes pertinent to obesity diagnosis.   I have personally spent 30 minutes total time today in preparation, patient care, nutritional counseling and documentation for this visit, including the following: review of clinical lab tests; review of medical tests/procedures/services.      [name], DO DABFM, DABOM [facility] [address] [place], [place] [address] [phone]

## 2023-10-26 NOTE — Patient Instructions (Addendum)
Pick up Pepcid AC to use as needed for heartburn  You can rotate in more salads for lunch Pick a protein source Measure your salad dressing  Keep calories around 1400 per day This should include 90 g of protein daily  Hydrate well with water Add in one sugar free electrolyte pack daily  Try to increase walking time to 30 min 3-4 x a week  Keep me posted on your Zepbound dose!

## 2023-11-05 DIAGNOSIS — E559 Vitamin D deficiency, unspecified: Secondary | ICD-10-CM | POA: Diagnosis not present

## 2023-11-05 DIAGNOSIS — I10 Essential (primary) hypertension: Secondary | ICD-10-CM | POA: Diagnosis not present

## 2023-11-05 DIAGNOSIS — R7301 Impaired fasting glucose: Secondary | ICD-10-CM | POA: Diagnosis not present

## 2023-11-05 DIAGNOSIS — F339 Major depressive disorder, recurrent, unspecified: Secondary | ICD-10-CM | POA: Diagnosis not present

## 2023-11-05 DIAGNOSIS — F4321 Adjustment disorder with depressed mood: Secondary | ICD-10-CM | POA: Diagnosis not present

## 2023-11-09 ENCOUNTER — Other Ambulatory Visit: Payer: Self-pay | Admitting: Family Medicine

## 2023-11-16 ENCOUNTER — Inpatient Hospital Stay: Payer: BC Managed Care – PPO | Attending: Hematology and Oncology

## 2023-11-16 VITALS — BP 98/85 | HR 77 | Temp 97.7°F | Resp 17

## 2023-11-16 DIAGNOSIS — C50412 Malignant neoplasm of upper-outer quadrant of left female breast: Secondary | ICD-10-CM | POA: Diagnosis not present

## 2023-11-16 DIAGNOSIS — Z170 Estrogen receptor positive status [ER+]: Secondary | ICD-10-CM | POA: Insufficient documentation

## 2023-11-16 DIAGNOSIS — Z5111 Encounter for antineoplastic chemotherapy: Secondary | ICD-10-CM | POA: Insufficient documentation

## 2023-11-16 MED ORDER — GOSERELIN ACETATE 3.6 MG ~~LOC~~ IMPL
3.6000 mg | DRUG_IMPLANT | SUBCUTANEOUS | Status: DC
  Administered 2023-11-16: 3.6 mg via SUBCUTANEOUS
  Filled 2023-11-16: qty 3.6

## 2023-11-16 MED ORDER — TIRZEPATIDE-WEIGHT MANAGEMENT 5 MG/0.5ML ~~LOC~~ SOLN: 5.0000 mg | SUBCUTANEOUS | 0 refills | Status: DC

## 2023-11-19 ENCOUNTER — Encounter: Payer: Self-pay | Admitting: Family Medicine

## 2023-11-19 ENCOUNTER — Ambulatory Visit: Payer: BC Managed Care – PPO | Admitting: Family Medicine

## 2023-11-19 VITALS — BP 119/81 | HR 77 | Temp 98.0°F | Ht 65.0 in | Wt 222.0 lb

## 2023-11-19 DIAGNOSIS — E66812 Obesity, class 2: Secondary | ICD-10-CM | POA: Diagnosis not present

## 2023-11-19 DIAGNOSIS — E88819 Insulin resistance, unspecified: Secondary | ICD-10-CM

## 2023-11-19 DIAGNOSIS — Z9189 Other specified personal risk factors, not elsewhere classified: Secondary | ICD-10-CM

## 2023-11-19 DIAGNOSIS — Z6836 Body mass index (BMI) 36.0-36.9, adult: Secondary | ICD-10-CM | POA: Diagnosis not present

## 2023-11-19 DIAGNOSIS — E6609 Other obesity due to excess calories: Secondary | ICD-10-CM

## 2023-11-19 NOTE — Assessment & Plan Note (Signed)
Last fasting insulin elevated at 24.4.  Patient has been actively working on reducing her intake of added sugar and refined carbohydrates.  She has done a good job of medically supervised weight loss with a 7.8% total body weight loss in 2 months.  She has started Zepbound, cash pay for the past 4 weeks for obesity management  Continue logging daily caloric intake averaging 1400 cal/day which should include 90 or more grams of protein daily.  Avoid high sugar foods and drinks.  Continue Zepbound for obesity management.

## 2023-11-19 NOTE — Assessment & Plan Note (Signed)
Patient has added in weight training 2-3 times a week.  Her limitations with exercise have previously included plantar fasciitis and intracranial hypertension.  Her energy level is starting to improve.  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  Plan increase walking time as plantar fasciitis improves.  Congratulated her on adding in weight training.  Aim for 3 days a week.

## 2023-11-19 NOTE — Progress Notes (Signed)
Office: [phone]  /  Fax: [phone]  WEIGHT SUMMARY AND BIOMETRICS  Starting Date: 08/26/23  Starting Weight: 241lb   Weight Lost Since Last Visit: 9lb   Vitals Temp: 98 F (36.7 C) BP: 119/81 Pulse Rate: 77 SpO2: 98 %   Body Composition  Body Fat %: 44.7 % Fat Mass (lbs): 99.6 lbs Muscle Mass (lbs): 117 lbs Total Body Water (lbs): 79.6 lbs Visceral Fat Rating : 12    HPI  Chief Complaint: OBESITY  Caitlin [name] is here to discuss her progress with her obesity treatment plan. She is on the the Category 1 Plan and states she is following her eating plan approximately 85 % of the time. She states she is exercising  doing strength training for 30 minutes 3 times per week. - on cat 2  Interval History:  Since last office visit she is down 9 lb Hunger under much better control with new start of Zepbound 2.5 mg x 4 weeks She will be going up to the 5 mg dose this week Food noise is much improved Had slight heartburn and nausea  She is getting in fruits and veggies She is getting protein with all of her meals She has a net weight loss of 19 lb in the past 2 mos This is a 7.8% total body weight loss  Pharmacotherapy: Zepbound 5 mg weekly  PHYSICAL EXAM:  Blood pressure 119/81, pulse 77, temperature 98 F (36.7 C), height 5\' 5"  (1.651 m), weight 222 lb (100.7 kg), SpO2 98%. Body mass index is 36.94 kg/m.  General: She is overweight, cooperative, alert, well developed, and in no acute distress. PSYCH: Has normal mood, affect and thought process.   Lungs: Normal breathing effort, no conversational dyspnea.  ASSESSMENT AND PLAN  TREATMENT PLAN FOR OBESITY:  Recommended Dietary Goals  Caitlin [name] is currently in the action stage of change. As such, her goal is to continue weight management plan. She has agreed to keeping a food journal and adhering to recommended goals of 1400 calories and 90 or more grams of protein.  Behavioral Intervention  We discussed  the following Behavioral Modification Strategies today: increasing lean protein intake to established goals, increasing vegetables, increasing lower glycemic fruits, increasing fiber rich foods, work on tracking and journaling calories using tracking application, keeping healthy foods at home, work on managing stress, creating time for self-care and relaxation, avoiding temptations and identifying enticing environmental cues, planning for success, and continue to work on maintaining a reduced calorie state, getting the recommended amount of protein, incorporating whole foods, making healthy choices, staying well hydrated and practicing mindfulness when eating..  Additional resources provided today: NA  Recommended Physical Activity Goals  Caitlin [name] has been advised to work up to 150 minutes of moderate intensity aerobic activity a week and strengthening exercises 2-3 times per week for cardiovascular health, weight loss maintenance and preservation of muscle mass.   She has agreed to [employer] strengthening exercises with a goal of 2-3 sessions a week   Pharmacotherapy changes for the treatment of obesity: Increasing Zepbound to 5 mg once weekly injection this week  ASSOCIATED CONDITIONS ADDRESSED TODAY  Insulin resistance Assessment & Plan: Last fasting insulin elevated at 24.4.  Patient has been actively working on reducing her intake of added sugar and refined carbohydrates.  She has done a good job of medically supervised weight loss with a 7.8% total body weight loss in 2 months.  She has started Zepbound, cash pay for the past 4 weeks for obesity management  Continue logging daily caloric intake averaging 1400 cal/day which should include 90 or more grams of protein daily.  Avoid high sugar foods and drinks.  Continue Zepbound for obesity management.   Class 2 obesity due to excess calories with body mass index (BMI) of 36.0 to 36.9 in adult, unspecified whether serious comorbidity  present  Sedentary lifestyle Assessment & Plan: Patient has added in weight training 2-3 times a week.  Her limitations with exercise have previously included plantar fasciitis and intracranial hypertension.  Her energy level is starting to improve.  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  Plan increase walking time as plantar fasciitis improves.  Congratulated her on adding in weight training.  Aim for 3 days a week.       She was informed of the importance of frequent follow up visits to maximize her success with intensive lifestyle modifications for her multiple health conditions.   ATTESTASTION STATEMENTS:  Reviewed by clinician on day of visit: allergies, medications, problem list, medical history, surgical history, family history, social history, and previous encounter notes pertinent to obesity diagnosis.   I have personally spent 30 minutes total time today in preparation, patient care, nutritional counseling and documentation for this visit, including the following: review of clinical lab tests; review of medical tests/procedures/services.      Caitlin Brink, DO DABFM, DABOM [facility] [address] [place], [place] [address] [phone]

## 2023-12-07 ENCOUNTER — Other Ambulatory Visit: Payer: Self-pay | Admitting: Family Medicine

## 2023-12-10 DIAGNOSIS — F4321 Adjustment disorder with depressed mood: Secondary | ICD-10-CM | POA: Diagnosis not present

## 2023-12-14 ENCOUNTER — Inpatient Hospital Stay: Payer: BC Managed Care – PPO

## 2023-12-14 ENCOUNTER — Inpatient Hospital Stay: Payer: BC Managed Care – PPO | Attending: Hematology and Oncology

## 2023-12-14 VITALS — BP 121/83 | HR 84 | Temp 98.7°F | Resp 18

## 2023-12-14 DIAGNOSIS — C50412 Malignant neoplasm of upper-outer quadrant of left female breast: Secondary | ICD-10-CM | POA: Diagnosis not present

## 2023-12-14 DIAGNOSIS — Z5111 Encounter for antineoplastic chemotherapy: Secondary | ICD-10-CM | POA: Insufficient documentation

## 2023-12-14 MED ORDER — GOSERELIN ACETATE 3.6 MG ~~LOC~~ IMPL
3.6000 mg | DRUG_IMPLANT | SUBCUTANEOUS | Status: DC
  Administered 2023-12-14: 3.6 mg via SUBCUTANEOUS
  Filled 2023-12-14: qty 3.6

## 2023-12-16 ENCOUNTER — Ambulatory Visit (INDEPENDENT_AMBULATORY_CARE_PROVIDER_SITE_OTHER): Payer: BC Managed Care – PPO | Admitting: Family Medicine

## 2023-12-16 ENCOUNTER — Encounter: Payer: Self-pay | Admitting: Family Medicine

## 2023-12-16 VITALS — BP 107/65 | HR 92 | Temp 97.7°F | Ht 65.0 in | Wt 217.0 lb

## 2023-12-16 DIAGNOSIS — E66812 Obesity, class 2: Secondary | ICD-10-CM

## 2023-12-16 DIAGNOSIS — Z6836 Body mass index (BMI) 36.0-36.9, adult: Secondary | ICD-10-CM

## 2023-12-16 DIAGNOSIS — M722 Plantar fascial fibromatosis: Secondary | ICD-10-CM | POA: Diagnosis not present

## 2023-12-16 DIAGNOSIS — E88819 Insulin resistance, unspecified: Secondary | ICD-10-CM

## 2023-12-16 MED ORDER — TIRZEPATIDE-WEIGHT MANAGEMENT 5 MG/0.5ML ~~LOC~~ SOLN: 5.0000 mg | SUBCUTANEOUS | 0 refills | Status: DC

## 2023-12-16 NOTE — Assessment & Plan Note (Signed)
Improving weight weight reduction Wearing insoles in shoes Doing calf stretches  Has plans to ramp up walking time

## 2023-12-16 NOTE — Assessment & Plan Note (Signed)
Doing well with a 9.9% TBW loss in the past 3 mos of medically supervised weight management Zepbound has helped her reduce her calorie intake and control sugar cravings and food noise She added in weight training 3 days/ wk and plans to increase walking time   Repeat fasting insulin in 2 mos

## 2023-12-16 NOTE — Progress Notes (Signed)
Office: [phone]  /  Fax: [phone]  WEIGHT SUMMARY AND BIOMETRICS  Starting Date: 08/26/23  Starting Weight: 241lb   Weight Lost Since Last Visit: 5lb   Vitals Temp: 97.7 F (36.5 C) BP: 107/65 Pulse Rate: 92 SpO2: 97 %   Body Composition  Body Fat %: 44.2 % Fat Mass (lbs): 96 lbs Muscle Mass (lbs): 114.8 lbs Total Body Water (lbs): 79.4 lbs Visceral Fat Rating : 12     HPI  Chief Complaint: OBESITY  Caitlin [name] is here to discuss her progress with her obesity treatment plan. She is on the the Category 1 Plan and states she is following her eating plan approximately 75 % of the time. She states she is exercising for 40 minutes 3 times per week. She is also walking 2 days a week for 30 minutes.    Interval History:  Since last office visit she is down 5 lb She has done well with Zepbound 5 mg once weekly injection She has has some abdominal cramping and loose stools from eating a meal too fast She has adequate satiety with meals She is tracking her intake getting in 70-90 g of protein per day She is getting in fruits and veggies daily and is working on her water intake She did increase weights with a  trainer 3 x a week She now has a net weight loss of 24 lb in the past 3 mos of medically supervised weight management This is a 9.9% TBW loss PF pain has improved some  Pharmacotherapy: Zepbound 5 mg weekly [facility]  PHYSICAL EXAM:  Blood pressure 107/65, pulse 92, temperature 97.7 F (36.5 C), height 5\' 5"  (1.651 m), weight 217 lb (98.4 kg), SpO2 97%. Body mass index is 36.11 kg/m.  General: She is overweight, cooperative, alert, well developed, and in no acute distress. PSYCH: Has normal mood, affect and thought process.   Lungs: Normal breathing effort, no conversational dyspnea.   ASSESSMENT AND PLAN  TREATMENT PLAN FOR OBESITY:  Recommended Dietary Goals  Caitlin [name] is currently in the action stage of change. As such, her goal  is to continue weight management plan. She has agreed to keeping a food journal and adhering to recommended goals of 1400 calories and 85+ g of  protein.  Behavioral Intervention  We discussed the following Behavioral Modification Strategies today: increasing lean protein intake to established goals, increasing fiber rich foods, avoiding skipping meals, increasing water intake , work on meal planning and preparation, work on Counselling psychologist calories using tracking application, keeping healthy foods at home, planning for success, and continue to work on maintaining a reduced calorie state, getting the recommended amount of protein, incorporating whole foods, making healthy choices, staying well hydrated and practicing mindfulness when eating..  Additional resources provided today: NA  Recommended Physical Activity Goals  Caitlin [name] has been advised to work up to 150 minutes of moderate intensity aerobic activity a week and strengthening exercises 2-3 times per week for cardiovascular health, weight loss maintenance and preservation of muscle mass.   She has agreed to Work on scheduling and tracking physical activity.   Pharmacotherapy changes for the treatment of obesity: none  ASSOCIATED CONDITIONS ADDRESSED TODAY  Insulin resistance Assessment & Plan: Doing well with a 9.9% TBW loss in the past 3 mos of medically supervised weight management Zepbound has helped her reduce her calorie intake and control sugar cravings and food noise She added in weight training 3 days/ wk and plans to increase walking  time   Repeat fasting insulin in 2 mos   Class 2 obesity due to excess calories with body mass index (BMI) of 36.0 to 36.9 in adult, unspecified whether serious comorbidity present -     Tirzepatide-Weight Management; Inject 5 mg into the skin once a week.  Dispense: 2 mL; Refill: 0  Plantar fasciitis Assessment & Plan: Improving weight weight reduction Wearing insoles in  shoes Doing calf stretches  Has plans to ramp up walking time       She was informed of the importance of frequent follow up visits to maximize her success with intensive lifestyle modifications for her multiple health conditions.   ATTESTASTION STATEMENTS:  Reviewed by clinician on day of visit: allergies, medications, problem list, medical history, surgical history, family history, social history, and previous encounter notes pertinent to obesity diagnosis.   I have personally spent 30 minutes total time today in preparation, patient care, nutritional counseling and documentation for this visit, including the following: review of clinical lab tests; review of medical tests/procedures/services.      Caitlin Brink, DO DABFM, DABOM [facility] [address] [place], [place] [address] [phone]

## 2024-01-06 ENCOUNTER — Other Ambulatory Visit: Payer: Self-pay | Admitting: Family Medicine

## 2024-01-06 DIAGNOSIS — E6609 Other obesity due to excess calories: Secondary | ICD-10-CM

## 2024-01-11 ENCOUNTER — Inpatient Hospital Stay: Payer: BC Managed Care – PPO | Attending: Hematology and Oncology

## 2024-01-11 VITALS — BP 109/79 | HR 83 | Temp 98.3°F | Resp 19

## 2024-01-11 DIAGNOSIS — M25551 Pain in right hip: Secondary | ICD-10-CM | POA: Diagnosis not present

## 2024-01-11 DIAGNOSIS — Z8041 Family history of malignant neoplasm of ovary: Secondary | ICD-10-CM | POA: Diagnosis not present

## 2024-01-11 DIAGNOSIS — Z881 Allergy status to other antibiotic agents status: Secondary | ICD-10-CM | POA: Insufficient documentation

## 2024-01-11 DIAGNOSIS — Z1721 Progesterone receptor positive status: Secondary | ICD-10-CM | POA: Diagnosis not present

## 2024-01-11 DIAGNOSIS — Z79899 Other long term (current) drug therapy: Secondary | ICD-10-CM | POA: Insufficient documentation

## 2024-01-11 DIAGNOSIS — I10 Essential (primary) hypertension: Secondary | ICD-10-CM | POA: Insufficient documentation

## 2024-01-11 DIAGNOSIS — Z833 Family history of diabetes mellitus: Secondary | ICD-10-CM | POA: Insufficient documentation

## 2024-01-11 DIAGNOSIS — Z9049 Acquired absence of other specified parts of digestive tract: Secondary | ICD-10-CM | POA: Diagnosis not present

## 2024-01-11 DIAGNOSIS — E785 Hyperlipidemia, unspecified: Secondary | ICD-10-CM | POA: Diagnosis not present

## 2024-01-11 DIAGNOSIS — Z8249 Family history of ischemic heart disease and other diseases of the circulatory system: Secondary | ICD-10-CM | POA: Insufficient documentation

## 2024-01-11 DIAGNOSIS — C50412 Malignant neoplasm of upper-outer quadrant of left female breast: Secondary | ICD-10-CM | POA: Insufficient documentation

## 2024-01-11 DIAGNOSIS — Z8349 Family history of other endocrine, nutritional and metabolic diseases: Secondary | ICD-10-CM | POA: Diagnosis not present

## 2024-01-11 DIAGNOSIS — Z809 Family history of malignant neoplasm, unspecified: Secondary | ICD-10-CM | POA: Insufficient documentation

## 2024-01-11 DIAGNOSIS — Z79811 Long term (current) use of aromatase inhibitors: Secondary | ICD-10-CM | POA: Diagnosis not present

## 2024-01-11 DIAGNOSIS — Z8051 Family history of malignant neoplasm of kidney: Secondary | ICD-10-CM | POA: Diagnosis not present

## 2024-01-11 DIAGNOSIS — Z841 Family history of disorders of kidney and ureter: Secondary | ICD-10-CM | POA: Insufficient documentation

## 2024-01-11 DIAGNOSIS — Z818 Family history of other mental and behavioral disorders: Secondary | ICD-10-CM | POA: Insufficient documentation

## 2024-01-11 DIAGNOSIS — Z5111 Encounter for antineoplastic chemotherapy: Secondary | ICD-10-CM | POA: Diagnosis not present

## 2024-01-11 DIAGNOSIS — Z8261 Family history of arthritis: Secondary | ICD-10-CM | POA: Insufficient documentation

## 2024-01-11 DIAGNOSIS — Z83438 Family history of other disorder of lipoprotein metabolism and other lipidemia: Secondary | ICD-10-CM | POA: Insufficient documentation

## 2024-01-11 DIAGNOSIS — Z825 Family history of asthma and other chronic lower respiratory diseases: Secondary | ICD-10-CM | POA: Diagnosis not present

## 2024-01-11 DIAGNOSIS — Z170 Estrogen receptor positive status [ER+]: Secondary | ICD-10-CM | POA: Insufficient documentation

## 2024-01-11 MED ORDER — GOSERELIN ACETATE 3.6 MG ~~LOC~~ IMPL
3.6000 mg | DRUG_IMPLANT | SUBCUTANEOUS | Status: DC
  Administered 2024-01-11: 3.6 mg via SUBCUTANEOUS
  Filled 2024-01-11: qty 3.6

## 2024-01-13 ENCOUNTER — Ambulatory Visit: Payer: BC Managed Care – PPO | Admitting: Family Medicine

## 2024-01-13 ENCOUNTER — Encounter: Payer: Self-pay | Admitting: Family Medicine

## 2024-01-13 VITALS — BP 106/66 | HR 92 | Temp 98.3°F | Ht 65.0 in | Wt 213.0 lb

## 2024-01-13 DIAGNOSIS — Z6835 Body mass index (BMI) 35.0-35.9, adult: Secondary | ICD-10-CM

## 2024-01-13 DIAGNOSIS — R12 Heartburn: Secondary | ICD-10-CM | POA: Diagnosis not present

## 2024-01-13 DIAGNOSIS — E88819 Insulin resistance, unspecified: Secondary | ICD-10-CM | POA: Diagnosis not present

## 2024-01-13 DIAGNOSIS — E66812 Obesity, class 2: Secondary | ICD-10-CM

## 2024-01-13 MED ORDER — TIRZEPATIDE-WEIGHT MANAGEMENT 5 MG/0.5ML ~~LOC~~ SOLN: 5.0000 mg | SUBCUTANEOUS | 0 refills | Status: DC

## 2024-01-13 NOTE — Assessment & Plan Note (Signed)
 Doing well with a low sugar diet and has seen an 11.6% TBW loss in 4 mos of medically supervised wt management.  Doing well on Zepbound  for obesity management. Added in resistance training 3 days/ wk and plans to increase walking time.  Repeat fasting insulin  next visit Increase walking time over the next 2 mos

## 2024-01-13 NOTE — Progress Notes (Signed)
 Office: [phone]  /  Fax: [phone]  WEIGHT SUMMARY AND BIOMETRICS  Starting Date: 08/26/23  Starting Weight: 241lb   Weight Lost Since Last Visit: 4lb   Vitals Temp: 98.3 F (36.8 C) BP: 106/66 Pulse Rate: 92 SpO2: 100 %   Body Composition  Body Fat %: 43.9 % Fat Mass (lbs): 93.6 lbs Muscle Mass (lbs): 113.6 lbs Total Body Water (lbs): 78.6 lbs Visceral Fat Rating : 11    HPI  Chief Complaint: OBESITY  Caitlin [name] is here to discuss her progress with her obesity treatment plan. She is on the the Category 1 Plan and states she is following her eating plan approximately 80 % of the time. She states she is exercising 30-60 minutes 3 times per week.  Interval History:  Since last office visit she is down 4 lb She did bake over the holidays and had a little bit more sweets which induced more cravings She is doing well on Zepbound  5 mg weekly (cash pay) She has a net weight loss of 28 lb in the past 4 mos of medically supervised weight management This is an 11.6% TBW loss She is doing strength training via a live program 3 days She plans to increase walking, weather permitting She is getting ~1400 cal/ day and focuses on protein and fiber with meals She had a few occasions of heartburn at night triggered by sparkling water  Pharmacotherapy: Zepbound  5 mg weekly  PHYSICAL EXAM:  Blood pressure 106/66, pulse 92, temperature 98.3 F (36.8 C), height 5\' 5"  (1.651 m), weight 213 lb (96.6 kg), SpO2 100%. Body mass index is 35.45 kg/m.  General: She is overweight, cooperative, alert, well developed, and in no acute distress. PSYCH: Has normal mood, affect and thought process.   Lungs: Normal breathing effort, no conversational dyspnea.   ASSESSMENT AND PLAN  TREATMENT PLAN FOR OBESITY:  Recommended Dietary Goals  Caitlin [name] is currently in the action stage of change. As such, her goal is to continue weight management plan. She has agreed to the Category 2  Plan.  Behavioral Intervention  We discussed the following Behavioral Modification Strategies today: increasing lean protein intake to established goals, increasing fiber rich foods, increasing water intake , work on meal planning and preparation, keeping healthy foods at home, practice mindfulness eating and understand the difference between hunger signals and cravings, planning for success, and continue to work on maintaining a reduced calorie state, getting the recommended amount of protein, incorporating whole foods, making healthy choices, staying well hydrated and practicing mindfulness when eating..  Additional resources provided today: NA  Recommended Physical Activity Goals  Caitlin [name] has been advised to work up to 150 minutes of moderate intensity aerobic activity a week and strengthening exercises 2-3 times per week for cardiovascular health, weight loss maintenance and preservation of muscle mass.   She has agreed to Increase the intensity, frequency or duration of strengthening exercises  and Increase the intensity, frequency or duration of aerobic exercises    Pharmacotherapy changes for the treatment of obesity: none  ASSOCIATED CONDITIONS ADDRESSED TODAY  Insulin  resistance Assessment & Plan: Doing well with a low sugar diet and has seen an 11.6% TBW loss in 4 mos of medically supervised wt management.  Doing well on Zepbound  for obesity management. Added in resistance training 3 days/ wk and plans to increase walking time.  Repeat fasting insulin  next visit Increase walking time over the next 2 mos   Class 2 obesity due to excess calories with body mass  index (BMI) of 35.0 to 35.9 in adult, unspecified whether serious comorbidity present -     Tirzepatide -Weight Management; Inject 5 mg into the skin once a week.  Dispense: 2 mL; Refill: 0  Heartburn Assessment & Plan: Induced by Zepbound  + drinking sparkling water at night Improved using Famotidine prn  Plan  Limit  late night eating, high acid foods and sparkling drinks at night Continue use of famotidine prn       She was informed of the importance of frequent follow up visits to maximize her success with intensive lifestyle modifications for her multiple health conditions.   ATTESTASTION STATEMENTS:  Reviewed by clinician on day of visit: allergies, medications, problem list, medical history, surgical history, family history, social history, and previous encounter notes pertinent to obesity diagnosis.   I have personally spent 30 minutes total time today in preparation, patient care, nutritional counseling and documentation for this visit, including the following: review of clinical lab tests; review of medical tests/procedures/services.      Caitlin Rumple, DO DABFM, DABOM [facility] [address] [place], [place] [address] [phone]

## 2024-01-13 NOTE — Assessment & Plan Note (Signed)
 Induced by Zepbound  + drinking sparkling water at night Improved using Famotidine prn  Plan  Limit late night eating, high acid foods and sparkling drinks at night Continue use of famotidine prn

## 2024-01-14 DIAGNOSIS — F4321 Adjustment disorder with depressed mood: Secondary | ICD-10-CM | POA: Diagnosis not present

## 2024-01-19 ENCOUNTER — Encounter: Payer: Self-pay | Admitting: Hematology and Oncology

## 2024-01-19 DIAGNOSIS — C50412 Malignant neoplasm of upper-outer quadrant of left female breast: Secondary | ICD-10-CM

## 2024-01-20 ENCOUNTER — Other Ambulatory Visit: Payer: Self-pay | Admitting: Hematology and Oncology

## 2024-01-20 ENCOUNTER — Encounter: Payer: Self-pay | Admitting: Hematology and Oncology

## 2024-01-20 NOTE — Telephone Encounter (Signed)
Pt sent My chart message stating she has had on and off  Right hip pain for several months. Pt states the last few days the pain has been severe. Discussed with Dr Al Pimple, Per Dr Al Pimple Pt to come in for an appointment. Informed Pt to look at my chart for appointment. Also requested an xray.

## 2024-01-21 ENCOUNTER — Inpatient Hospital Stay (HOSPITAL_BASED_OUTPATIENT_CLINIC_OR_DEPARTMENT_OTHER): Payer: BC Managed Care – PPO | Admitting: Adult Health

## 2024-01-21 ENCOUNTER — Telehealth: Payer: Self-pay

## 2024-01-21 ENCOUNTER — Encounter: Payer: Self-pay | Admitting: Adult Health

## 2024-01-21 ENCOUNTER — Ambulatory Visit (HOSPITAL_COMMUNITY)
Admission: RE | Admit: 2024-01-21 | Discharge: 2024-01-21 | Disposition: A | Discharge status: Home / Self Care | Payer: BC Managed Care – PPO | Source: Ambulatory Visit | Attending: Adult Health | Admitting: Adult Health

## 2024-01-21 VITALS — BP 113/75 | HR 92 | Temp 97.6°F | Resp 16 | Ht 65.0 in | Wt 215.8 lb

## 2024-01-21 DIAGNOSIS — Z79811 Long term (current) use of aromatase inhibitors: Secondary | ICD-10-CM | POA: Diagnosis not present

## 2024-01-21 DIAGNOSIS — M25551 Pain in right hip: Secondary | ICD-10-CM | POA: Diagnosis not present

## 2024-01-21 DIAGNOSIS — Z170 Estrogen receptor positive status [ER+]: Secondary | ICD-10-CM | POA: Diagnosis not present

## 2024-01-21 DIAGNOSIS — Z9049 Acquired absence of other specified parts of digestive tract: Secondary | ICD-10-CM | POA: Diagnosis not present

## 2024-01-21 DIAGNOSIS — Z1721 Progesterone receptor positive status: Secondary | ICD-10-CM | POA: Diagnosis not present

## 2024-01-21 DIAGNOSIS — Z8349 Family history of other endocrine, nutritional and metabolic diseases: Secondary | ICD-10-CM | POA: Diagnosis not present

## 2024-01-21 DIAGNOSIS — Z79899 Other long term (current) drug therapy: Secondary | ICD-10-CM | POA: Diagnosis not present

## 2024-01-21 DIAGNOSIS — Z833 Family history of diabetes mellitus: Secondary | ICD-10-CM | POA: Diagnosis not present

## 2024-01-21 DIAGNOSIS — E785 Hyperlipidemia, unspecified: Secondary | ICD-10-CM | POA: Diagnosis not present

## 2024-01-21 DIAGNOSIS — Z5111 Encounter for antineoplastic chemotherapy: Secondary | ICD-10-CM | POA: Diagnosis not present

## 2024-01-21 DIAGNOSIS — Z881 Allergy status to other antibiotic agents status: Secondary | ICD-10-CM | POA: Diagnosis not present

## 2024-01-21 DIAGNOSIS — C50412 Malignant neoplasm of upper-outer quadrant of left female breast: Secondary | ICD-10-CM | POA: Insufficient documentation

## 2024-01-21 DIAGNOSIS — I10 Essential (primary) hypertension: Secondary | ICD-10-CM | POA: Diagnosis not present

## 2024-01-21 DIAGNOSIS — Z83438 Family history of other disorder of lipoprotein metabolism and other lipidemia: Secondary | ICD-10-CM | POA: Diagnosis not present

## 2024-01-21 DIAGNOSIS — Z8261 Family history of arthritis: Secondary | ICD-10-CM | POA: Diagnosis not present

## 2024-01-21 MED ORDER — METHYLPREDNISOLONE 4 MG PO TBPK: ORAL_TABLET | ORAL | 0 refills | Status: DC

## 2024-01-21 MED ORDER — TRAMADOL HCL 50 MG PO TABS: 50.0000 mg | ORAL_TABLET | Freq: Four times a day (QID) | ORAL | 0 refills | Status: DC | PRN

## 2024-01-21 NOTE — Progress Notes (Signed)
[facility] Cancer Follow up:    Caitlin Davenport, MD [address] [place] [address]   DIAGNOSIS:  Cancer Staging  Malignant neoplasm of upper-outer quadrant of left breast in female, estrogen receptor positive (HCC) Staging form: Breast, AJCC 8th Edition - Clinical: Stage IA (cT1c, cN0, cM0, G2, ER+, PR+, HER2-) - Signed by Rachel Moulds, MD on 07/22/2022 Histologic grading system: 3 grade system   SUMMARY OF ONCOLOGIC HISTORY: Oncology History  Malignant neoplasm of upper-outer quadrant of left breast in female, estrogen receptor positive (HCC)  03/10/2022 Mammogram   Mammogram of the left breast showed irregular hypoechoic mass in the left breast.  Targeted ultrasound performed also confirmed an irregular hypoechoic mass in the left breast at 2:00 2 cm from the nipple measuring 1.1 x 0.8 x 0.5 cm.  Left axilla did not show any enlarged lymphadenopathy.   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  Prognostic showed ER 95% positive moderate staining.  PR 100% positive strong staining.  HER2 equivocal by IHC.  FISH negative    04/02/2022 Initial Diagnosis   Malignant neoplasm of upper-outer quadrant of left breast in female, estrogen receptor positive (HCC)   04/22/2022 Surgery   She had left breast lumpectomy on April 25 and pathology showed invasive ductal carcinoma Nottingham grade 2 out of 3, 1.0 cm, DCIS, intermediate grade, margins uninvolved by carcinoma at 3 out of 3 sentinel lymph nodes without any evidence of micrometastasis or micrometastasis.  Prognostics on prior biopsy showed ER +95% ER +100% staining HER2 negative, KI of less than 5%   04/25/2022 Genetic Testing   Negative genetic testing on the CancerNext-Expanded+RNainsight panel.  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 The report date is April 25, 2022.  The  CancerNext-Expanded gene panel offered by [facility] and includes sequencing and rearrangement analysis for the following 77 genes: AIP, ALK, APC*, ATM*, AXIN2, BAP1, BARD1, BLM, BMPR1A, BRCA1*, BRCA2*, BRIP1*, CDC73, CDH1*, CDK4, CDKN1B, CDKN2A, CHEK2*, CTNNA1, DICER1, FANCC, FH, FLCN, GALNT12, KIF1B, LZTR1, MAX, MEN1, MET, MLH1*, MSH2*, MSH3, MSH6*, MUTYH*, NBN, NF1*, NF2, NTHL1, PALB2*, PHOX2B, PMS2*, POT1, PRKAR1A, PTCH1, PTEN*, RAD51C*, RAD51D*, RB1, RECQL, RET, SDHA, SDHAF2, SDHB, SDHC, SDHD, SMAD4, SMARCA4, SMARCB1, SMARCE1, STK11, SUFU, TMEM127, TP53*, TSC1, TSC2, VHL and XRCC2 (sequencing and deletion/duplication); EGFR, EGLN1, HOXB13, KIT, MITF, PDGFRA, POLD1, and POLE (sequencing only); EPCAM and GREM1 (deletion/duplication only). DNA and RNA analyses performed for * genes.    05/07/2022 Oncotype testing   Oncotype recurrence score of 16, distant recurrence risk at 9 years is 4%, group average absolute chemotherapy benefit less than 1%   06/11/2022 - 07/09/2022 Radiation Therapy   Site Technique Total Dose (Gy) Dose per Fx (Gy) Completed Fx Beam Energies  Breast, Left: Breast_L 3D 40.05/40.05 2.67 15/15 10X  Breast, Left: Breast_L_Bst 3D 10/10 2 5/5 6X     07/22/2022 Cancer Staging   Staging form: Breast, AJCC 8th Edition - Clinical: Stage IA (cT1c, cN0, cM0, G2, ER+, PR+, HER2-) - Signed by Rachel Moulds, MD on 07/22/2022 Histologic grading system: 3 grade system   09/22/2022 -  Anti-estrogen oral therapy   Zoladex started on 08/25/2022 given every 4 weeks, Letrozole began 09/22/2022     CURRENT THERAPY: Zoladex/Letrozole  INTERVAL HISTORY:  Discussed the use of AI scribe software for clinical note transcription with the patient, who gave verbal consent to proceed.   Caitlin Grippe  [name] 53 y.o. female presents with worsening right hip pain over the past few weeks. The pain is constant, worse in the morning, and exacerbated by sitting or bending at the waist. The pain is located in  the front of the hip and radiates down the leg. Over-the-counter Aleve and leftover pain medication from a family member's hip replacement surgery did not alleviate the pain. The patient continues to perform strength training workouts three times a week, but has difficulty with forward bends. Elevating the foot while sitting in an office chair seems to relieve some pressure and alleviate the pain. The patient also reports some discomfort in the back after having to sleep flat on their back due to the hip pain.  She continues on Letrozole daily and receives Zoladex every 4 weeks for her breast cancer, and has been on this treatment since 08/2022.   Patient Active Problem List   Diagnosis Date Noted   Sedentary lifestyle 11/19/2023   Heartburn 10/26/2023   Insulin resistance 09/09/2023   Polyphagia 09/09/2023   Other fatigue 08/26/2023   SOBOE (shortness of breath on exertion) 08/26/2023   History of breast cancer 08/26/2023   Essential hypertension 08/26/2023   Plantar fasciitis 08/26/2023   Depression 08/26/2023   Depression screen 08/26/2023   Pre-diabetes 08/26/2023   Vitamin D deficiency 08/26/2023   Other hyperlipidemia 08/26/2023   BMI 40.0-44.9, adult (HCC) 08/26/2023   Morbid obesity (HCC) with starting BMI 40 08/26/2023   Elevated blood-pressure reading without diagnosis of hypertension 03/26/2023   Family history of colonic polyps 10/22/2022   Genetic testing 04/29/2022   Family history of ovarian cancer 04/15/2022   Family history of kidney cancer 04/15/2022   Malignant neoplasm of upper-outer quadrant of left breast in female, estrogen receptor positive (HCC) 04/02/2022   Endometriosis 02/20/2022   Vitamin D insufficiency 02/18/2021   Elevated cholesterol 02/18/2021   Class 1 obesity due to excess calories with body mass index (BMI) of 34.0 to 34.9 in adult 02/07/2021   Irritable bowel syndrome 02/07/2021   Benign intracranial hypertension 09/10/2020    is allergic to  ciprofloxacin and sulfa antibiotics.  MEDICAL HISTORY: Past Medical History:  Diagnosis Date   Anxiety    Back pain    Breast cancer (HCC)    Depression    Edema of both lower extremities    Elevated cholesterol    Endometriosis    Endometriosis    Family history of kidney cancer    Family history of ovarian cancer    Fatty liver    Gallbladder problem    High blood pressure    High cholesterol    History of IBS    IBS (irritable bowel syndrome)    Infertility, female    Joint pain    Personal history of radiation therapy    Vitamin D deficiency     SURGICAL HISTORY: Past Surgical History:  Procedure Laterality Date   BREAST LUMPECTOMY     BREAST LUMPECTOMY WITH RADIOACTIVE SEED AND SENTINEL LYMPH NODE BIOPSY Left 04/22/2022   Procedure: LEFT BREAST LUMPECTOMY WITH RADIOACTIVE SEED;  Surgeon: Abigail Miyamoto, MD;  Location: [facility] OR;  Service: General;  Laterality: Left;  LMA   CHOLECYSTECTOMY  2018   LAPAROSCOPY     SENTINEL NODE BIOPSY Left 04/22/2022   Procedure: LEFT SENTINEL LYMPH NODE BIOPSY;  Surgeon: Abigail Miyamoto, MD;  Location: [facility] OR;  Service: General;  Laterality: Left;    SOCIAL HISTORY: Social History   Socioeconomic History   Marital status:  Married    Spouse name: Thayer Ohm   Number of children: 3   Years of education: Not on file   Highest education level: Not on file  Occupational History   Occupation: Programmer, multimedia  Tobacco Use   Smoking status: Never   Smokeless tobacco: Never  Vaping Use   Vaping status: Never Used  Substance and Sexual Activity   Alcohol use: No   Drug use: No   Sexual activity: Yes    Birth control/protection: None  Other Topics Concern   Not on file  Social History Narrative   Right Handed   Two Story Home   Drinks Caffeine Occasionally    Social Drivers of Health   Financial Resource Strain: Low Risk  (05/01/2022)   Overall Financial Resource Strain (CARDIA)    Difficulty of Paying Living Expenses: Not hard at all   Food Insecurity: No Food Insecurity (05/01/2022)   Hunger Vital Sign    Worried About Running Out of Food in the Last Year: Never true    Ran Out of Food in the Last Year: Never true  Transportation Needs: No Transportation Needs (05/01/2022)   PRAPARE - Administrator, Civil Service (Medical): No    Lack of Transportation (Non-Medical): No  Physical Activity: Not on file  Stress: Not on file  Social Connections: Unknown (05/13/2022)   Received from [facility]   Social Network    Social Network: Not on file  Intimate Partner Violence: Unknown (04/04/2022)   Received from [facility]   HITS    Physically Hurt: Not on file    Insult or Talk Down To: Not on file    Threaten Physical Harm: Not on file    Scream or Curse: Not on file    FAMILY HISTORY: Family History  Problem Relation Age of Onset   Sleep apnea Mother    Thyroid disease Mother    Depression Mother    Anxiety disorder Mother    Obesity Mother    Hyperlipidemia Father    Hypertension Father    Heart disease Father    Kidney disease Father    Sleep apnea Father    Heart disease Maternal Grandmother    COPD Maternal Grandmother    Heart disease Maternal Grandfather    Ovarian cancer Paternal Grandmother        dx in her 5s   Heart disease Maternal Aunt    Diabetes Maternal Aunt    Kidney cancer Maternal Aunt 78   Rheum arthritis Paternal Aunt    Cancer Cousin        unknown cancer in mat first cousin    Review of Systems  Constitutional:  Negative for appetite change, chills, fatigue, fever and unexpected weight change.  HENT:   Negative for hearing loss, lump/mass and trouble swallowing.   Eyes:  Negative for eye problems and icterus.  Respiratory:  Negative for chest tightness, cough and shortness of breath.   Cardiovascular:  Negative for chest pain, leg swelling and palpitations.  Gastrointestinal:  Negative for abdominal distention, abdominal pain,  constipation, diarrhea, nausea and vomiting.  Endocrine: Negative for hot flashes.  Genitourinary:  Negative for difficulty urinating.   Musculoskeletal:  Negative for arthralgias.  Skin:  Negative for itching and rash.  Neurological:  Negative for dizziness, extremity weakness, headaches and numbness.  Hematological:  Negative for adenopathy. Does not bruise/bleed easily.  Psychiatric/Behavioral:  Negative for depression. The patient is not nervous/anxious.  PHYSICAL EXAMINATION    Vitals:   01/21/24 0920  BP: 113/75  Pulse: 92  Resp: 16  Temp: 97.6 F (36.4 C)  SpO2: 97%    Physical Exam Constitutional:      General: She is not in acute distress.    Appearance: Normal appearance. She is not toxic-appearing.  HENT:     Head: Normocephalic and atraumatic.     Mouth/Throat:     Mouth: Mucous membranes are moist.     Pharynx: Oropharynx is clear. No oropharyngeal exudate or posterior oropharyngeal erythema.  Eyes:     General: No scleral icterus. Cardiovascular:     Rate and Rhythm: Normal rate and regular rhythm.     Pulses: Normal pulses.     Heart sounds: Normal heart sounds.  Pulmonary:     Effort: Pulmonary effort is normal.     Breath sounds: Normal breath sounds.  Abdominal:     General: Abdomen is flat. Bowel sounds are normal. There is no distension.     Palpations: Abdomen is soft.     Tenderness: There is no abdominal tenderness.  Musculoskeletal:        General: No swelling.     Cervical back: Neck supple.     Right hip: No tenderness, bony tenderness or crepitus.     Left hip: No tenderness, bony tenderness or crepitus.     Comments: No TTP when internally rotating the knee over greater trochanter bilaterally, pain elicited when bending leg   Lymphadenopathy:     Cervical: No cervical adenopathy.  Skin:    General: Skin is warm and dry.     Findings: No rash.  Neurological:     General: No focal deficit present.     Mental Status: She is  alert.  Psychiatric:        Mood and Affect: Mood normal.        Behavior: Behavior normal.      ASSESSMENT and THERAPY PLAN:   Malignant neoplasm of upper-outer quadrant of left breast in female, estrogen receptor positive (HCC) Caitlin [name] is a 53 year old woman with stage IA ER/PR positive breast cancer diagnosed 02/2022 s/p lumpectomy, adjuvant radiation, and Zoladex/Letrozole that began 08/2022 here today for f/u and evaluation of right hip pain.    Right Hip Pain Severe, constant pain in the right hip, worse in the morning and with bending at the waist. Pain is not relieved by Aleve. Possible differential diagnoses include joint inflammation, tear, or cancer-related issues. -Order hip and pelvis X-ray stat to evaluate the joint and bones. -If X-ray is inconclusive, consider MRI with contrast. -Prescribe Tramadol for pain management, to be taken as needed. -Prescribe a steroid taper to reduce inflammation, to be started immediately with food. -Advise patient to rest the hip and avoid strenuous exercise, but continue with range of motion exercises. -Continue Letrozole treatment for now, but consider stopping if hip pain does not improve.  Follow-up Await X-ray results, expected within the day. Adjust treatment plan based on imaging findings.    All questions were answered. The patient knows to call the clinic with any problems, questions or concerns. We can certainly see the patient much sooner if necessary.  Total encounter time:30 minutes*in face-to-face visit time, chart review, lab review, care coordination, order entry, and documentation of the encounter time.    Lillard Anes, NP 01/21/24 10:30 AM Medical Oncology and Hematology [facility] [address] [place], [place] [address] Tel. [phone]    Fax.  [phone]  *Total Encounter Time as defined by the Centers for Medicare and Medicaid Services includes, in addition to the face-to-face time of a  patient visit (documented in the note above) non-face-to-face time: obtaining and reviewing outside history, ordering and reviewing medications, tests or procedures, care coordination (communications with other health care professionals or caregivers) and documentation in the medical record.

## 2024-01-21 NOTE — Telephone Encounter (Addendum)
Called pt per Np message below. Pt verbalized understanding of the results. Pt in agreement to have an Mri done. Spoke to pt. Please enter Mri. Thank you----- Message from Noreene Filbert sent at 01/21/2024 12:41 PM EST ----- Xray negative, please let patient know.  We will need to do MRI ----- Message ----- From: Interface, Rad Results In Sent: 01/21/2024  11:01 AM EST To: Loa Socks, NP

## 2024-01-21 NOTE — Assessment & Plan Note (Signed)
Caitlin [name] is a 53 year old woman with stage IA ER/PR positive breast cancer diagnosed 02/2022 s/p lumpectomy, adjuvant radiation, and Zoladex/Letrozole that began 08/2022 here today for f/u and evaluation of right hip pain.    Right Hip Pain Severe, constant pain in the right hip, worse in the morning and with bending at the waist. Pain is not relieved by Aleve. Possible differential diagnoses include joint inflammation, tear, or cancer-related issues. -Order hip and pelvis X-ray stat to evaluate the joint and bones. -If X-ray is inconclusive, consider MRI with contrast. -Prescribe Tramadol for pain management, to be taken as needed. -Prescribe a steroid taper to reduce inflammation, to be started immediately with food. -Advise patient to rest the hip and avoid strenuous exercise, but continue with range of motion exercises. -Continue Letrozole treatment for now, but consider stopping if hip pain does not improve.  Follow-up Await X-ray results, expected within the day. Adjust treatment plan based on imaging findings.

## 2024-01-22 ENCOUNTER — Other Ambulatory Visit: Payer: Self-pay | Admitting: Adult Health

## 2024-01-22 ENCOUNTER — Encounter: Payer: Self-pay | Admitting: Adult Health

## 2024-01-22 DIAGNOSIS — M25551 Pain in right hip: Secondary | ICD-10-CM

## 2024-01-25 ENCOUNTER — Ambulatory Visit: Payer: BC Managed Care – PPO | Attending: Radiation Oncology

## 2024-01-25 VITALS — Wt 216.5 lb

## 2024-01-25 DIAGNOSIS — Z483 Aftercare following surgery for neoplasm: Secondary | ICD-10-CM | POA: Insufficient documentation

## 2024-01-25 NOTE — Therapy (Signed)
OUTPATIENT PHYSICAL THERAPY SOZO SCREENING NOTE   Patient Name: Caitlin [name] MRN: [id number] DOB:July 07, 1971, 53 y.o., female Today's Date: 01/25/2024  PCP: Dois Davenport, MD REFERRING PROVIDER: Lonie Peak, MD   PT End of Session - 01/25/24 [phone]     Visit Number 18   # unchanged due to screen only   PT Start Time 0836    PT Stop Time 0844    PT Time Calculation (min) 8 min    Activity Tolerance Patient tolerated treatment well    Behavior During Therapy WFL for tasks assessed/performed             Past Medical History:  Diagnosis Date   Anxiety    Back pain    Breast cancer (HCC)    Depression    Edema of both lower extremities    Elevated cholesterol    Endometriosis    Endometriosis    Family history of kidney cancer    Family history of ovarian cancer    Fatty liver    Gallbladder problem    High blood pressure    High cholesterol    History of IBS    IBS (irritable bowel syndrome)    Infertility, female    Joint pain    Personal history of radiation therapy    Vitamin D deficiency    Past Surgical History:  Procedure Laterality Date   BREAST LUMPECTOMY     BREAST LUMPECTOMY WITH RADIOACTIVE SEED AND SENTINEL LYMPH NODE BIOPSY Left 04/22/2022   Procedure: LEFT BREAST LUMPECTOMY WITH RADIOACTIVE SEED;  Surgeon: Abigail Miyamoto, MD;  Location: [facility] OR;  Service: General;  Laterality: Left;  LMA   CHOLECYSTECTOMY  2018   LAPAROSCOPY     SENTINEL NODE BIOPSY Left 04/22/2022   Procedure: LEFT SENTINEL LYMPH NODE BIOPSY;  Surgeon: Abigail Miyamoto, MD;  Location: [facility] OR;  Service: General;  Laterality: Left;   Patient Active Problem List   Diagnosis Date Noted   Sedentary lifestyle 11/19/2023   Heartburn 10/26/2023   Insulin resistance 09/09/2023   Polyphagia 09/09/2023   Other fatigue 08/26/2023   SOBOE (shortness of breath on exertion) 08/26/2023   History of breast cancer 08/26/2023   Essential hypertension 08/26/2023   Plantar fasciitis  08/26/2023   Depression 08/26/2023   Depression screen 08/26/2023   Pre-diabetes 08/26/2023   Vitamin D deficiency 08/26/2023   Other hyperlipidemia 08/26/2023   BMI 40.0-44.9, adult (HCC) 08/26/2023   Morbid obesity (HCC) with starting BMI 40 08/26/2023   Elevated blood-pressure reading without diagnosis of hypertension 03/26/2023   Family history of colonic polyps 10/22/2022   Genetic testing 04/29/2022   Family history of ovarian cancer 04/15/2022   Family history of kidney cancer 04/15/2022   Malignant neoplasm of upper-outer quadrant of left breast in female, estrogen receptor positive (HCC) 04/02/2022   Endometriosis 02/20/2022   Vitamin D insufficiency 02/18/2021   Elevated cholesterol 02/18/2021   Class 1 obesity due to excess calories with body mass index (BMI) of 34.0 to 34.9 in adult 02/07/2021   Irritable bowel syndrome 02/07/2021   Benign intracranial hypertension 09/10/2020    REFERRING DIAG: left breast cancer at risk for lymphedema  THERAPY DIAG:  Aftercare following surgery for neoplasm  PERTINENT HISTORY: Patient was diagnosed with left IDC. ER/PR positive, HER2 negative KI67 less than 5%. Pt had left lumpectomy and SLNB on 04/22/22 with Dr. Magnus Ivan. Radiation. No chemotherapy.  Radiation completed 07/09/22. Starting tamoxifen.   PRECAUTIONS: left UE Lymphedema risk, None  SUBJECTIVE: Pt  returns for her 3 month L-Dex screen.   PAIN:  Are you having pain? No  SOZO SCREENING: Patient was assessed today using the SOZO machine to determine the lymphedema index score. This was compared to her baseline score. It was determined that she is within the recommended range when compared to her baseline and no further action is needed at this time. She will continue SOZO screenings. These are done every 3 months for 2 years post operatively followed by every 6 months for 2 years, and then annually.   L-DEX FLOWSHEETS - 01/25/24 0800       L-DEX LYMPHEDEMA SCREENING    Measurement Type Unilateral    L-DEX MEASUREMENT EXTREMITY Upper Extremity    POSITION  Standing    DOMINANT SIDE Right    At Risk Side Left    BASELINE SCORE (UNILATERAL) -2.6    L-DEX SCORE (UNILATERAL) -3.7    VALUE CHANGE (UNILAT) -1.1               Hermenia Bers, PTA 01/25/2024, 8:45 AM

## 2024-02-02 ENCOUNTER — Other Ambulatory Visit: Payer: Self-pay | Admitting: Family Medicine

## 2024-02-02 DIAGNOSIS — E66812 Obesity, class 2: Secondary | ICD-10-CM

## 2024-02-03 DIAGNOSIS — L814 Other melanin hyperpigmentation: Secondary | ICD-10-CM | POA: Diagnosis not present

## 2024-02-03 DIAGNOSIS — D2262 Melanocytic nevi of left upper limb, including shoulder: Secondary | ICD-10-CM | POA: Diagnosis not present

## 2024-02-03 DIAGNOSIS — D2261 Melanocytic nevi of right upper limb, including shoulder: Secondary | ICD-10-CM | POA: Diagnosis not present

## 2024-02-03 DIAGNOSIS — D225 Melanocytic nevi of trunk: Secondary | ICD-10-CM | POA: Diagnosis not present

## 2024-02-08 ENCOUNTER — Inpatient Hospital Stay: Payer: BC Managed Care – PPO | Attending: Hematology and Oncology

## 2024-02-08 VITALS — BP 133/78 | HR 89 | Temp 97.9°F | Resp 16

## 2024-02-08 DIAGNOSIS — Z170 Estrogen receptor positive status [ER+]: Secondary | ICD-10-CM

## 2024-02-08 DIAGNOSIS — Z5111 Encounter for antineoplastic chemotherapy: Secondary | ICD-10-CM | POA: Diagnosis not present

## 2024-02-08 DIAGNOSIS — C50412 Malignant neoplasm of upper-outer quadrant of left female breast: Secondary | ICD-10-CM | POA: Insufficient documentation

## 2024-02-08 MED ORDER — GOSERELIN ACETATE 3.6 MG ~~LOC~~ IMPL
3.6000 mg | DRUG_IMPLANT | SUBCUTANEOUS | Status: DC
  Administered 2024-02-08: 3.6 mg via SUBCUTANEOUS
  Filled 2024-02-08: qty 3.6

## 2024-02-10 ENCOUNTER — Encounter: Payer: Self-pay | Admitting: Family Medicine

## 2024-02-10 ENCOUNTER — Ambulatory Visit (INDEPENDENT_AMBULATORY_CARE_PROVIDER_SITE_OTHER): Payer: BC Managed Care – PPO | Admitting: Family Medicine

## 2024-02-10 VITALS — BP 107/72 | HR 73 | Temp 98.3°F | Ht 65.0 in | Wt 208.0 lb

## 2024-02-10 DIAGNOSIS — R634 Abnormal weight loss: Secondary | ICD-10-CM

## 2024-02-10 DIAGNOSIS — Z6834 Body mass index (BMI) 34.0-34.9, adult: Secondary | ICD-10-CM

## 2024-02-10 DIAGNOSIS — E66811 Obesity, class 1: Secondary | ICD-10-CM

## 2024-02-10 DIAGNOSIS — R7303 Prediabetes: Secondary | ICD-10-CM | POA: Diagnosis not present

## 2024-02-10 DIAGNOSIS — E559 Vitamin D deficiency, unspecified: Secondary | ICD-10-CM | POA: Diagnosis not present

## 2024-02-10 DIAGNOSIS — E6609 Other obesity due to excess calories: Secondary | ICD-10-CM

## 2024-02-10 DIAGNOSIS — E88819 Insulin resistance, unspecified: Secondary | ICD-10-CM

## 2024-02-10 MED ORDER — TIRZEPATIDE-WEIGHT MANAGEMENT 5 MG/0.5ML ~~LOC~~ SOLN: 5.0000 mg | SUBCUTANEOUS | 0 refills | Status: DC

## 2024-02-10 NOTE — Assessment & Plan Note (Signed)
Lab Results  Component Value Date   HGBA1C 5.5 02/04/2021   Due for A1c today Hx of prediabetes in chart Doing great with healthy lifestyle changes and weight reduction

## 2024-02-10 NOTE — Assessment & Plan Note (Signed)
Last vitamin D Lab Results  Component Value Date   VD25OH 24.3 (L) 02/04/2021   She is doing well on OTC vitamin D daily (5,000 international units  daily) Repeat lab today

## 2024-02-10 NOTE — Progress Notes (Signed)
Office: [phone]  /  Fax: [phone]  WEIGHT SUMMARY AND BIOMETRICS  Starting Date: 08/26/23  Starting Weight: 241lb   Weight Lost Since Last Visit: 5lb   Vitals Temp: 98.3 F (36.8 C) BP: 107/72 Pulse Rate: 73 SpO2: 100 %   Body Composition  Body Fat %: 44.1 % Fat Mass (lbs): 92 lbs Muscle Mass (lbs): 110.8 lbs Total Body Water (lbs): 77 lbs Visceral Fat Rating : 11     HPI  Chief Complaint: OBESITY  Caitlin [name] is here to discuss her progress with her obesity treatment plan. She is on the the Category 2 Plan and states she is following her eating plan approximately 75 % of the time. She states she is exercising 30-45 minutes 3 times per week.  Interval History:  Since last office visit she is down 5 lb She did add in weight training 3 x a week and is walking more She feels adequate satiety on Zepbound 5 mg weekly but did notice more food noise due to stressors Her total weight loss is 33 lb which is a 13.6% TBW loss in 5 mos She has a good amount of volume restriction at mealtime on Zepbound Her family is supportive She has greatly reduced snacking  Pharmacotherapy: Zepbound 5 mg weekly (cash pay)  PHYSICAL EXAM:  Blood pressure 107/72, pulse 73, temperature 98.3 F (36.8 C), height 5\' 5"  (1.651 m), weight 208 lb (94.3 kg), SpO2 100%. Body mass index is 34.61 kg/m.  General: She is overweight, cooperative, alert, well developed, and in no acute distress. PSYCH: Has normal mood, affect and thought process.   Lungs: Normal breathing effort, no conversational dyspnea.   ASSESSMENT AND PLAN  TREATMENT PLAN FOR OBESITY:  Recommended Dietary Goals  Caitlin [name] is currently in the action stage of change. As such, her goal is to continue weight management plan. She has agreed to the Category 2 Plan and keeping a food journal and adhering to recommended goals of 1400 calories and 85 g of  protein.  Behavioral Intervention  We discussed the following  Behavioral Modification Strategies today: increasing lean protein intake to established goals, increasing fiber rich foods, avoiding skipping meals, increasing water intake , work on meal planning and preparation, work on Counselling psychologist calories using tracking application, keeping healthy foods at home, practice mindfulness eating and understand the difference between hunger signals and cravings, work on managing stress, creating time for self-care and relaxation, avoiding temptations and identifying enticing environmental cues, and continue to work on maintaining a reduced calorie state, getting the recommended amount of protein, incorporating whole foods, making healthy choices, staying well hydrated and practicing mindfulness when eating..  Additional resources provided today: NA  Recommended Physical Activity Goals  Caitlin [name] has been advised to work up to 150 minutes of moderate intensity aerobic activity a week and strengthening exercises 2-3 times per week for cardiovascular health, weight loss maintenance and preservation of muscle mass.   She has agreed to Increase the intensity, frequency or duration of strengthening exercises  and Increase the intensity, frequency or duration of aerobic exercises    Pharmacotherapy changes for the treatment of obesity: none  ASSOCIATED CONDITIONS ADDRESSED TODAY  Insulin resistance -     Insulin, random  Class 1 obesity due to excess calories with body mass index (BMI) of 34.0 to 34.9 in adult, unspecified whether serious comorbidity present -     Tirzepatide-Weight Management; Inject 5 mg into the skin once a week.  Dispense: 2 mL; Refill: 0  Weight loss -     Comprehensive metabolic panel  Vitamin D deficiency Assessment & Plan: Last vitamin D Lab Results  Component Value Date   VD25OH 24.3 (L) 02/04/2021   She is doing well on OTC vitamin D daily (5,000 international units  daily) Repeat lab today  Orders: -     VITAMIN D 25  Hydroxy (Vit-D Deficiency, Fractures)  Pre-diabetes Assessment & Plan: Lab Results  Component Value Date   HGBA1C 5.5 02/04/2021   Due for A1c today Hx of prediabetes in chart Doing great with healthy lifestyle changes and weight reduction  Orders: -     Hemoglobin A1c      She was informed of the importance of frequent follow up visits to maximize her success with intensive lifestyle modifications for her multiple health conditions.   ATTESTASTION STATEMENTS:  Reviewed by clinician on day of visit: allergies, medications, problem list, medical history, surgical history, family history, social history, and previous encounter notes pertinent to obesity diagnosis.   I have personally spent 30 minutes total time today in preparation, patient care, nutritional counseling and documentation for this visit, including the following: review of clinical lab tests; review of medical tests/procedures/services.      Caitlin Brink, DO DABFM, DABOM [facility] [address] [place], [place] [address] [phone]

## 2024-02-10 NOTE — Progress Notes (Unsigned)
NEUROLOGY FOLLOW UP OFFICE NOTE  Caitlin [name] [id number]  Assessment/Plan:   Idiopathic intracranial hypertension    ***  Subjective:  Caitlin [name] is a 53 year old right-handed female who follows up for migraines and mild idiopathic intracranial hypertension   UPDATE: Current NSAIDs/analgesics:  ibuprofen Current preventative: none  Off furosemide since May 2024.  ***    HISTORY: She has only had one dull headache since starting the topiramate, when it was snowing, and lasted a couple of hours and didn't require any medication.  Pulsating sensation has decreased significantly.  She may hear it once in a while.  If she stands up quickly or bends over, she may get the head pressure briefly.  She saw Dr. Dione Booze again and showed minimal improvement in optic nerve but overall stable     HISTORY: She has longstanding history of migraine presenting with visual aura of wavy lines followed by blind spot preceding headache.  Several years ago, she was started on Mirena and developed bilateral pulsatile tinnitus.  She began having new headaches in the Fall of 2020.  They are severe right occipital shooting pain associated with nausea, photophobia, and phonophobia but not associated with speech disturbance, dizziness, numbness or weakness.  She treats with ibuprofen.  In April 2021, she had a cough that triggered a particularly severe occipital headache.  It was aggravated by change in position, either standing up or bending over.  The severe headache subsided but she had a persistent dull right occipital pressure that lasted 2 weeks.  Other than her typical headaches, she has not had a recurrence of that occipital headache since then.  CT of head on 04/26/2020 was unremarkable.  She had an MRI of brain and internal auditory canals on 06/05/2020 which was normal.  She saw her ophthalmologist, Dr. 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  Visual fields were full.   Repeat exam on 07/05/2020 was stable.  She denies visual obscurations.  She underwent workup for pulsatile tinnitus and idiopathic intracranial hypertension.  TSH from 05/12/2020 was 0.60.   MRA of head and neck on 08/29/2020 was normal.  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  She underwent LP on 09/06/2020 which demonstrated opening pressure of 23 cm water and closing pressure of 17 cm water.    Did not tolerate acetazolamide or topiramate.  Started on furosemide.  Repeat eye exam with Dr. Dione Booze in March 2024 was negative for papilledema and with stable VF/OCT.  She stopped furosemide in May 2024  Past medications:  topiramate (hair loss), acetazolamide (side effects), furosemide 20mg  daily  PAST MEDICAL HISTORY: Past Medical History:  Diagnosis Date   Anxiety    Back pain    Breast cancer (HCC)    Depression    Edema of both lower extremities    Elevated cholesterol    Endometriosis    Endometriosis    Family history of kidney cancer    Family history of ovarian cancer    Fatty liver    Gallbladder problem    High blood pressure    High cholesterol    History of IBS    IBS (irritable bowel syndrome)    Infertility, female    Joint pain    Personal history of radiation therapy    Vitamin D deficiency     MEDICATIONS: Current Outpatient Medications on File Prior to Visit  Medication Sig Dispense  Refill   Cholecalciferol 100 MCG (4000 UT) CAPS 4000 units every 24 hours by oral route.     furosemide (LASIX) 20 MG tablet TAKE ONE TABLET BY MOUTH DAILY 30 tablet 0   goserelin (ZOLADEX) 3.6 MG injection Inject 3.6 mg into the skin every 28 (twenty-eight) days.     HYZAAR 50-12.5 MG tablet Take 1 tablet by mouth daily.     letrozole (FEMARA) 2.5 MG tablet Take 1 tablet (2.5 mg total) by mouth daily. 90 tablet 3   methylPREDNISolone (MEDROL DOSEPAK) 4 MG TBPK tablet Taper 6,5,4,3,2,1 21 tablet 0    tirzepatide 5 MG/0.5ML injection vial Inject 5 mg into the skin once a week. 2 mL 0   traMADol (ULTRAM) 50 MG tablet Take 1 tablet (50 mg total) by mouth every 6 (six) hours as needed. 30 tablet 0   WELLBUTRIN XL 150 MG 24 hr tablet Take 150 mg by mouth daily.     No current facility-administered medications on file prior to visit.    ALLERGIES: Allergies  Allergen Reactions   Ciprofloxacin Other (See Comments)    "achy joints" "achy joints"    Sulfa Antibiotics Rash    FAMILY HISTORY: Family History  Problem Relation Age of Onset   Sleep apnea Mother    Thyroid disease Mother    Depression Mother    Anxiety disorder Mother    Obesity Mother    Hyperlipidemia Father    Hypertension Father    Heart disease Father    Kidney disease Father    Sleep apnea Father    Heart disease Maternal Grandmother    COPD Maternal Grandmother    Heart disease Maternal Grandfather    Ovarian cancer Paternal Grandmother        dx in her 16s   Heart disease Maternal Aunt    Diabetes Maternal Aunt    Kidney cancer Maternal Aunt 58   Rheum arthritis Paternal Aunt    Cancer Cousin        unknown cancer in mat first cousin      Objective:  *** General: No acute distress.  Patient appears well-groomed.   ***   Caitlin Millet, DO  CC: Caitlin Coombes, MD

## 2024-02-11 ENCOUNTER — Other Ambulatory Visit: Payer: Self-pay | Admitting: Family Medicine

## 2024-02-11 ENCOUNTER — Encounter: Payer: Self-pay | Admitting: Neurology

## 2024-02-11 ENCOUNTER — Ambulatory Visit (INDEPENDENT_AMBULATORY_CARE_PROVIDER_SITE_OTHER): Payer: BC Managed Care – PPO | Admitting: Neurology

## 2024-02-11 VITALS — BP 106/74 | HR 85 | Ht 65.0 in | Wt 215.0 lb

## 2024-02-11 DIAGNOSIS — G43109 Migraine with aura, not intractable, without status migrainosus: Secondary | ICD-10-CM | POA: Diagnosis not present

## 2024-02-11 DIAGNOSIS — G932 Benign intracranial hypertension: Secondary | ICD-10-CM

## 2024-02-11 LAB — COMPREHENSIVE METABOLIC PANEL
ALT: 22 [IU]/L (ref 0–32)
AST: 22 [IU]/L (ref 0–40)
Albumin: 4.2 g/dL (ref 3.9–4.9)
Alkaline Phosphatase: 102 [IU]/L (ref 44–121)
BUN/Creatinine Ratio: 25 — ABNORMAL HIGH (ref 9–23)
BUN: 18 mg/dL (ref 6–24)
Bilirubin Total: 0.4 mg/dL (ref 0.0–1.2)
CO2: 27 mmol/L (ref 20–29)
Calcium: 9.9 mg/dL (ref 8.7–10.2)
Chloride: 99 mmol/L (ref 96–106)
Creatinine, Ser: 0.73 mg/dL (ref 0.57–1.00)
Globulin, Total: 2.9 g/dL (ref 1.5–4.5)
Glucose: 97 mg/dL (ref 70–99)
Potassium: 3.7 mmol/L (ref 3.5–5.2)
Sodium: 140 mmol/L (ref 134–144)
Total Protein: 7.1 g/dL (ref 6.0–8.5)
eGFR: 100 mL/min/{1.73_m2} (ref >59)

## 2024-02-11 LAB — HEMOGLOBIN A1C
Est. average glucose Bld gHb Est-mCnc: 114 mg/dL
Hgb A1c MFr Bld: 5.6 % (ref 4.8–5.6)

## 2024-02-11 LAB — VITAMIN D 25 HYDROXY (VIT D DEFICIENCY, FRACTURES): Vit D, 25-Hydroxy: 53 ng/mL (ref 30.0–100.0)

## 2024-02-11 LAB — INSULIN, RANDOM: INSULIN: 20.6 u[IU]/mL (ref 2.6–24.9)

## 2024-02-12 DIAGNOSIS — F4321 Adjustment disorder with depressed mood: Secondary | ICD-10-CM | POA: Diagnosis not present

## 2024-02-27 ENCOUNTER — Other Ambulatory Visit: Payer: Self-pay | Admitting: Family Medicine

## 2024-02-27 DIAGNOSIS — E66811 Obesity, class 1: Secondary | ICD-10-CM

## 2024-02-29 ENCOUNTER — Encounter: Payer: Self-pay | Admitting: Hematology and Oncology

## 2024-02-29 ENCOUNTER — Other Ambulatory Visit: Payer: Self-pay | Admitting: *Deleted

## 2024-02-29 DIAGNOSIS — C50412 Malignant neoplasm of upper-outer quadrant of left female breast: Secondary | ICD-10-CM

## 2024-02-29 DIAGNOSIS — N958 Other specified menopausal and perimenopausal disorders: Secondary | ICD-10-CM | POA: Diagnosis not present

## 2024-02-29 DIAGNOSIS — Z01419 Encounter for gynecological examination (general) (routine) without abnormal findings: Secondary | ICD-10-CM | POA: Diagnosis not present

## 2024-02-29 DIAGNOSIS — I890 Lymphedema, not elsewhere classified: Secondary | ICD-10-CM

## 2024-02-29 DIAGNOSIS — Z1330 Encounter for screening examination for mental health and behavioral disorders, unspecified: Secondary | ICD-10-CM | POA: Diagnosis not present

## 2024-03-02 ENCOUNTER — Ambulatory Visit (INDEPENDENT_AMBULATORY_CARE_PROVIDER_SITE_OTHER): Payer: BC Managed Care – PPO | Admitting: Family Medicine

## 2024-03-02 ENCOUNTER — Encounter: Payer: Self-pay | Admitting: Family Medicine

## 2024-03-02 VITALS — BP 116/79 | HR 81 | Temp 98.0°F | Ht 65.0 in | Wt 207.0 lb

## 2024-03-02 DIAGNOSIS — E88819 Insulin resistance, unspecified: Secondary | ICD-10-CM

## 2024-03-02 DIAGNOSIS — E6609 Other obesity due to excess calories: Secondary | ICD-10-CM

## 2024-03-02 DIAGNOSIS — E559 Vitamin D deficiency, unspecified: Secondary | ICD-10-CM | POA: Diagnosis not present

## 2024-03-02 DIAGNOSIS — E66811 Obesity, class 1: Secondary | ICD-10-CM

## 2024-03-02 DIAGNOSIS — Z6834 Body mass index (BMI) 34.0-34.9, adult: Secondary | ICD-10-CM | POA: Diagnosis not present

## 2024-03-02 MED ORDER — TIRZEPATIDE-WEIGHT MANAGEMENT 7.5 MG/0.5ML ~~LOC~~ SOAJ: 7.5000 mg | SUBCUTANEOUS | 0 refills | Status: DC

## 2024-03-02 NOTE — Progress Notes (Signed)
 Office: [phone]  /  Fax: [phone]  WEIGHT SUMMARY AND BIOMETRICS  Starting Date: 08/26/23  Starting Weight: 241lb   Weight Lost Since Last Visit: 1lb   Vitals Temp: 98 F (36.7 C) BP: 116/79 Pulse Rate: 81 SpO2: 98 %   Body Composition  Body Fat %: 43.5 % Fat Mass (lbs): 90.2 lbs Muscle Mass (lbs): 111.2 lbs Total Body Water (lbs): 78.8 lbs Visceral Fat Rating : 11    HPI  Chief Complaint: OBESITY  Caitlin [name] is here to discuss her progress with her obesity treatment plan. She is on the the Category 2 Plan and states she is following her eating plan approximately 80 % of the time. She states she is exercising 30-60 minutes 3 times per week.  Interval History:  Since last office visit she is down 1 lb She is on Zepbound 5 mg weekly She is averaging 5,000 steps most days She is doing weight lifting classes 3 days/ wk She has a net weight loss of 34 lb in the past 6 mo This is a 14% TBW loss using Zepbound + reduced kcal diet She is up 0.4 lb of muscle mass and is down 1.8 lb of body fat She is feeling less satiety laely She denies adverse side effect from Zepbound  Pharmacotherapy: Zepbound   PHYSICAL EXAM:  Blood pressure 116/79, pulse 81, temperature 98 F (36.7 C), height 5\' 5"  (1.651 m), weight 207 lb (93.9 kg), SpO2 98%. Body mass index is 34.45 kg/m.  General: She is overweight, cooperative, alert, well developed, and in no acute distress. PSYCH: Has normal mood, affect and thought process.   Lungs: Normal breathing effort, no conversational dyspnea.   ASSESSMENT AND PLAN  TREATMENT PLAN FOR OBESITY:  Recommended Dietary Goals  Eliani is currently in the action stage of change. As such, her goal is to continue weight management plan. She has agreed to the Category 2 Plan.  Behavioral Intervention  We discussed the following Behavioral Modification Strategies today: increasing lean protein intake to established goals, increasing  fiber rich foods, increasing water intake , work on meal planning and preparation, keeping healthy foods at home, work on managing stress, creating time for self-care and relaxation, avoiding temptations and identifying enticing environmental cues, planning for success, and continue to work on maintaining a reduced calorie state, getting the recommended amount of protein, incorporating whole foods, making healthy choices, staying well hydrated and practicing mindfulness when eating..  Additional resources provided today: NA  Recommended Physical Activity Goals  Dorlis has been advised to work up to 150 minutes of moderate intensity aerobic activity a week and strengthening exercises 2-3 times per week for cardiovascular health, weight loss maintenance and preservation of muscle mass.   She has agreed to Increase the intensity, frequency or duration of strengthening exercises  and Increase the intensity, frequency or duration of aerobic exercises    Pharmacotherapy changes for the treatment of obesity: Increase Zepbound to 7.5 mg once weekly injection  ASSOCIATED CONDITIONS ADDRESSED TODAY  Insulin resistance Fasting insulin is improving.  She is down from 24.4 -20.9 with a 14% total body weight loss in the past 6 months a medically supervised weight management.  She has done well reducing intake of starches and sweets and adding in regular exercise that include both cardio and resistance training.  In addition, she has done well on Zepbound for obesity treatment.  Continue to work on healthy dietary changes, regular exercise and weight loss.  Class 1 obesity due to  excess calories with body mass index (BMI) of 34.0 to 34.9 in adult, unspecified whether serious comorbidity present -     Tirzepatide-Weight Management; Inject 7.5 mg into the skin once a week.  Dispense: 2 mL; Refill: 0  Vitamin D deficiency Improving Reviewed lab results with patient from last visit.  Her vitamin D level  improved to 53 on vitamin D 50,000 IU once weekly.  Energy level has improved.  She will finish out her current prescription for vitamin D 50,000 IU once weekly then transition over to vitamin D3 2000 IU once daily for maintenance.     She was informed of the importance of frequent follow up visits to maximize her success with intensive lifestyle modifications for her multiple health conditions.   ATTESTASTION STATEMENTS:  Reviewed by clinician on day of visit: allergies, medications, problem list, medical history, surgical history, family history, social history, and previous encounter notes pertinent to obesity diagnosis.   I have personally spent 30 minutes total time today in preparation, patient care, nutritional counseling and documentation for this visit, including the following: review of clinical lab tests; review of medical tests/procedures/services.      [name], DO DABFM, DABOM [facility] [address] [place], [place] [address] [phone]

## 2024-03-04 DIAGNOSIS — F4321 Adjustment disorder with depressed mood: Secondary | ICD-10-CM | POA: Diagnosis not present

## 2024-03-07 ENCOUNTER — Inpatient Hospital Stay: Payer: BC Managed Care – PPO | Attending: Hematology and Oncology

## 2024-03-07 VITALS — BP 107/71 | HR 83 | Temp 98.3°F | Resp 16

## 2024-03-07 DIAGNOSIS — C50412 Malignant neoplasm of upper-outer quadrant of left female breast: Secondary | ICD-10-CM | POA: Insufficient documentation

## 2024-03-07 DIAGNOSIS — Z5111 Encounter for antineoplastic chemotherapy: Secondary | ICD-10-CM | POA: Insufficient documentation

## 2024-03-07 MED ORDER — GOSERELIN ACETATE 3.6 MG ~~LOC~~ IMPL
3.6000 mg | DRUG_IMPLANT | SUBCUTANEOUS | Status: DC
  Administered 2024-03-07: 3.6 mg via SUBCUTANEOUS
  Filled 2024-03-07: qty 3.6

## 2024-03-09 DIAGNOSIS — H4711 Papilledema associated with increased intracranial pressure: Secondary | ICD-10-CM | POA: Diagnosis not present

## 2024-03-09 DIAGNOSIS — H04123 Dry eye syndrome of bilateral lacrimal glands: Secondary | ICD-10-CM | POA: Diagnosis not present

## 2024-03-09 DIAGNOSIS — H47323 Drusen of optic disc, bilateral: Secondary | ICD-10-CM | POA: Diagnosis not present

## 2024-03-09 DIAGNOSIS — H1045 Other chronic allergic conjunctivitis: Secondary | ICD-10-CM | POA: Diagnosis not present

## 2024-03-09 NOTE — Therapy (Signed)
 OUTPATIENT PHYSICAL THERAPY  UPPER EXTREMITY ONCOLOGY EVALUATION  Patient Name: Caitlin [name] MRN: [id number] DOB:1971/10/13, 53 y.o., female Today's Date: 03/10/2024  END OF SESSION:  PT End of Session - 03/10/24 0958     Visit Number 1    Number of Visits 5    Date for PT Re-Evaluation 04/14/24    Authorization Type auth required    PT Start Time 0900    PT Stop Time 0951    PT Time Calculation (min) 51 min    Activity Tolerance Patient tolerated treatment well    Behavior During Therapy WFL for tasks assessed/performed             Past Medical History:  Diagnosis Date   Anxiety    Back pain    Breast cancer (HCC)    Depression    Edema of both lower extremities    Elevated cholesterol    Endometriosis    Endometriosis    Family history of kidney cancer    Family history of ovarian cancer    Fatty liver    Gallbladder problem    High blood pressure    High cholesterol    History of IBS    IBS (irritable bowel syndrome)    Infertility, female    Joint pain    Personal history of radiation therapy    Vitamin D deficiency    Past Surgical History:  Procedure Laterality Date   BREAST LUMPECTOMY     BREAST LUMPECTOMY WITH RADIOACTIVE SEED AND SENTINEL LYMPH NODE BIOPSY Left 04/22/2022   Procedure: LEFT BREAST LUMPECTOMY WITH RADIOACTIVE SEED;  Surgeon: Abigail Miyamoto, MD;  Location: [facility] OR;  Service: General;  Laterality: Left;  LMA   CHOLECYSTECTOMY  2018   LAPAROSCOPY     SENTINEL NODE BIOPSY Left 04/22/2022   Procedure: LEFT SENTINEL LYMPH NODE BIOPSY;  Surgeon: Abigail Miyamoto, MD;  Location: [facility] OR;  Service: General;  Laterality: Left;   Patient Active Problem List   Diagnosis Date Noted   Sedentary lifestyle 11/19/2023   Heartburn 10/26/2023   Insulin resistance 09/09/2023   Polyphagia 09/09/2023   Other fatigue 08/26/2023   SOBOE (shortness of breath on exertion) 08/26/2023   History of breast cancer 08/26/2023   Essential hypertension  08/26/2023   Plantar fasciitis 08/26/2023   Depression 08/26/2023   Depression screen 08/26/2023   Pre-diabetes 08/26/2023   Vitamin D deficiency 08/26/2023   Other hyperlipidemia 08/26/2023   BMI 40.0-44.9, adult (HCC) 08/26/2023   Morbid obesity (HCC) with starting BMI 40 08/26/2023   Elevated blood-pressure reading without diagnosis of hypertension 03/26/2023   Family history of colonic polyps 10/22/2022   Genetic testing 04/29/2022   Family history of ovarian cancer 04/15/2022   Family history of kidney cancer 04/15/2022   Malignant neoplasm of upper-outer quadrant of left breast in female, estrogen receptor positive (HCC) 04/02/2022   Endometriosis 02/20/2022   Vitamin D insufficiency 02/18/2021   Elevated cholesterol 02/18/2021   Class 1 obesity due to excess calories with body mass index (BMI) of 34.0 to 34.9 in adult 02/07/2021   Irritable bowel syndrome 02/07/2021   Benign intracranial hypertension 09/10/2020    PCP: [name], MD  REFERRING PROVIDER: Rachel Moulds, MD  REFERRING DIAG: Lt breast lymphedema   THERAPY DIAG:  Aftercare following surgery for neoplasm  Malignant neoplasm of upper-outer quadrant of left breast in female, estrogen receptor positive (HCC)  Lymphedema, not elsewhere classified  ONSET DATE: 03/29/22  Rationale for Evaluation and Treatment: Rehabilitation  SUBJECTIVE:                                                                                                                                                                                           SUBJECTIVE STATEMENT: I had a flare up.  It was worse last week and a bit better now.  The discomfort is more on the side.  It was more hard and tight.  I was using the pump about 2x per week.  Its more of a heavy achiness.  It started to hurt the week I increased my weight.  I was doing fly movements with 12#.    PERTINENT HISTORY: Patient was diagnosed with left IDC. ER/PR positive, HER2  negative KI67 less than 5%. Pt had left lumpectomy and SLNB on 04/22/22 with Dr. Magnus Ivan. Radiation. No chemotherapy.  Radiation completed 07/09/22. Starting tamoxifen.   PAIN:  Are you having pain? No NPRS scale: up to 4/10 Pain location: Left breast  Pain orientation: Left  PAIN TYPE: aching, dull, and tight Pain description: intermittent  Aggravating factors: It hurts with movements where it feels compressed  Relieving factors: doing more massage.    PRECAUTIONS: Lt lymphedema risk   RED FLAGS: None   WEIGHT BEARING RESTRICTIONS: No  FALLS:  Has patient fallen in last 6 months? No  LIVING ENVIRONMENT: Lives with: lives with their family - husband and 3 kids Lives in: House/apartment  OCCUPATION: Programmer, multimedia  LEISURE: walking  HAND DOMINANCE: right   PRIOR LEVEL OF FUNCTION: Independent  PATIENT GOALS: get the breast back to feeling like it usually does    OBJECTIVE: Note: Objective measures were completed at Evaluation unless otherwise noted.  COGNITION: Overall cognitive status: Within functional limits for tasks assessed   PALPATION: Mild fibrosis medial breast, +1 ttp lateral breast near incision with more deep palpation  OBSERVATIONS / OTHER ASSESSMENTS: Lt breast generally larger than the Rt - seems unchanged in size from previous visits.  Fibrosis seems less.    UPPER EXTREMITY AROM/PROM: WNL  UPPER EXTREMITY STRENGTH: WNL   BREAST COMPLAINTS QUESTIONNAIRE Pain:   4 Heaviness:  6 Swollen feeling: 7 Tense Skin:  3 Redness:  3 Bra Print:  8 Size of Pores:  6 Hard feeling:   7 Total:   44  /80 A Score over 9 indicates lymphedema issues in the breast] Edema questionnaire: 51/80 on 07/24/22, 46/80 on 09/30/22  TREATMENT DATE:  03/10/24 Eval performed briefly for status check In supine: Short neck, 5 diaphragmatic breaths, bil axillary  nodes and establishment of interaxillary pathway, L inguinal nodes and establishment of axilloinguinal pathway, then L breast moving fluid towards pathways spending extra time in any areas of fibrosis then retracing all steps. Then in sidelying for posterior interaxillary work.     PATIENT EDUCATION:  Education details: POC Person educated: Patient Education method: Explanation Education comprehension: verbalized understanding  HOME EXERCISE PROGRAM: Continue self MLD or pump  ASSESSMENT:  CLINICAL IMPRESSION: Patient is a 53 y.o. female who was seen today for physical therapy evaluation and treatment for her continued Lt breast edema after breast cancer treatment.  She is known to this clinic and was managing well with her pump and self MLD until she increased her weight in gym class from 10# to 12#. It is feeling better already but we will continue x 4 weeks to maximize return to prior level.    OBJECTIVE IMPAIRMENTS: increased fascial restrictions and pain.   ACTIVITY LIMITATIONS: sleeping  PARTICIPATION LIMITATIONS: none  PERSONAL FACTORS: 1-2 comorbidities: SLNB, radiation hx  are also affecting patient's functional outcome.   REHAB POTENTIAL: Excellent  CLINICAL DECISION MAKING: Stable/uncomplicated  EVALUATION COMPLEXITY: Low  GOALS: Goals reviewed with patient? Yes  SHORT TERM GOALS=LTGs: Target date: 04/14/24  Pt will report return to prior level of no increased breast pain with daily movements Baseline: Goal status: INITIAL  2.  Pt will return to pump and self MLD for home maintenance Baseline:  Goal status: INITIAL   PLAN:  PT FREQUENCY: 1x/week  PT DURATION: 4 weeks  PLANNED INTERVENTIONS: 97110-Therapeutic exercises, [address]35- Self Care, [address]- Manual therapy, Patient/Family education, Taping, Therapeutic exercises, and Self Care  PLAN FOR NEXT SESSION: Lt breast MLD and any STM as needed  Idamae Lusher, PT 03/10/2024, 9:59 AM

## 2024-03-10 ENCOUNTER — Ambulatory Visit: Attending: Hematology and Oncology | Admitting: Rehabilitation

## 2024-03-10 ENCOUNTER — Encounter: Payer: Self-pay | Admitting: Rehabilitation

## 2024-03-10 ENCOUNTER — Other Ambulatory Visit: Payer: Self-pay

## 2024-03-10 DIAGNOSIS — Z483 Aftercare following surgery for neoplasm: Secondary | ICD-10-CM | POA: Insufficient documentation

## 2024-03-10 DIAGNOSIS — Z853 Personal history of malignant neoplasm of breast: Secondary | ICD-10-CM | POA: Insufficient documentation

## 2024-03-10 DIAGNOSIS — C50412 Malignant neoplasm of upper-outer quadrant of left female breast: Secondary | ICD-10-CM

## 2024-03-10 DIAGNOSIS — I890 Lymphedema, not elsewhere classified: Secondary | ICD-10-CM | POA: Diagnosis not present

## 2024-03-14 ENCOUNTER — Ambulatory Visit
Admission: RE | Admit: 2024-03-14 | Discharge: 2024-03-14 | Disposition: A | Discharge status: Home / Self Care | Payer: BC Managed Care – PPO | Source: Ambulatory Visit | Attending: Hematology and Oncology

## 2024-03-14 DIAGNOSIS — Z853 Personal history of malignant neoplasm of breast: Secondary | ICD-10-CM | POA: Diagnosis not present

## 2024-03-14 DIAGNOSIS — C50412 Malignant neoplasm of upper-outer quadrant of left female breast: Secondary | ICD-10-CM

## 2024-03-14 DIAGNOSIS — Z08 Encounter for follow-up examination after completed treatment for malignant neoplasm: Secondary | ICD-10-CM | POA: Diagnosis not present

## 2024-03-17 ENCOUNTER — Ambulatory Visit: Admitting: Rehabilitation

## 2024-03-17 ENCOUNTER — Encounter: Payer: Self-pay | Admitting: Rehabilitation

## 2024-03-17 DIAGNOSIS — Z853 Personal history of malignant neoplasm of breast: Secondary | ICD-10-CM | POA: Diagnosis not present

## 2024-03-17 DIAGNOSIS — Z483 Aftercare following surgery for neoplasm: Secondary | ICD-10-CM | POA: Diagnosis not present

## 2024-03-17 DIAGNOSIS — Z170 Estrogen receptor positive status [ER+]: Secondary | ICD-10-CM

## 2024-03-17 DIAGNOSIS — I890 Lymphedema, not elsewhere classified: Secondary | ICD-10-CM

## 2024-03-17 DIAGNOSIS — R293 Abnormal posture: Secondary | ICD-10-CM

## 2024-03-17 NOTE — Therapy (Signed)
 OUTPATIENT PHYSICAL THERAPY  UPPER EXTREMITY ONCOLOGY   Patient Name: Caitlin [name] MRN: [id number] DOB:[birth year], 53 y.o., female Today's Date: 03/17/2024  END OF SESSION:  PT End of Session - 03/17/24 0858     Visit Number 2    Number of Visits 5    Date for PT Re-Evaluation 04/14/24    PT Start Time 0900    PT Stop Time 0945    PT Time Calculation (min) 45 min    Activity Tolerance Patient tolerated treatment well    Behavior During Therapy WFL for tasks assessed/performed             Past Medical History:  Diagnosis Date   Anxiety    Back pain    Breast cancer (HCC)    Depression    Edema of both lower extremities    Elevated cholesterol    Endometriosis    Endometriosis    Family history of kidney cancer    Family history of ovarian cancer    Fatty liver    Gallbladder problem    High blood pressure    High cholesterol    History of IBS    IBS (irritable bowel syndrome)    Infertility, female    Joint pain    Personal history of radiation therapy    Vitamin D deficiency    Past Surgical History:  Procedure Laterality Date   BREAST LUMPECTOMY     BREAST LUMPECTOMY WITH RADIOACTIVE SEED AND SENTINEL LYMPH NODE BIOPSY Left 04/22/2022   Procedure: LEFT BREAST LUMPECTOMY WITH RADIOACTIVE SEED;  Surgeon: Abigail Miyamoto, MD;  Location: [facility] OR;  Service: General;  Laterality: Left;  LMA   CHOLECYSTECTOMY  2018   LAPAROSCOPY     SENTINEL NODE BIOPSY Left 04/22/2022   Procedure: LEFT SENTINEL LYMPH NODE BIOPSY;  Surgeon: Abigail Miyamoto, MD;  Location: [facility] OR;  Service: General;  Laterality: Left;   Patient Active Problem List   Diagnosis Date Noted   Sedentary lifestyle 11/19/2023   Heartburn 10/26/2023   Insulin resistance 09/09/2023   Polyphagia 09/09/2023   Other fatigue 08/26/2023   SOBOE (shortness of breath on exertion) 08/26/2023   History of breast cancer 08/26/2023   Essential hypertension 08/26/2023   Plantar fasciitis 08/26/2023    Depression 08/26/2023   Depression screen 08/26/2023   Pre-diabetes 08/26/2023   Vitamin D deficiency 08/26/2023   Other hyperlipidemia 08/26/2023   BMI 40.0-44.9, adult (HCC) 08/26/2023   Morbid obesity (HCC) with starting BMI 40 08/26/2023   Elevated blood-pressure reading without diagnosis of hypertension 03/26/2023   Family history of colonic polyps 10/22/2022   Genetic testing 04/29/2022   Family history of ovarian cancer 04/15/2022   Family history of kidney cancer 04/15/2022   Malignant neoplasm of upper-outer quadrant of left breast in female, estrogen receptor positive (HCC) 04/02/2022   Endometriosis 02/20/2022   Vitamin D insufficiency 02/18/2021   Elevated cholesterol 02/18/2021   Class 1 obesity due to excess calories with body mass index (BMI) of 34.0 to 34.9 in adult 02/07/2021   Irritable bowel syndrome 02/07/2021   Benign intracranial hypertension 09/10/2020    PCP: [name], MD  REFERRING PROVIDER: Rachel Moulds, MD  REFERRING DIAG: Lt breast lymphedema   THERAPY DIAG:  Aftercare following surgery for neoplasm  Malignant neoplasm of upper-outer quadrant of left breast in female, estrogen receptor positive (HCC)  Lymphedema, not elsewhere classified  Abnormal posture  ONSET DATE: 03/29/22  Rationale for Evaluation and Treatment: Rehabilitation  SUBJECTIVE:  SUBJECTIVE STATEMENT: I had my mammogram.  It was normal but it did hurt.   EVAL; I had a flare up.  It was worse last week and a bit better now.  The discomfort is more on the side.  It was more hard and tight.  I was using the pump about 2x per week.  Its more of a heavy achiness.  It started to hurt the week I increased my weight.  I was doing fly movements with 12#.    PERTINENT HISTORY: Patient was diagnosed  with left IDC. ER/PR positive, HER2 negative KI67 less than 5%. Pt had left lumpectomy and SLNB on 04/22/22 with Dr. Magnus Ivan. Radiation. No chemotherapy.  Radiation completed 07/09/22. Starting tamoxifen.   PAIN:  Are you having pain? No NPRS scale: up to 4/10 Pain location: Left breast  Pain orientation: Left  PAIN TYPE: aching, dull, and tight Pain description: intermittent  Aggravating factors: It hurts with movements where it feels compressed  Relieving factors: doing more massage.    PRECAUTIONS: Lt lymphedema risk   RED FLAGS: None   WEIGHT BEARING RESTRICTIONS: No  FALLS:  Has patient fallen in last 6 months? No  LIVING ENVIRONMENT: Lives with: lives with their family - husband and 3 kids Lives in: House/apartment  OCCUPATION: Programmer, multimedia  LEISURE: walking  HAND DOMINANCE: right   PRIOR LEVEL OF FUNCTION: Independent  PATIENT GOALS: get the breast back to feeling like it usually does    OBJECTIVE: Note: Objective measures were completed at Evaluation unless otherwise noted.  COGNITION: Overall cognitive status: Within functional limits for tasks assessed   PALPATION: Mild fibrosis medial breast, +1 ttp lateral breast near incision with more deep palpation  OBSERVATIONS / OTHER ASSESSMENTS: Lt breast generally larger than the Rt - seems unchanged in size from previous visits.  Fibrosis seems less.    UPPER EXTREMITY AROM/PROM: WNL  UPPER EXTREMITY STRENGTH: WNL   BREAST COMPLAINTS QUESTIONNAIRE Pain:   4 Heaviness:  6 Swollen feeling: 7 Tense Skin:  3 Redness:  3 Bra Print:  8 Size of Pores:  6 Hard feeling:   7 Total:   44  /80 A Score over 9 indicates lymphedema issues in the breast] Edema questionnaire: 51/80 on 07/24/22, 46/80 on 09/30/22                                                                                                                            TREATMENT DATE:  Pt permission and consent throughout each step of examination and  treatment with modification and draping if requested when working on sensitive areas  03/17/24 In supine: Short neck, 5 diaphragmatic breaths, bil axillary nodes and establishment of interaxillary pathway, L inguinal nodes and establishment of axilloinguinal pathway, then L breast moving fluid towards pathways spending extra time in any areas of fibrosis then retracing all steps. Then in sidelying for posterior interaxillary work.  03/10/24 Eval performed briefly for status check In supine: Short neck, 5  diaphragmatic breaths, bil axillary nodes and establishment of interaxillary pathway, L inguinal nodes and establishment of axilloinguinal pathway, then L breast moving fluid towards pathways spending extra time in any areas of fibrosis then retracing all steps. Then in sidelying for posterior interaxillary work.     PATIENT EDUCATION:  Education details: per today's note  Person educated: Patient Education method: Explanation Education comprehension: verbalized understanding  HOME EXERCISE PROGRAM: Continue self MLD or pump  ASSESSMENT:  CLINICAL IMPRESSION: Continued breast MLD - pt reports improvements post MT  OBJECTIVE IMPAIRMENTS: increased fascial restrictions and pain.   ACTIVITY LIMITATIONS: sleeping  PARTICIPATION LIMITATIONS: none  PERSONAL FACTORS: 1-2 comorbidities: SLNB, radiation hx  are also affecting patient's functional outcome.   REHAB POTENTIAL: Excellent  CLINICAL DECISION MAKING: Stable/uncomplicated  EVALUATION COMPLEXITY: Low  GOALS: Goals reviewed with patient? Yes  SHORT TERM GOALS=LTGs: Target date: 04/14/24  Pt will report return to prior level of no increased breast pain with daily movements Baseline: Goal status: INITIAL  2.  Pt will return to pump and self MLD for home maintenance Baseline:  Goal status: INITIAL   PLAN:  PT FREQUENCY: 1x/week  PT DURATION: 4 weeks  PLANNED INTERVENTIONS: 97110-Therapeutic exercises, 97535- Self  Care, 51884- Manual therapy, Patient/Family education, Taping, Therapeutic exercises, and Self Care  PLAN FOR NEXT SESSION: Lt breast MLD and any STM as needed  Idamae Lusher, PT 03/17/2024, 9:45 AM

## 2024-03-21 ENCOUNTER — Other Ambulatory Visit: Payer: Self-pay | Admitting: Family Medicine

## 2024-03-21 DIAGNOSIS — E6609 Other obesity due to excess calories: Secondary | ICD-10-CM

## 2024-03-23 DIAGNOSIS — F4321 Adjustment disorder with depressed mood: Secondary | ICD-10-CM | POA: Diagnosis not present

## 2024-03-24 ENCOUNTER — Ambulatory Visit: Admitting: Rehabilitation

## 2024-03-24 ENCOUNTER — Encounter: Payer: Self-pay | Admitting: Rehabilitation

## 2024-03-24 DIAGNOSIS — I890 Lymphedema, not elsewhere classified: Secondary | ICD-10-CM

## 2024-03-24 DIAGNOSIS — Z483 Aftercare following surgery for neoplasm: Secondary | ICD-10-CM

## 2024-03-24 DIAGNOSIS — Z853 Personal history of malignant neoplasm of breast: Secondary | ICD-10-CM | POA: Diagnosis not present

## 2024-03-24 DIAGNOSIS — R293 Abnormal posture: Secondary | ICD-10-CM

## 2024-03-24 DIAGNOSIS — Z170 Estrogen receptor positive status [ER+]: Secondary | ICD-10-CM

## 2024-03-24 NOTE — Therapy (Signed)
 OUTPATIENT PHYSICAL THERAPY  UPPER EXTREMITY ONCOLOGY   Patient Name: Caitlin [name] MRN: [id number] DOB:1971-01-04, 53 y.o., female Today's Date: 03/24/2024  END OF SESSION:  PT End of Session - 03/24/24 1002     Visit Number 3    Number of Visits 5    Date for PT Re-Evaluation 04/14/24    PT Start Time 0900    PT Stop Time 0950    PT Time Calculation (min) 50 min    Activity Tolerance Patient tolerated treatment well    Behavior During Therapy WFL for tasks assessed/performed              Past Medical History:  Diagnosis Date   Anxiety    Back pain    Breast cancer (HCC)    Depression    Edema of both lower extremities    Elevated cholesterol    Endometriosis    Endometriosis    Family history of kidney cancer    Family history of ovarian cancer    Fatty liver    Gallbladder problem    High blood pressure    High cholesterol    History of IBS    IBS (irritable bowel syndrome)    Infertility, female    Joint pain    Personal history of radiation therapy    Vitamin D deficiency    Past Surgical History:  Procedure Laterality Date   BREAST LUMPECTOMY     BREAST LUMPECTOMY WITH RADIOACTIVE SEED AND SENTINEL LYMPH NODE BIOPSY Left 04/22/2022   Procedure: LEFT BREAST LUMPECTOMY WITH RADIOACTIVE SEED;  Surgeon: Abigail Miyamoto, MD;  Location: [facility] OR;  Service: General;  Laterality: Left;  LMA   CHOLECYSTECTOMY  2018   LAPAROSCOPY     SENTINEL NODE BIOPSY Left 04/22/2022   Procedure: LEFT SENTINEL LYMPH NODE BIOPSY;  Surgeon: Abigail Miyamoto, MD;  Location: [facility] OR;  Service: General;  Laterality: Left;   Patient Active Problem List   Diagnosis Date Noted   Sedentary lifestyle 11/19/2023   Heartburn 10/26/2023   Insulin resistance 09/09/2023   Polyphagia 09/09/2023   Other fatigue 08/26/2023   SOBOE (shortness of breath on exertion) 08/26/2023   History of breast cancer 08/26/2023   Essential hypertension 08/26/2023   Plantar fasciitis 08/26/2023    Depression 08/26/2023   Depression screen 08/26/2023   Pre-diabetes 08/26/2023   Vitamin D deficiency 08/26/2023   Other hyperlipidemia 08/26/2023   BMI 40.0-44.9, adult (HCC) 08/26/2023   Morbid obesity (HCC) with starting BMI 40 08/26/2023   Elevated blood-pressure reading without diagnosis of hypertension 03/26/2023   Family history of colonic polyps 10/22/2022   Genetic testing 04/29/2022   Family history of ovarian cancer 04/15/2022   Family history of kidney cancer 04/15/2022   Malignant neoplasm of upper-outer quadrant of left breast in female, estrogen receptor positive (HCC) 04/02/2022   Endometriosis 02/20/2022   Vitamin D insufficiency 02/18/2021   Elevated cholesterol 02/18/2021   Class 1 obesity due to excess calories with body mass index (BMI) of 34.0 to 34.9 in adult 02/07/2021   Irritable bowel syndrome 02/07/2021   Benign intracranial hypertension 09/10/2020    PCP: [name], MD  REFERRING PROVIDER: Rachel Moulds, MD  REFERRING DIAG: Lt breast lymphedema   THERAPY DIAG:  Aftercare following surgery for neoplasm  Malignant neoplasm of upper-outer quadrant of left breast in female, estrogen receptor positive (HCC)  Abnormal posture  Lymphedema, not elsewhere classified  ONSET DATE: 03/29/22  Rationale for Evaluation and Treatment: Rehabilitation  SUBJECTIVE:  SUBJECTIVE STATEMENT:  It is feeling pretty back to normal now.  I even did chest work at [employer] and it was okay.   EVAL; I had a flare up.  It was worse last week and a bit better now.  The discomfort is more on the side.  It was more hard and tight.  I was using the pump about 2x per week.  Its more of a heavy achiness.  It started to hurt the week I increased my weight.  I was doing fly movements with 12#.     PERTINENT HISTORY: Patient was diagnosed with left IDC. ER/PR positive, HER2 negative KI67 less than 5%. Pt had left lumpectomy and SLNB on 04/22/22 with Dr. Magnus Ivan. Radiation. No chemotherapy.  Radiation completed 07/09/22. Starting tamoxifen.   PAIN:  Are you having pain? No NPRS scale: up to 4/10 Pain location: Left breast  Pain orientation: Left  PAIN TYPE: aching, dull, and tight Pain description: intermittent  Aggravating factors: It hurts with movements where it feels compressed  Relieving factors: doing more massage.    PRECAUTIONS: Lt lymphedema risk   RED FLAGS: None   WEIGHT BEARING RESTRICTIONS: No  FALLS:  Has patient fallen in last 6 months? No  LIVING ENVIRONMENT: Lives with: lives with their family - husband and 3 kids Lives in: House/apartment  OCCUPATION: Programmer, multimedia  LEISURE: walking  HAND DOMINANCE: right   PRIOR LEVEL OF FUNCTION: Independent  PATIENT GOALS: get the breast back to feeling like it usually does    OBJECTIVE: Note: Objective measures were completed at Evaluation unless otherwise noted.  COGNITION: Overall cognitive status: Within functional limits for tasks assessed   PALPATION: Mild fibrosis medial breast, +1 ttp lateral breast near incision with more deep palpation  OBSERVATIONS / OTHER ASSESSMENTS: Lt breast generally larger than the Rt - seems unchanged in size from previous visits.  Fibrosis seems less.    UPPER EXTREMITY AROM/PROM: WNL  UPPER EXTREMITY STRENGTH: WNL   BREAST COMPLAINTS QUESTIONNAIRE Pain:   4 Heaviness:  6 Swollen feeling: 7 Tense Skin:  3 Redness:  3 Bra Print:  8 Size of Pores:  6 Hard feeling:   7 Total:   44  /80 A Score over 9 indicates lymphedema issues in the breast] Edema questionnaire: 51/80 on 07/24/22, 46/80 on 09/30/22                                                                                                                            TREATMENT DATE:  Pt permission and consent  throughout each step of examination and treatment with modification and draping if requested when working on sensitive areas 03/24/24 In supine: Short neck, 5 diaphragmatic breaths, bil axillary nodes and establishment of interaxillary pathway, L inguinal nodes and establishment of axilloinguinal pathway, then L breast moving fluid towards pathways spending extra time in any areas of fibrosis then retracing all steps. Then in sidelying for posterior interaxillary work.  03/17/24 In supine:  Short neck, 5 diaphragmatic breaths, bil axillary nodes and establishment of interaxillary pathway, L inguinal nodes and establishment of axilloinguinal pathway, then L breast moving fluid towards pathways spending extra time in any areas of fibrosis then retracing all steps. Then in sidelying for posterior interaxillary work.  03/10/24 Eval performed briefly for status check In supine: Short neck, 5 diaphragmatic breaths, bil axillary nodes and establishment of interaxillary pathway, L inguinal nodes and establishment of axilloinguinal pathway, then L breast moving fluid towards pathways spending extra time in any areas of fibrosis then retracing all steps. Then in sidelying for posterior interaxillary work.     PATIENT EDUCATION:  Education details: per today's note  Person educated: Patient Education method: Explanation Education comprehension: verbalized understanding  HOME EXERCISE PROGRAM: Continue self MLD or pump  ASSESSMENT:  CLINICAL IMPRESSION: Ready for DC today with SOZO continued.   OBJECTIVE IMPAIRMENTS: increased fascial restrictions and pain.   ACTIVITY LIMITATIONS: sleeping  PARTICIPATION LIMITATIONS: none  PERSONAL FACTORS: 1-2 comorbidities: SLNB, radiation hx  are also affecting patient's functional outcome.   REHAB POTENTIAL: Excellent  CLINICAL DECISION MAKING: Stable/uncomplicated  EVALUATION COMPLEXITY: Low  GOALS: Goals reviewed with patient? Yes  SHORT TERM  GOALS=LTGs: Target date: 04/14/24  Pt will report return to prior level of no increased breast pain with daily movements Baseline: Goal status: MET  2.  Pt will return to pump and self MLD for home maintenance Baseline:  Goal status: MET   PLAN:  PT FREQUENCY: 1x/week  PT DURATION: 4 weeks  PLANNED INTERVENTIONS: 97110-Therapeutic exercises, 97535- Self Care, 04540- Manual therapy, Patient/Family education, Taping, Therapeutic exercises, and Self Care  PLAN FOR NEXT SESSION: Lt breast MLD and any STM as needed  Gopal Malter, Julieanne Manson, PT 03/24/2024, 10:03 AM  PHYSICAL THERAPY DISCHARGE SUMMARY  Visits from Start of Care: 3  Current functional level related to goals / functional outcomes: See above   Remaining deficits: Chronic Lt breast lymphedema    Education / Equipment: Self care  Plan: Patient agrees to discharge.  Patient is being discharged due to meeting the stated rehab goals.

## 2024-03-31 ENCOUNTER — Encounter: Admitting: Rehabilitation

## 2024-03-31 DIAGNOSIS — M9904 Segmental and somatic dysfunction of sacral region: Secondary | ICD-10-CM | POA: Diagnosis not present

## 2024-03-31 DIAGNOSIS — M9903 Segmental and somatic dysfunction of lumbar region: Secondary | ICD-10-CM | POA: Diagnosis not present

## 2024-03-31 DIAGNOSIS — M25551 Pain in right hip: Secondary | ICD-10-CM | POA: Diagnosis not present

## 2024-03-31 DIAGNOSIS — M545 Low back pain, unspecified: Secondary | ICD-10-CM | POA: Diagnosis not present

## 2024-04-04 ENCOUNTER — Inpatient Hospital Stay: Payer: BC Managed Care – PPO | Attending: Hematology and Oncology

## 2024-04-04 VITALS — BP 114/82 | HR 80 | Temp 98.9°F | Resp 18

## 2024-04-04 DIAGNOSIS — Z5111 Encounter for antineoplastic chemotherapy: Secondary | ICD-10-CM | POA: Insufficient documentation

## 2024-04-04 DIAGNOSIS — M9902 Segmental and somatic dysfunction of thoracic region: Secondary | ICD-10-CM | POA: Diagnosis not present

## 2024-04-04 DIAGNOSIS — M9901 Segmental and somatic dysfunction of cervical region: Secondary | ICD-10-CM | POA: Diagnosis not present

## 2024-04-04 DIAGNOSIS — C50412 Malignant neoplasm of upper-outer quadrant of left female breast: Secondary | ICD-10-CM | POA: Insufficient documentation

## 2024-04-04 DIAGNOSIS — M9903 Segmental and somatic dysfunction of lumbar region: Secondary | ICD-10-CM | POA: Diagnosis not present

## 2024-04-04 DIAGNOSIS — Z170 Estrogen receptor positive status [ER+]: Secondary | ICD-10-CM

## 2024-04-04 DIAGNOSIS — M9907 Segmental and somatic dysfunction of upper extremity: Secondary | ICD-10-CM | POA: Diagnosis not present

## 2024-04-04 MED ORDER — GOSERELIN ACETATE 3.6 MG ~~LOC~~ IMPL
3.6000 mg | DRUG_IMPLANT | SUBCUTANEOUS | Status: DC
  Administered 2024-04-04: 3.6 mg via SUBCUTANEOUS
  Filled 2024-04-04: qty 3.6

## 2024-04-05 ENCOUNTER — Encounter: Payer: Self-pay | Admitting: Family Medicine

## 2024-04-05 ENCOUNTER — Ambulatory Visit (INDEPENDENT_AMBULATORY_CARE_PROVIDER_SITE_OTHER): Admitting: Family Medicine

## 2024-04-05 VITALS — BP 102/65 | HR 73 | Temp 98.0°F | Ht 65.0 in | Wt 202.0 lb

## 2024-04-05 DIAGNOSIS — E6609 Other obesity due to excess calories: Secondary | ICD-10-CM

## 2024-04-05 DIAGNOSIS — G932 Benign intracranial hypertension: Secondary | ICD-10-CM | POA: Diagnosis not present

## 2024-04-05 DIAGNOSIS — Z6833 Body mass index (BMI) 33.0-33.9, adult: Secondary | ICD-10-CM

## 2024-04-05 DIAGNOSIS — I10 Essential (primary) hypertension: Secondary | ICD-10-CM | POA: Diagnosis not present

## 2024-04-05 DIAGNOSIS — E66811 Obesity, class 1: Secondary | ICD-10-CM | POA: Diagnosis not present

## 2024-04-05 MED ORDER — TIRZEPATIDE-WEIGHT MANAGEMENT 7.5 MG/0.5ML ~~LOC~~ SOAJ: 7.5000 mg | SUBCUTANEOUS | 0 refills | Status: DC

## 2024-04-05 MED ORDER — HYZAAR 50-12.5 MG PO TABS: ORAL_TABLET | ORAL | Status: DC

## 2024-04-05 NOTE — Patient Instructions (Signed)
 Reduce Hyzaar hydrochlorothiazide to 1/2 tab daily  Continue Zepbound 7.5 mg weekly  KEEP UP THE GOOD WORK!!

## 2024-04-05 NOTE — Progress Notes (Signed)
 Office: [phone]  /  Fax: [phone]  WEIGHT SUMMARY AND BIOMETRICS  Starting Date: 08/26/23  Starting Weight: 241lb   Weight Lost Since Last Visit: 5lb   Vitals Temp: 98 F (36.7 C) BP: 102/65 Pulse Rate: 73 SpO2: 97 %   Body Composition  Body Fat %: 42.5 % Fat Mass (lbs): 86.2 lbs Muscle Mass (lbs): 110.6 lbs Total Body Water (lbs): 76.8 lbs Visceral Fat Rating : 11   HPI  Chief Complaint: OBESITY  Caitlin [name] is here to discuss her progress with her obesity treatment plan. She is on the the Category 2 Plan and states she is following her eating plan approximately 75 % of the time. She states she is exercising 30-60 minutes 3 times per week.  Interval History:  Since last office visit she is down 5 lb Muscle mass is down 0.6 lb and body fat is down 4 lb in the past month This gives her a net weight loss of 39 lb in the past 7 mos of medically supervised weight management This is a 16.1% TBW loss She is doing weight training and yoga for workouts (with a trainer) 3 x a week She is doing well with food choices She is feeling her weight loss She is sleeping better She is working on water intake and fruits/ veggies She has been more flexible and better indurance  Pharmacotherapy: Zepbound 7.5 mg weekly (cash pay)  PHYSICAL EXAM:  Blood pressure 102/65, pulse 73, temperature 98 F (36.7 C), height 5\' 5"  (1.651 m), weight 202 lb (91.6 kg), SpO2 97%. Body mass index is 33.61 kg/m.  General: She is overweight, cooperative, alert, well developed, and in no acute distress. PSYCH: Has normal mood, affect and thought process.   Lungs: Normal breathing effort, no conversational dyspnea.  ASSESSMENT AND PLAN  TREATMENT PLAN FOR OBESITY:  Recommended Dietary Goals  Caitlin [name] is currently in the action stage of change. As such, her goal is to continue weight management plan. She has agreed to keeping a food journal and adhering to recommended goals of 1400  calories and 90 g of  protein and practicing portion control and making smarter food choices, such as increasing vegetables and decreasing simple carbohydrates.  Behavioral Intervention  We discussed the following Behavioral Modification Strategies today: increasing lean protein intake to established goals, increasing fiber rich foods, increasing water intake , work on meal planning and preparation, work on Counselling psychologist calories using tracking application, keeping healthy foods at home, work on managing stress, creating time for self-care and relaxation, avoiding temptations and identifying enticing environmental cues, and continue to work on maintaining a reduced calorie state, getting the recommended amount of protein, incorporating whole foods, making healthy choices, staying well hydrated and practicing mindfulness when eating..  Additional resources provided today: NA  Recommended Physical Activity Goals  Caitlin [name] has been advised to work up to 150 minutes of moderate intensity aerobic activity a week and strengthening exercises 2-3 times per week for cardiovascular health, weight loss maintenance and preservation of muscle mass.   She has agreed to Work on scheduling and tracking physical activity.   Pharmacotherapy changes for the treatment of obesity: none  ASSOCIATED CONDITIONS ADDRESSED TODAY  Essential hypertension Blood pressure has been running lower with weight loss.  She denies feeling of syncope or weakness.  She has been taking Hyzaar 50/12.5 mg daily per Dr. Hal Hope.  With weight loss and lower blood pressure readings on a consistent basis, we will reduce her Hyzaar 50/12.5-1/2 tab  once daily.  Follow-up with PCP.  Continue healthy lifestyle changes.  Class 1 obesity due to excess calories with body mass index (BMI) of 33.0 to 33.9 in adult, unspecified whether serious comorbidity present She has adequate satiety, denies meal skipping or GI side effects on  Zepbound 7.5 mg weekly.  Will continue. -     Tirzepatide-Weight Management; Inject 7.5 mg into the skin once a week.  Dispense: 2 mL; Refill: 0  Benign intracranial hypertension Improving.  She has noted a decrease in headache frequency with weight reduction.  She is managed by Dr. Everlena Cooper.  Continue to work on weight reduction.  Other orders -     Hyzaar; 1/2 tab po daily      She was informed of the importance of frequent follow up visits to maximize her success with intensive lifestyle modifications for her multiple health conditions.   ATTESTASTION STATEMENTS:  Reviewed by clinician on day of visit: allergies, medications, problem list, medical history, surgical history, family history, social history, and previous encounter notes pertinent to obesity diagnosis.   I have personally spent 30 minutes total time today in preparation, patient care, nutritional counseling and education,  and documentation for this visit, including the following: review of most recent clinical lab tests, prescribing medications/ refilling medications, reviewing medical assistant documentation, review and interpretation of bioimpedence results.     [name], D.O. DABFM, DABOM [facility] [address] [place], [place] [address] [phone]

## 2024-04-06 DIAGNOSIS — M9902 Segmental and somatic dysfunction of thoracic region: Secondary | ICD-10-CM | POA: Diagnosis not present

## 2024-04-06 DIAGNOSIS — M9903 Segmental and somatic dysfunction of lumbar region: Secondary | ICD-10-CM | POA: Diagnosis not present

## 2024-04-06 DIAGNOSIS — M9907 Segmental and somatic dysfunction of upper extremity: Secondary | ICD-10-CM | POA: Diagnosis not present

## 2024-04-06 DIAGNOSIS — M9901 Segmental and somatic dysfunction of cervical region: Secondary | ICD-10-CM | POA: Diagnosis not present

## 2024-04-07 ENCOUNTER — Encounter: Admitting: Rehabilitation

## 2024-04-07 ENCOUNTER — Encounter: Payer: Self-pay | Admitting: Licensed Clinical Social Worker

## 2024-04-07 DIAGNOSIS — M9903 Segmental and somatic dysfunction of lumbar region: Secondary | ICD-10-CM | POA: Diagnosis not present

## 2024-04-07 DIAGNOSIS — M9907 Segmental and somatic dysfunction of upper extremity: Secondary | ICD-10-CM | POA: Diagnosis not present

## 2024-04-07 DIAGNOSIS — M9901 Segmental and somatic dysfunction of cervical region: Secondary | ICD-10-CM | POA: Diagnosis not present

## 2024-04-07 DIAGNOSIS — M9902 Segmental and somatic dysfunction of thoracic region: Secondary | ICD-10-CM | POA: Diagnosis not present

## 2024-04-07 NOTE — Progress Notes (Signed)
 [facility] Clinical Social Work   CSW received message from pt regarding an issue with a savings card for Zoladex and payments not being received by our system.  CSW requested assistance from Kevin Fenton with revenue cycle and also forwarded question to RN & pharmacy team to determine if they have experience with the Zoladex savings card.   Caitlin Matura E Anselmo Reihl, LCSW

## 2024-04-11 DIAGNOSIS — M9907 Segmental and somatic dysfunction of upper extremity: Secondary | ICD-10-CM | POA: Diagnosis not present

## 2024-04-11 DIAGNOSIS — M9901 Segmental and somatic dysfunction of cervical region: Secondary | ICD-10-CM | POA: Diagnosis not present

## 2024-04-11 DIAGNOSIS — M9902 Segmental and somatic dysfunction of thoracic region: Secondary | ICD-10-CM | POA: Diagnosis not present

## 2024-04-11 DIAGNOSIS — M9903 Segmental and somatic dysfunction of lumbar region: Secondary | ICD-10-CM | POA: Diagnosis not present

## 2024-04-13 DIAGNOSIS — M9902 Segmental and somatic dysfunction of thoracic region: Secondary | ICD-10-CM | POA: Diagnosis not present

## 2024-04-13 DIAGNOSIS — M9907 Segmental and somatic dysfunction of upper extremity: Secondary | ICD-10-CM | POA: Diagnosis not present

## 2024-04-13 DIAGNOSIS — M9903 Segmental and somatic dysfunction of lumbar region: Secondary | ICD-10-CM | POA: Diagnosis not present

## 2024-04-13 DIAGNOSIS — M9901 Segmental and somatic dysfunction of cervical region: Secondary | ICD-10-CM | POA: Diagnosis not present

## 2024-04-18 DIAGNOSIS — M9903 Segmental and somatic dysfunction of lumbar region: Secondary | ICD-10-CM | POA: Diagnosis not present

## 2024-04-18 DIAGNOSIS — M9907 Segmental and somatic dysfunction of upper extremity: Secondary | ICD-10-CM | POA: Diagnosis not present

## 2024-04-18 DIAGNOSIS — M9902 Segmental and somatic dysfunction of thoracic region: Secondary | ICD-10-CM | POA: Diagnosis not present

## 2024-04-18 DIAGNOSIS — M9901 Segmental and somatic dysfunction of cervical region: Secondary | ICD-10-CM | POA: Diagnosis not present

## 2024-04-20 DIAGNOSIS — F4321 Adjustment disorder with depressed mood: Secondary | ICD-10-CM | POA: Diagnosis not present

## 2024-04-20 DIAGNOSIS — M9903 Segmental and somatic dysfunction of lumbar region: Secondary | ICD-10-CM | POA: Diagnosis not present

## 2024-04-20 DIAGNOSIS — M9901 Segmental and somatic dysfunction of cervical region: Secondary | ICD-10-CM | POA: Diagnosis not present

## 2024-04-20 DIAGNOSIS — M9907 Segmental and somatic dysfunction of upper extremity: Secondary | ICD-10-CM | POA: Diagnosis not present

## 2024-04-20 DIAGNOSIS — M9902 Segmental and somatic dysfunction of thoracic region: Secondary | ICD-10-CM | POA: Diagnosis not present

## 2024-04-21 DIAGNOSIS — M9907 Segmental and somatic dysfunction of upper extremity: Secondary | ICD-10-CM | POA: Diagnosis not present

## 2024-04-21 DIAGNOSIS — M9903 Segmental and somatic dysfunction of lumbar region: Secondary | ICD-10-CM | POA: Diagnosis not present

## 2024-04-21 DIAGNOSIS — M9901 Segmental and somatic dysfunction of cervical region: Secondary | ICD-10-CM | POA: Diagnosis not present

## 2024-04-21 DIAGNOSIS — M9902 Segmental and somatic dysfunction of thoracic region: Secondary | ICD-10-CM | POA: Diagnosis not present

## 2024-04-22 ENCOUNTER — Other Ambulatory Visit: Payer: Self-pay | Admitting: Family Medicine

## 2024-04-22 DIAGNOSIS — E6609 Other obesity due to excess calories: Secondary | ICD-10-CM

## 2024-04-22 DIAGNOSIS — E66811 Obesity, class 1: Secondary | ICD-10-CM

## 2024-04-25 ENCOUNTER — Ambulatory Visit: Payer: BC Managed Care – PPO | Attending: Radiation Oncology

## 2024-04-25 VITALS — Wt 206.4 lb

## 2024-04-25 DIAGNOSIS — M9907 Segmental and somatic dysfunction of upper extremity: Secondary | ICD-10-CM | POA: Diagnosis not present

## 2024-04-25 DIAGNOSIS — M9903 Segmental and somatic dysfunction of lumbar region: Secondary | ICD-10-CM | POA: Diagnosis not present

## 2024-04-25 DIAGNOSIS — Z483 Aftercare following surgery for neoplasm: Secondary | ICD-10-CM | POA: Insufficient documentation

## 2024-04-25 DIAGNOSIS — M9901 Segmental and somatic dysfunction of cervical region: Secondary | ICD-10-CM | POA: Diagnosis not present

## 2024-04-25 DIAGNOSIS — M9902 Segmental and somatic dysfunction of thoracic region: Secondary | ICD-10-CM | POA: Diagnosis not present

## 2024-04-25 NOTE — Therapy (Signed)
 OUTPATIENT PHYSICAL THERAPY SOZO SCREENING NOTE   Patient Name: Caitlin [name] MRN: [id number] DOB:07/12/1971, 53 y.o., female Today's Date: 04/25/2024  PCP: Allene Ivan, MD REFERRING PROVIDER: Colie Dawes, MD   PT End of Session - 04/25/24 4098     Visit Number 3   # unchanged due to screen only   PT Start Time 0830    PT Stop Time 0834    PT Time Calculation (min) 4 min    Activity Tolerance Patient tolerated treatment well    Behavior During Therapy WFL for tasks assessed/performed             Past Medical History:  Diagnosis Date   Anxiety    Back pain    Breast cancer (HCC)    Depression    Edema of both lower extremities    Elevated cholesterol    Endometriosis    Endometriosis    Family history of kidney cancer    Family history of ovarian cancer    Fatty liver    Gallbladder problem    High blood pressure    High cholesterol    History of IBS    IBS (irritable bowel syndrome)    Infertility, female    Joint pain    Personal history of radiation therapy    Vitamin D  deficiency    Past Surgical History:  Procedure Laterality Date   BREAST LUMPECTOMY     BREAST LUMPECTOMY WITH RADIOACTIVE SEED AND SENTINEL LYMPH NODE BIOPSY Left 04/22/2022   Procedure: LEFT BREAST LUMPECTOMY WITH RADIOACTIVE SEED;  Surgeon: Oza Blumenthal, MD;  Location: [facility] OR;  Service: General;  Laterality: Left;  LMA   CHOLECYSTECTOMY  2018   LAPAROSCOPY     SENTINEL NODE BIOPSY Left 04/22/2022   Procedure: LEFT SENTINEL LYMPH NODE BIOPSY;  Surgeon: Oza Blumenthal, MD;  Location: [facility] OR;  Service: General;  Laterality: Left;   Patient Active Problem List   Diagnosis Date Noted   Sedentary lifestyle 11/19/2023   Heartburn 10/26/2023   Insulin  resistance 09/09/2023   Polyphagia 09/09/2023   Other fatigue 08/26/2023   SOBOE (shortness of breath on exertion) 08/26/2023   History of breast cancer 08/26/2023   Essential hypertension 08/26/2023   Plantar fasciitis  08/26/2023   Depression 08/26/2023   Depression screen 08/26/2023   Pre-diabetes 08/26/2023   Vitamin D  deficiency 08/26/2023   Other hyperlipidemia 08/26/2023   BMI 40.0-44.9, adult (HCC) 08/26/2023   Morbid obesity (HCC) with starting BMI 40 08/26/2023   Elevated blood-pressure reading without diagnosis of hypertension 03/26/2023   Family history of colonic polyps 10/22/2022   Genetic testing 04/29/2022   Family history of ovarian cancer 04/15/2022   Family history of kidney cancer 04/15/2022   Malignant neoplasm of upper-outer quadrant of left breast in female, estrogen receptor positive (HCC) 04/02/2022   Endometriosis 02/20/2022   Vitamin D  insufficiency 02/18/2021   Elevated cholesterol 02/18/2021   Class 1 obesity due to excess calories with body mass index (BMI) of 34.0 to 34.9 in adult 02/07/2021   Irritable bowel syndrome 02/07/2021   Benign intracranial hypertension 09/10/2020    REFERRING DIAG: left breast cancer at risk for lymphedema  THERAPY DIAG: Aftercare following surgery for neoplasm  PERTINENT HISTORY: Patient was diagnosed with left IDC. ER/PR positive, HER2 negative KI67 less than 5%. Pt had left lumpectomy and SLNB on 04/22/22 with Dr. Lucienne Ryder. Radiation. No chemotherapy.  Radiation completed 07/09/22. Starting tamoxifen .   PRECAUTIONS: left UE Lymphedema risk, None  SUBJECTIVE: Pt returns  for her last 3 month L-Dex screen.   PAIN:  Are you having pain? No  SOZO SCREENING: Patient was assessed today using the SOZO machine to determine the lymphedema index score. This was compared to her baseline score. It was determined that she is within the recommended range when compared to her baseline and no further action is needed at this time. She will continue SOZO screenings. These are done every 3 months for 2 years post operatively followed by every 6 months for 2 years, and then annually.   L-DEX FLOWSHEETS - 04/25/24 0800       L-DEX LYMPHEDEMA SCREENING    Measurement Type Unilateral    L-DEX MEASUREMENT EXTREMITY Upper Extremity    POSITION  Standing    DOMINANT SIDE Right    At Risk Side Left    BASELINE SCORE (UNILATERAL) -2.6    L-DEX SCORE (UNILATERAL) -1.6    VALUE CHANGE (UNILAT) 1               [name], PTA 04/25/2024, 8:34 AM

## 2024-04-27 DIAGNOSIS — M9901 Segmental and somatic dysfunction of cervical region: Secondary | ICD-10-CM | POA: Diagnosis not present

## 2024-04-27 DIAGNOSIS — M9903 Segmental and somatic dysfunction of lumbar region: Secondary | ICD-10-CM | POA: Diagnosis not present

## 2024-04-27 DIAGNOSIS — M9902 Segmental and somatic dysfunction of thoracic region: Secondary | ICD-10-CM | POA: Diagnosis not present

## 2024-04-27 DIAGNOSIS — M9907 Segmental and somatic dysfunction of upper extremity: Secondary | ICD-10-CM | POA: Diagnosis not present

## 2024-04-28 DIAGNOSIS — M9907 Segmental and somatic dysfunction of upper extremity: Secondary | ICD-10-CM | POA: Diagnosis not present

## 2024-04-28 DIAGNOSIS — M9902 Segmental and somatic dysfunction of thoracic region: Secondary | ICD-10-CM | POA: Diagnosis not present

## 2024-04-28 DIAGNOSIS — M9901 Segmental and somatic dysfunction of cervical region: Secondary | ICD-10-CM | POA: Diagnosis not present

## 2024-04-28 DIAGNOSIS — M9903 Segmental and somatic dysfunction of lumbar region: Secondary | ICD-10-CM | POA: Diagnosis not present

## 2024-05-02 ENCOUNTER — Inpatient Hospital Stay: Payer: BC Managed Care – PPO | Attending: Hematology and Oncology

## 2024-05-02 VITALS — BP 126/97 | HR 71 | Temp 98.0°F | Resp 18

## 2024-05-02 DIAGNOSIS — M9901 Segmental and somatic dysfunction of cervical region: Secondary | ICD-10-CM | POA: Diagnosis not present

## 2024-05-02 DIAGNOSIS — M9902 Segmental and somatic dysfunction of thoracic region: Secondary | ICD-10-CM | POA: Diagnosis not present

## 2024-05-02 DIAGNOSIS — M9907 Segmental and somatic dysfunction of upper extremity: Secondary | ICD-10-CM | POA: Diagnosis not present

## 2024-05-02 DIAGNOSIS — Z5111 Encounter for antineoplastic chemotherapy: Secondary | ICD-10-CM | POA: Insufficient documentation

## 2024-05-02 DIAGNOSIS — M9903 Segmental and somatic dysfunction of lumbar region: Secondary | ICD-10-CM | POA: Diagnosis not present

## 2024-05-02 DIAGNOSIS — C50412 Malignant neoplasm of upper-outer quadrant of left female breast: Secondary | ICD-10-CM | POA: Insufficient documentation

## 2024-05-02 MED ORDER — GOSERELIN ACETATE 3.6 MG ~~LOC~~ IMPL
3.6000 mg | DRUG_IMPLANT | SUBCUTANEOUS | Status: DC
  Administered 2024-05-02: 3.6 mg via SUBCUTANEOUS
  Filled 2024-05-02: qty 3.6

## 2024-05-03 DIAGNOSIS — C50912 Malignant neoplasm of unspecified site of left female breast: Secondary | ICD-10-CM | POA: Diagnosis not present

## 2024-05-03 DIAGNOSIS — M65331 Trigger finger, right middle finger: Secondary | ICD-10-CM | POA: Diagnosis not present

## 2024-05-03 DIAGNOSIS — E559 Vitamin D deficiency, unspecified: Secondary | ICD-10-CM | POA: Diagnosis not present

## 2024-05-03 DIAGNOSIS — F339 Major depressive disorder, recurrent, unspecified: Secondary | ICD-10-CM | POA: Diagnosis not present

## 2024-05-04 DIAGNOSIS — M9901 Segmental and somatic dysfunction of cervical region: Secondary | ICD-10-CM | POA: Diagnosis not present

## 2024-05-04 DIAGNOSIS — M9907 Segmental and somatic dysfunction of upper extremity: Secondary | ICD-10-CM | POA: Diagnosis not present

## 2024-05-04 DIAGNOSIS — M9903 Segmental and somatic dysfunction of lumbar region: Secondary | ICD-10-CM | POA: Diagnosis not present

## 2024-05-04 DIAGNOSIS — M9902 Segmental and somatic dysfunction of thoracic region: Secondary | ICD-10-CM | POA: Diagnosis not present

## 2024-05-05 ENCOUNTER — Encounter: Payer: Self-pay | Admitting: Family Medicine

## 2024-05-05 ENCOUNTER — Ambulatory Visit (INDEPENDENT_AMBULATORY_CARE_PROVIDER_SITE_OTHER): Admitting: Family Medicine

## 2024-05-05 VITALS — BP 115/77 | HR 66 | Temp 97.7°F | Ht 65.0 in | Wt 199.0 lb

## 2024-05-05 DIAGNOSIS — I10 Essential (primary) hypertension: Secondary | ICD-10-CM | POA: Diagnosis not present

## 2024-05-05 DIAGNOSIS — E66811 Obesity, class 1: Secondary | ICD-10-CM

## 2024-05-05 DIAGNOSIS — Z853 Personal history of malignant neoplasm of breast: Secondary | ICD-10-CM

## 2024-05-05 DIAGNOSIS — R7303 Prediabetes: Secondary | ICD-10-CM

## 2024-05-05 DIAGNOSIS — Z6833 Body mass index (BMI) 33.0-33.9, adult: Secondary | ICD-10-CM | POA: Diagnosis not present

## 2024-05-05 DIAGNOSIS — E6609 Other obesity due to excess calories: Secondary | ICD-10-CM

## 2024-05-05 MED ORDER — TIRZEPATIDE-WEIGHT MANAGEMENT 10 MG/0.5ML ~~LOC~~ SOLN: 10.0000 mg | SUBCUTANEOUS | 0 refills | Status: DC

## 2024-05-05 NOTE — Patient Instructions (Addendum)
 Try to log calorie intake for a week Aim for 1500 cal/ day This should include 90-110 g of protein daily  [place] job with weight training! Aim for a good 30+ min of walking 3-4 days/ wk  Hydrate well water and sugar free electrolytes  Increase Zebpound to 10 mg weekly

## 2024-05-05 NOTE — Progress Notes (Signed)
 Office: [phone]  /  Fax: [phone]  WEIGHT SUMMARY AND BIOMETRICS  Starting Date: 08/26/23  Starting Weight: 241lb   Weight Lost Since Last Visit: 3lb   Vitals Temp: 97.7 F (36.5 C) BP: 115/77 Pulse Rate: 66 SpO2: 98 %   Body Composition  Body Fat %: 41.7 % Fat Mass (lbs): 83.2 lbs Muscle Mass (lbs): 110.6 lbs Total Body Water (lbs): 78 lbs Visceral Fat Rating : 10   HPI  Chief Complaint: OBESITY  Caitlin [name] is here to discuss her progress with her obesity treatment plan. She is on the the Category 2 Plan and states she is following her eating plan approximately 60 % of the time. She states she is exercising 30-60 minutes 3 times per week.  Interval History:  Since last office visit she is down 3 lb She maintained her muscle mass and lost 3 lb of body fat Doing well on Zepbound  7.5 mg weekly Has felt a bit snackier x 2 weeks Denies meal skipping or GI upset Working out with a trainer 3 x a week and is doing yoga Net weight loss is now 42 lb in 8 mos of medically supervised weight management This is a 17% TBW loss  Pharmacotherapy: Zepbound  7.5 mg weekly  PHYSICAL EXAM:  Blood pressure 115/77, pulse 66, temperature 97.7 F (36.5 C), height 5\' 5"  (1.651 m), weight 199 lb (90.3 kg), SpO2 98%. Body mass index is 33.12 kg/m.  General: She is overweight, cooperative, alert, well developed, and in no acute distress. PSYCH: Has normal mood, affect and thought process.   Lungs: Normal breathing effort, no conversational dyspnea.   ASSESSMENT AND PLAN  TREATMENT PLAN FOR OBESITY:  Recommended Dietary Goals  Emaria is currently in the action stage of change. As such, her goal is to continue weight management plan. She has agreed to keeping a food journal and adhering to recommended goals of 1500 calories and 100 g of  protein and practicing portion control and making smarter food choices, such as increasing vegetables and decreasing simple  carbohydrates.  Behavioral Intervention  We discussed the following Behavioral Modification Strategies today: increasing lean protein intake to established goals, increasing fiber rich foods, increasing water intake , work on meal planning and preparation, work on Counselling psychologist calories using tracking application, keeping healthy foods at home, avoiding temptations and identifying enticing environmental cues, planning for success, and continue to work on maintaining a reduced calorie state, getting the recommended amount of protein, incorporating whole foods, making healthy choices, staying well hydrated and practicing mindfulness when eating..  Additional resources provided today: NA  Recommended Physical Activity Goals  Jaelan has been advised to work up to 150 minutes of moderate intensity aerobic activity a week and strengthening exercises 2-3 times per week for cardiovascular health, weight loss maintenance and preservation of muscle mass.   She has agreed to Work on scheduling and tracking physical activity.   Pharmacotherapy changes for the treatment of obesity: increase Zepbound  to 10 mg weekly  ASSOCIATED CONDITIONS ADDRESSED TODAY  Essential hypertension BP has improved and her PCP has discontinued her Hyzaar .  BP looks great today and she is happy to see big improvements with healthy lifestyle change and weight loss.  Continue to focus on healthy foods, regular exercise and maintaining a healthy weight  Class 1 obesity due to excess calories with serious comorbidity and body mass index (BMI) of 33.0 to 33.9 in adult -     Tirzepatide -Weight Management; Inject 10 mg into the  skin once a week.  Dispense: 2 mL; Refill: 0 Due to waning satiety on Zepbound  7.5 mg dose, will increase to 10 mg (cash pay) Reviewed goals on AVS  Pre-diabetes Lab Results  Component Value Date   HGBA1C 5.6 02/10/2024   Improved with weight loss, healthy lifestyle changes Aim for workouts  for a total of 5 days/ wk Avoid high sugar foods and drinks Consider changing to metformin once she comes off Zepbound   History of breast cancer Stable On Zoladex  3.6 mg q 28 days with Dr Arno Bibles and Femara  2.5 mg daily for 2+ more years with hx of L breast cancer.  Continue to work on body fat reduction with a goal % body fat close to 30 to reduce risk of future weight related cancers    She was informed of the importance of frequent follow up visits to maximize her success with intensive lifestyle modifications for her multiple health conditions.   ATTESTASTION STATEMENTS:  Reviewed by clinician on day of visit: allergies, medications, problem list, medical history, surgical history, family history, social history, and previous encounter notes pertinent to obesity diagnosis.   I have personally spent 30 minutes total time today in preparation, patient care, nutritional counseling and education,  and documentation for this visit, including the following: review of most recent clinical lab tests, prescribing medications/ refilling medications, reviewing medical assistant documentation, review and interpretation of bioimpedence results.     Micky Albee, D.O. DABFM, DABOM [facility] [address] [place], [place] [address] [phone]

## 2024-05-11 DIAGNOSIS — M9902 Segmental and somatic dysfunction of thoracic region: Secondary | ICD-10-CM | POA: Diagnosis not present

## 2024-05-11 DIAGNOSIS — M9907 Segmental and somatic dysfunction of upper extremity: Secondary | ICD-10-CM | POA: Diagnosis not present

## 2024-05-11 DIAGNOSIS — M9903 Segmental and somatic dysfunction of lumbar region: Secondary | ICD-10-CM | POA: Diagnosis not present

## 2024-05-11 DIAGNOSIS — M9901 Segmental and somatic dysfunction of cervical region: Secondary | ICD-10-CM | POA: Diagnosis not present

## 2024-05-16 DIAGNOSIS — M9907 Segmental and somatic dysfunction of upper extremity: Secondary | ICD-10-CM | POA: Diagnosis not present

## 2024-05-16 DIAGNOSIS — M9901 Segmental and somatic dysfunction of cervical region: Secondary | ICD-10-CM | POA: Diagnosis not present

## 2024-05-16 DIAGNOSIS — M9903 Segmental and somatic dysfunction of lumbar region: Secondary | ICD-10-CM | POA: Diagnosis not present

## 2024-05-16 DIAGNOSIS — M9902 Segmental and somatic dysfunction of thoracic region: Secondary | ICD-10-CM | POA: Diagnosis not present

## 2024-05-17 DIAGNOSIS — F4321 Adjustment disorder with depressed mood: Secondary | ICD-10-CM | POA: Diagnosis not present

## 2024-05-19 DIAGNOSIS — M9903 Segmental and somatic dysfunction of lumbar region: Secondary | ICD-10-CM | POA: Diagnosis not present

## 2024-05-19 DIAGNOSIS — M9901 Segmental and somatic dysfunction of cervical region: Secondary | ICD-10-CM | POA: Diagnosis not present

## 2024-05-19 DIAGNOSIS — M9907 Segmental and somatic dysfunction of upper extremity: Secondary | ICD-10-CM | POA: Diagnosis not present

## 2024-05-19 DIAGNOSIS — M9902 Segmental and somatic dysfunction of thoracic region: Secondary | ICD-10-CM | POA: Diagnosis not present

## 2024-05-24 ENCOUNTER — Other Ambulatory Visit: Payer: Self-pay | Admitting: Family Medicine

## 2024-05-24 DIAGNOSIS — Z6833 Body mass index (BMI) 33.0-33.9, adult: Secondary | ICD-10-CM

## 2024-05-25 DIAGNOSIS — M9902 Segmental and somatic dysfunction of thoracic region: Secondary | ICD-10-CM | POA: Diagnosis not present

## 2024-05-25 DIAGNOSIS — M9901 Segmental and somatic dysfunction of cervical region: Secondary | ICD-10-CM | POA: Diagnosis not present

## 2024-05-25 DIAGNOSIS — M9903 Segmental and somatic dysfunction of lumbar region: Secondary | ICD-10-CM | POA: Diagnosis not present

## 2024-05-25 DIAGNOSIS — M9907 Segmental and somatic dysfunction of upper extremity: Secondary | ICD-10-CM | POA: Diagnosis not present

## 2024-05-26 DIAGNOSIS — M9902 Segmental and somatic dysfunction of thoracic region: Secondary | ICD-10-CM | POA: Diagnosis not present

## 2024-05-26 DIAGNOSIS — M9901 Segmental and somatic dysfunction of cervical region: Secondary | ICD-10-CM | POA: Diagnosis not present

## 2024-05-26 DIAGNOSIS — M9907 Segmental and somatic dysfunction of upper extremity: Secondary | ICD-10-CM | POA: Diagnosis not present

## 2024-05-26 DIAGNOSIS — M9903 Segmental and somatic dysfunction of lumbar region: Secondary | ICD-10-CM | POA: Diagnosis not present

## 2024-05-30 ENCOUNTER — Inpatient Hospital Stay: Payer: BC Managed Care – PPO | Attending: Hematology and Oncology

## 2024-05-30 VITALS — BP 121/81 | HR 88 | Temp 98.2°F | Resp 17

## 2024-05-30 DIAGNOSIS — Z1732 Human epidermal growth factor receptor 2 negative status: Secondary | ICD-10-CM | POA: Insufficient documentation

## 2024-05-30 DIAGNOSIS — Z1721 Progesterone receptor positive status: Secondary | ICD-10-CM | POA: Diagnosis not present

## 2024-05-30 DIAGNOSIS — C50412 Malignant neoplasm of upper-outer quadrant of left female breast: Secondary | ICD-10-CM | POA: Diagnosis not present

## 2024-05-30 DIAGNOSIS — Z5111 Encounter for antineoplastic chemotherapy: Secondary | ICD-10-CM | POA: Diagnosis not present

## 2024-05-30 DIAGNOSIS — Z170 Estrogen receptor positive status [ER+]: Secondary | ICD-10-CM | POA: Insufficient documentation

## 2024-05-30 MED ORDER — GOSERELIN ACETATE 3.6 MG ~~LOC~~ IMPL
3.6000 mg | DRUG_IMPLANT | SUBCUTANEOUS | Status: DC
  Administered 2024-05-30: 3.6 mg via SUBCUTANEOUS
  Filled 2024-05-30: qty 3.6

## 2024-06-01 DIAGNOSIS — M9901 Segmental and somatic dysfunction of cervical region: Secondary | ICD-10-CM | POA: Diagnosis not present

## 2024-06-01 DIAGNOSIS — M9903 Segmental and somatic dysfunction of lumbar region: Secondary | ICD-10-CM | POA: Diagnosis not present

## 2024-06-01 DIAGNOSIS — M9907 Segmental and somatic dysfunction of upper extremity: Secondary | ICD-10-CM | POA: Diagnosis not present

## 2024-06-01 DIAGNOSIS — M9902 Segmental and somatic dysfunction of thoracic region: Secondary | ICD-10-CM | POA: Diagnosis not present

## 2024-06-02 DIAGNOSIS — M9907 Segmental and somatic dysfunction of upper extremity: Secondary | ICD-10-CM | POA: Diagnosis not present

## 2024-06-02 DIAGNOSIS — M9902 Segmental and somatic dysfunction of thoracic region: Secondary | ICD-10-CM | POA: Diagnosis not present

## 2024-06-02 DIAGNOSIS — M9901 Segmental and somatic dysfunction of cervical region: Secondary | ICD-10-CM | POA: Diagnosis not present

## 2024-06-02 DIAGNOSIS — M9903 Segmental and somatic dysfunction of lumbar region: Secondary | ICD-10-CM | POA: Diagnosis not present

## 2024-06-03 DIAGNOSIS — F411 Generalized anxiety disorder: Secondary | ICD-10-CM | POA: Diagnosis not present

## 2024-06-03 DIAGNOSIS — I10 Essential (primary) hypertension: Secondary | ICD-10-CM | POA: Diagnosis not present

## 2024-06-03 DIAGNOSIS — C50912 Malignant neoplasm of unspecified site of left female breast: Secondary | ICD-10-CM | POA: Diagnosis not present

## 2024-06-03 DIAGNOSIS — R5383 Other fatigue: Secondary | ICD-10-CM | POA: Diagnosis not present

## 2024-06-03 DIAGNOSIS — E559 Vitamin D deficiency, unspecified: Secondary | ICD-10-CM | POA: Diagnosis not present

## 2024-06-03 DIAGNOSIS — R7301 Impaired fasting glucose: Secondary | ICD-10-CM | POA: Diagnosis not present

## 2024-06-03 DIAGNOSIS — R002 Palpitations: Secondary | ICD-10-CM | POA: Diagnosis not present

## 2024-06-03 DIAGNOSIS — E7800 Pure hypercholesterolemia, unspecified: Secondary | ICD-10-CM | POA: Diagnosis not present

## 2024-06-03 DIAGNOSIS — Z0001 Encounter for general adult medical examination with abnormal findings: Secondary | ICD-10-CM | POA: Diagnosis not present

## 2024-06-06 ENCOUNTER — Ambulatory Visit (INDEPENDENT_AMBULATORY_CARE_PROVIDER_SITE_OTHER): Admitting: Family Medicine

## 2024-06-06 ENCOUNTER — Encounter: Payer: Self-pay | Admitting: Family Medicine

## 2024-06-06 VITALS — BP 112/79 | HR 74 | Temp 98.0°F | Ht 65.0 in | Wt 195.0 lb

## 2024-06-06 DIAGNOSIS — E66811 Obesity, class 1: Secondary | ICD-10-CM | POA: Diagnosis not present

## 2024-06-06 DIAGNOSIS — E88819 Insulin resistance, unspecified: Secondary | ICD-10-CM | POA: Diagnosis not present

## 2024-06-06 DIAGNOSIS — G932 Benign intracranial hypertension: Secondary | ICD-10-CM | POA: Diagnosis not present

## 2024-06-06 DIAGNOSIS — Z853 Personal history of malignant neoplasm of breast: Secondary | ICD-10-CM

## 2024-06-06 DIAGNOSIS — Z6832 Body mass index (BMI) 32.0-32.9, adult: Secondary | ICD-10-CM

## 2024-06-06 MED ORDER — TIRZEPATIDE-WEIGHT MANAGEMENT 10 MG/0.5ML ~~LOC~~ SOLN: 10.0000 mg | SUBCUTANEOUS | 1 refills | Status: DC

## 2024-06-06 NOTE — Progress Notes (Signed)
 Office: [phone]  /  Fax: [phone]  WEIGHT SUMMARY AND BIOMETRICS  Starting Date: 08/26/23  Starting Weight: 241lb   Weight Lost Since Last Visit: 4lb   Vitals Temp: 98 F (36.7 C) BP: 112/79 Pulse Rate: 74 SpO2: 98 %   Body Composition  Body Fat %: 40 % Fat Mass (lbs): 79.4 lbs Muscle Mass (lbs): 109.8 lbs Total Body Water (lbs): 75.2 lbs Visceral Fat Rating : 10    HPI  Chief Complaint: OBESITY  Caitlin [name] is here to discuss her progress with her obesity treatment plan. She is on the the Category 1 Plan and states she is following her eating plan approximately 75 % of the time. She states she is exercising 30-60 minutes 3 times per week.  Interval History:  Since last office visit she is down 4 lb She is down 0.8 lb of muscle mass and down 3.8 lb of body fat since last visit She is strength training with a group 3 days/ wk and walking 3 days/wk This gives her a net weight loss 46 lb in the past 9 mos of medically supervised weight management This is an 18.6% TBW loss She is eating more salad + protein for lunches She has a good support system at home She has improved food noise on Zepbound  10 mg weekly Denies meal skipping or GI upset on Zepbound   Pharmacotherapy: Zepbound  10 mg weekly (cash pay)  PHYSICAL EXAM:  Blood pressure 112/79, pulse 74, temperature 98 F (36.7 C), height 5\' 5"  (1.651 m), weight 195 lb (88.5 kg), SpO2 98%. Body mass index is 32.45 kg/m.  General: She is overweight, cooperative, alert, well developed, and in no acute distress. PSYCH: Has normal mood, affect and thought process.   Lungs: Normal breathing effort, no conversational dyspnea.   ASSESSMENT AND PLAN  TREATMENT PLAN FOR OBESITY:  Recommended Dietary Goals  Caitlin [name] is currently in the action stage of change. As such, her goal is to continue weight management plan. She has agreed to keeping a food journal and adhering to recommended goals of 1500 calories and  95 g of  protein and practicing portion control and making smarter food choices, such as increasing vegetables and decreasing simple carbohydrates.  Behavioral Intervention  We discussed the following Behavioral Modification Strategies today: increasing lean protein intake to established goals, increasing fiber rich foods, increasing water intake , work on meal planning and preparation, keeping healthy foods at home, decreasing eating out or consumption of processed foods, and making healthy choices when eating convenient foods, practice mindfulness eating and understand the difference between hunger signals and cravings, work on managing stress, creating time for self-care and relaxation, continue to practice mindfulness when eating, and continue to work on maintaining a reduced calorie state, getting the recommended amount of protein, incorporating whole foods, making healthy choices, staying well hydrated and practicing mindfulness when eating..  Additional resources provided today: NA  Recommended Physical Activity Goals  [name] has been advised to work up to 150 minutes of moderate intensity aerobic activity a week and strengthening exercises 2-3 times per week for cardiovascular health, weight loss maintenance and preservation of muscle mass.   She has agreed to Work on scheduling and tracking physical activity.   Pharmacotherapy changes for the treatment of obesity: none  ASSOCIATED CONDITIONS ADDRESSED TODAY  Benign intracranial hypertension Improving, managed by Dr Festus Hubert, she has seen a reduction in headache frequency with an 18.6% TBW loss in 9 mos of medically supervised weight management  Continue active  plan for weight reduction with a goal body fat <35%  Class 1 obesity due to excess calories with body mass index (BMI) of 32.0 to 32.9 in adult, unspecified whether serious comorbidity present -     Tirzepatide -Weight Management; Inject 10 mg into the skin once a week.   Dispense: 2 mL; Refill: 1  History of breast cancer Continue Zoladex  3.6 mg q 28 day and Femara  2.5 mg daily per Dr Arno Bibles post L breast cancer Continue active plan for body fat reduction with a goal < 35%  Insulin  resistance Last fasting insulin  high at 20.6 2/12, doing well on Zepbound  for medical weight management along with a reduced kcal low sugar/ higher protein diet + cardio and resistance training 3+ days/ wk  Plan to repeat fasting insulin  in the next 2 mos     She was informed of the importance of frequent follow up visits to maximize her success with intensive lifestyle modifications for her multiple health conditions.   ATTESTASTION STATEMENTS:  Reviewed by clinician on day of visit: allergies, medications, problem list, medical history, surgical history, family history, social history, and previous encounter notes pertinent to obesity diagnosis.   I have personally spent 30 minutes total time today in preparation, patient care, nutritional counseling and education,  and documentation for this visit, including the following: review of most recent clinical lab tests, prescribing medications/ refilling medications, reviewing medical assistant documentation, review and interpretation of bioimpedence results.     Micky Albee, D.O. DABFM, DABOM [facility] [address] [place], [place] [address] [phone]

## 2024-06-08 DIAGNOSIS — M9901 Segmental and somatic dysfunction of cervical region: Secondary | ICD-10-CM | POA: Diagnosis not present

## 2024-06-08 DIAGNOSIS — M9903 Segmental and somatic dysfunction of lumbar region: Secondary | ICD-10-CM | POA: Diagnosis not present

## 2024-06-08 DIAGNOSIS — M9907 Segmental and somatic dysfunction of upper extremity: Secondary | ICD-10-CM | POA: Diagnosis not present

## 2024-06-08 DIAGNOSIS — M9902 Segmental and somatic dysfunction of thoracic region: Secondary | ICD-10-CM | POA: Diagnosis not present

## 2024-06-09 DIAGNOSIS — M9902 Segmental and somatic dysfunction of thoracic region: Secondary | ICD-10-CM | POA: Diagnosis not present

## 2024-06-09 DIAGNOSIS — M9901 Segmental and somatic dysfunction of cervical region: Secondary | ICD-10-CM | POA: Diagnosis not present

## 2024-06-09 DIAGNOSIS — M9903 Segmental and somatic dysfunction of lumbar region: Secondary | ICD-10-CM | POA: Diagnosis not present

## 2024-06-09 DIAGNOSIS — M9907 Segmental and somatic dysfunction of upper extremity: Secondary | ICD-10-CM | POA: Diagnosis not present

## 2024-06-10 DIAGNOSIS — F411 Generalized anxiety disorder: Secondary | ICD-10-CM | POA: Diagnosis not present

## 2024-06-10 DIAGNOSIS — I10 Essential (primary) hypertension: Secondary | ICD-10-CM | POA: Diagnosis not present

## 2024-06-10 DIAGNOSIS — R002 Palpitations: Secondary | ICD-10-CM | POA: Diagnosis not present

## 2024-06-13 DIAGNOSIS — M9902 Segmental and somatic dysfunction of thoracic region: Secondary | ICD-10-CM | POA: Diagnosis not present

## 2024-06-13 DIAGNOSIS — M9901 Segmental and somatic dysfunction of cervical region: Secondary | ICD-10-CM | POA: Diagnosis not present

## 2024-06-13 DIAGNOSIS — M9903 Segmental and somatic dysfunction of lumbar region: Secondary | ICD-10-CM | POA: Diagnosis not present

## 2024-06-13 DIAGNOSIS — M9907 Segmental and somatic dysfunction of upper extremity: Secondary | ICD-10-CM | POA: Diagnosis not present

## 2024-06-16 DIAGNOSIS — M9902 Segmental and somatic dysfunction of thoracic region: Secondary | ICD-10-CM | POA: Diagnosis not present

## 2024-06-16 DIAGNOSIS — M9903 Segmental and somatic dysfunction of lumbar region: Secondary | ICD-10-CM | POA: Diagnosis not present

## 2024-06-16 DIAGNOSIS — M9907 Segmental and somatic dysfunction of upper extremity: Secondary | ICD-10-CM | POA: Diagnosis not present

## 2024-06-16 DIAGNOSIS — M9901 Segmental and somatic dysfunction of cervical region: Secondary | ICD-10-CM | POA: Diagnosis not present

## 2024-06-16 DIAGNOSIS — F4321 Adjustment disorder with depressed mood: Secondary | ICD-10-CM | POA: Diagnosis not present

## 2024-06-20 DIAGNOSIS — M9903 Segmental and somatic dysfunction of lumbar region: Secondary | ICD-10-CM | POA: Diagnosis not present

## 2024-06-20 DIAGNOSIS — M9902 Segmental and somatic dysfunction of thoracic region: Secondary | ICD-10-CM | POA: Diagnosis not present

## 2024-06-20 DIAGNOSIS — M9907 Segmental and somatic dysfunction of upper extremity: Secondary | ICD-10-CM | POA: Diagnosis not present

## 2024-06-20 DIAGNOSIS — M9901 Segmental and somatic dysfunction of cervical region: Secondary | ICD-10-CM | POA: Diagnosis not present

## 2024-06-23 DIAGNOSIS — M9903 Segmental and somatic dysfunction of lumbar region: Secondary | ICD-10-CM | POA: Diagnosis not present

## 2024-06-23 DIAGNOSIS — M9901 Segmental and somatic dysfunction of cervical region: Secondary | ICD-10-CM | POA: Diagnosis not present

## 2024-06-23 DIAGNOSIS — M9907 Segmental and somatic dysfunction of upper extremity: Secondary | ICD-10-CM | POA: Diagnosis not present

## 2024-06-23 DIAGNOSIS — M9902 Segmental and somatic dysfunction of thoracic region: Secondary | ICD-10-CM | POA: Diagnosis not present

## 2024-06-27 ENCOUNTER — Telehealth: Payer: Self-pay

## 2024-06-27 DIAGNOSIS — M9902 Segmental and somatic dysfunction of thoracic region: Secondary | ICD-10-CM | POA: Diagnosis not present

## 2024-06-27 DIAGNOSIS — M9901 Segmental and somatic dysfunction of cervical region: Secondary | ICD-10-CM | POA: Diagnosis not present

## 2024-06-27 DIAGNOSIS — M9903 Segmental and somatic dysfunction of lumbar region: Secondary | ICD-10-CM | POA: Diagnosis not present

## 2024-06-27 DIAGNOSIS — M9907 Segmental and somatic dysfunction of upper extremity: Secondary | ICD-10-CM | POA: Diagnosis not present

## 2024-06-27 NOTE — Telephone Encounter (Signed)
 Pt verbally confirmed appt for 7/1

## 2024-06-28 ENCOUNTER — Inpatient Hospital Stay: Payer: BC Managed Care – PPO | Attending: Hematology and Oncology | Admitting: Hematology and Oncology

## 2024-06-28 ENCOUNTER — Inpatient Hospital Stay: Payer: BC Managed Care – PPO

## 2024-06-28 VITALS — BP 123/83 | HR 90 | Temp 98.6°F | Resp 17 | Wt 196.9 lb

## 2024-06-28 DIAGNOSIS — M25551 Pain in right hip: Secondary | ICD-10-CM | POA: Insufficient documentation

## 2024-06-28 DIAGNOSIS — Z923 Personal history of irradiation: Secondary | ICD-10-CM | POA: Diagnosis not present

## 2024-06-28 DIAGNOSIS — Z79899 Other long term (current) drug therapy: Secondary | ICD-10-CM | POA: Insufficient documentation

## 2024-06-28 DIAGNOSIS — E559 Vitamin D deficiency, unspecified: Secondary | ICD-10-CM | POA: Insufficient documentation

## 2024-06-28 DIAGNOSIS — Z79811 Long term (current) use of aromatase inhibitors: Secondary | ICD-10-CM | POA: Diagnosis not present

## 2024-06-28 DIAGNOSIS — Z170 Estrogen receptor positive status [ER+]: Secondary | ICD-10-CM | POA: Diagnosis not present

## 2024-06-28 DIAGNOSIS — Z9221 Personal history of antineoplastic chemotherapy: Secondary | ICD-10-CM | POA: Insufficient documentation

## 2024-06-28 DIAGNOSIS — K760 Fatty (change of) liver, not elsewhere classified: Secondary | ICD-10-CM | POA: Insufficient documentation

## 2024-06-28 DIAGNOSIS — E7800 Pure hypercholesterolemia, unspecified: Secondary | ICD-10-CM | POA: Diagnosis not present

## 2024-06-28 DIAGNOSIS — C50412 Malignant neoplasm of upper-outer quadrant of left female breast: Secondary | ICD-10-CM | POA: Insufficient documentation

## 2024-06-28 DIAGNOSIS — E669 Obesity, unspecified: Secondary | ICD-10-CM | POA: Diagnosis not present

## 2024-06-28 DIAGNOSIS — Z5111 Encounter for antineoplastic chemotherapy: Secondary | ICD-10-CM | POA: Insufficient documentation

## 2024-06-28 DIAGNOSIS — Z13820 Encounter for screening for osteoporosis: Secondary | ICD-10-CM

## 2024-06-28 DIAGNOSIS — K589 Irritable bowel syndrome without diarrhea: Secondary | ICD-10-CM | POA: Diagnosis not present

## 2024-06-28 MED ORDER — GOSERELIN ACETATE 3.6 MG ~~LOC~~ IMPL
3.6000 mg | DRUG_IMPLANT | SUBCUTANEOUS | Status: DC
  Administered 2024-06-28: 3.6 mg via SUBCUTANEOUS
  Filled 2024-06-28: qty 3.6

## 2024-06-28 NOTE — Assessment & Plan Note (Addendum)
 SABRA

## 2024-06-28 NOTE — Progress Notes (Signed)
 [facility] CONSULT NOTE  Patient Care Team: Burney Darice CROME, MD as PCP - General (Family Medicine) Loretha Ash, MD as Consulting Physician (Hematology and Oncology) Izell Domino, MD as Attending Physician (Radiation Oncology) Vernetta Berg, MD as Consulting Physician (General Surgery)  CHIEF COMPLAINTS/PURPOSE OF CONSULTATION:  Breast cancer follow up.  HISTORY OF PRESENTING ILLNESS:  Caitlin [name] 53 y.o. female is here because of recent diagnosis of left breast cancer.  I reviewed her records extensively and collaborated the history with the patient.  SUMMARY OF ONCOLOGIC HISTORY: Oncology History  Malignant neoplasm of upper-outer quadrant of left breast in female, estrogen receptor positive (HCC)  03/10/2022 Mammogram   Mammogram of the left breast showed irregular hypoechoic mass in the left breast.  Targeted ultrasound performed also confirmed an irregular hypoechoic mass in the left breast at 2:00 2 cm from the nipple measuring 1.1 x 0.8 x 0.5 cm.  Left axilla did not show any enlarged lymphadenopathy.   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  Prognostic showed ER 95% positive moderate staining.  PR 100% positive strong staining.  HER2 equivocal by IHC.  FISH negative    04/02/2022 Initial Diagnosis   Malignant neoplasm of upper-outer quadrant of left breast in female, estrogen receptor positive (HCC)   04/22/2022 Surgery   She had left breast lumpectomy on April 25 and pathology showed invasive ductal carcinoma Nottingham grade 2 out of 3, 1.0 cm, DCIS, intermediate grade, margins uninvolved by carcinoma at 3 out of 3 sentinel lymph nodes without any evidence of micrometastasis or micrometastasis.  Prognostics on prior biopsy showed ER +95% ER +100% staining HER2 negative, KI of less than 5%   04/25/2022 Genetic  Testing   Negative genetic testing on the CancerNext-Expanded+RNainsight panel.  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 The report date is April 25, 2022.  The CancerNext-Expanded gene panel offered by [facility] and includes sequencing and rearrangement analysis for the following 77 genes: AIP, ALK, APC*, ATM*, AXIN2, BAP1, BARD1, BLM, BMPR1A, BRCA1*, BRCA2*, BRIP1*, CDC73, CDH1*, CDK4, CDKN1B, CDKN2A, CHEK2*, CTNNA1, DICER1, FANCC, FH, FLCN, GALNT12, KIF1B, LZTR1, MAX, MEN1, MET, MLH1*, MSH2*, MSH3, MSH6*, MUTYH*, NBN, NF1*, NF2, NTHL1, PALB2*, PHOX2B, PMS2*, POT1, PRKAR1A, PTCH1, PTEN*, RAD51C*, RAD51D*, RB1, RECQL, RET, SDHA, SDHAF2, SDHB, SDHC, SDHD, SMAD4, SMARCA4, SMARCB1, SMARCE1, STK11, SUFU, TMEM127, TP53*, TSC1, TSC2, VHL and XRCC2 (sequencing and deletion/duplication); EGFR, EGLN1, HOXB13, KIT, MITF, PDGFRA, POLD1, and POLE (sequencing only); EPCAM and GREM1 (deletion/duplication only). DNA and RNA analyses performed for * genes.    05/07/2022 Oncotype testing   Oncotype recurrence score of 16, distant recurrence risk at 9 years is 4%, group average absolute chemotherapy benefit less than 1%   06/11/2022 - 07/09/2022 Radiation Therapy   Site Technique Total Dose (Gy) Dose per Fx (Gy) Completed Fx Beam Energies  Breast, Left: Breast_L 3D 40.05/40.05 2.67 15/15 10X  Breast, Left: Breast_L_Bst 3D 10/10 2 5/5 6X     07/22/2022 Cancer Staging   Staging form: Breast, AJCC 8th Edition - Clinical: Stage IA (cT1c, cN0, cM0, G2, ER+, PR+, HER2-) - Signed by Loretha Ash, MD on 07/22/2022 Histologic grading system: 3 grade system   09/22/2022 -  Anti-estrogen oral therapy   Zoladex  started on 08/25/2022 given every 4 weeks, Letrozole  began 09/22/2022    Discussed the use of AI scribe software for clinical note transcription with the patient, who gave verbal consent  to proceed.  History of Present Illness Caitlin [name] is a 53 year old female with breast cancer who presents for follow-up  regarding weight management and ongoing treatment with letrozole  and Zoladex .  She has [facility], initially following a meal plan and losing about 15 to 20 pounds. In November, she started Zepbound  and continues to lose weight, aiming for an additional 30 to 35 pounds. She attends monthly check-ins and blood work at the clinic.  She is on a daily regimen of letrozole  and receives monthly Zoladex  injections for breast cancer treatment. She experiences typical menopausal symptoms, including joint pain. To counteract these effects and improve her bone density, she engages in weight training three days a week and walks on other days, focusing on maintaining or gaining muscle mass while losing fat.  She experiences mental fog, particularly during verbal communication at work, where she struggles to find the right words. However, she finds writing more manageable as it allows more time to think.  She has right hip pain, which she attributes to tight hips. She has not pursued an MRI but has worked with her trainer on stretching exercises, which have alleviated the pain. She emphasizes the importance of stretching before and after workouts.  Rest of the pertinent 10 point ROS reviewed and negative  MEDICAL HISTORY:  Past Medical History:  Diagnosis Date   Anxiety    Back pain    Breast cancer (HCC)    Depression    Edema of both lower extremities    Elevated cholesterol    Endometriosis    Endometriosis    Family history of kidney cancer    Family history of ovarian cancer    Fatty liver    Gallbladder problem    High blood pressure    High cholesterol    History of IBS    IBS (irritable bowel syndrome)    Infertility, female    Joint pain    Personal history of radiation therapy    Vitamin D  deficiency     SURGICAL HISTORY: Past Surgical History:  Procedure Laterality Date   BREAST LUMPECTOMY     BREAST LUMPECTOMY WITH  RADIOACTIVE SEED AND SENTINEL LYMPH NODE BIOPSY Left 04/22/2022   Procedure: LEFT BREAST LUMPECTOMY WITH RADIOACTIVE SEED;  Surgeon: Vernetta Berg, MD;  Location: [facility] OR;  Service: General;  Laterality: Left;  LMA   CHOLECYSTECTOMY  2018   LAPAROSCOPY     SENTINEL NODE BIOPSY Left 04/22/2022   Procedure: LEFT SENTINEL LYMPH NODE BIOPSY;  Surgeon: Vernetta Berg, MD;  Location: [facility] OR;  Service: General;  Laterality: Left;    SOCIAL HISTORY: Social History   Socioeconomic History   Marital status: Married    Spouse name: Medford   Number of children: 3   Years of education: Not on file   Highest education level: Not on file  Occupational History   Occupation: Programmer, multimedia  Tobacco Use   Smoking status: Never   Smokeless tobacco: Never  Vaping Use   Vaping status: Never Used  Substance and Sexual Activity   Alcohol use: No   Drug use: No   Sexual activity: Yes    Birth control/protection: None  Other Topics Concern   Not on file  Social History Narrative   Right Handed   Two Story Home   Drinks Caffeine Occasionally    Social Drivers of Health   Financial Resource Strain: Low Risk  (05/01/2022)   Overall Financial  Resource Strain (CARDIA)    Difficulty of Paying Living Expenses: Not hard at all  Food Insecurity: No Food Insecurity (05/01/2022)   Hunger Vital Sign    Worried About Running Out of Food in the Last Year: Never true    Ran Out of Food in the Last Year: Never true  Transportation Needs: No Transportation Needs (05/01/2022)   PRAPARE - Administrator, Civil Service (Medical): No    Lack of Transportation (Non-Medical): No  Physical Activity: Not on file  Stress: Not on file  Social Connections: Unknown (05/13/2022)   Received [facility]   Social Network    Social Network: Not on file  Intimate Partner Violence: Unknown (04/04/2022)   Received from [facility]   HITS    Physically Hurt: Not on file    Insult or Talk Down To: Not on file     Threaten Physical Harm: Not on file    Scream or Curse: Not on file    FAMILY HISTORY: Family History  Problem Relation Age of Onset   Sleep apnea Mother    Thyroid  disease Mother    Depression Mother    Anxiety disorder Mother    Obesity Mother    Hyperlipidemia Father    Hypertension Father    Heart disease Father    Kidney disease Father    Sleep apnea Father    Heart disease Maternal Grandmother    COPD Maternal Grandmother    Heart disease Maternal Grandfather    Ovarian cancer Paternal Grandmother        dx in her 42s   Heart disease Maternal Aunt    Diabetes Maternal Aunt    Kidney cancer Maternal Aunt 70   Rheum arthritis Paternal Aunt    Cancer Cousin        unknown cancer in mat first cousin    ALLERGIES:  is allergic to ciprofloxacin and sulfa antibiotics.  MEDICATIONS:  Current Outpatient Medications  Medication Sig Dispense Refill   goserelin (ZOLADEX ) 3.6 MG injection Inject 3.6 mg into the skin every 28 (twenty-eight) days.     letrozole  (FEMARA ) 2.5 MG tablet Take 1 tablet (2.5 mg total) by mouth daily. 90 tablet 3   tirzepatide  10 MG/0.5ML injection vial Inject 10 mg into the skin once a week. 2 mL 1   WELLBUTRIN XL 150 MG 24 hr tablet Take 150 mg by mouth daily.     No current facility-administered medications for this visit.    PHYSICAL EXAMINATION: ECOG PERFORMANCE STATUS: 0 - Asymptomatic  Vitals:   06/28/24 0839  BP: 123/83  Pulse: 90  Resp: 17  Temp: 98.6 F (37 C)  SpO2: 100%    Constitutional: Denies fevers, chills or abnormal night sweats Neck: No regional adenopathy Bilateral breasts inspected.  Right breast normal to inspection and palpation.  Left breast with some lymphedema noted.  No palpable regional adenopathy or other masses.  No lower extremity swelling  Filed Weights   06/28/24 0839  Weight: 196 lb 14.4 oz (89.3 kg)       LABORATORY DATA:  I have reviewed the data as listed Lab Results  Component Value Date    WBC 8.7 04/15/2022   HGB 14.0 04/15/2022   HCT 42.2 04/15/2022   MCV 90.6 04/15/2022   PLT 282 04/15/2022   Lab Results  Component Value Date   NA 140 02/10/2024   K 3.7 02/10/2024   CL 99 02/10/2024   CO2 27 02/10/2024  RADIOGRAPHIC STUDIES: I have personally reviewed the radiological reports and agreed with the findings in the report.   ASSESSMENT AND PLAN:   Anael is a 53 year old woman with stage IA ER/PR positive breast cancer diagnosed 02/2022 s/p lumpectomy, adjuvant radiation, and Zoladex /Letrozole  that began 08/2022 here today for f/u.  Assessment and Plan Assessment & Plan Breast cancer Undergoing treatment with letrozole  and Zoladex . Recent imaging and bone density normal. - Continue letrozole  and Zoladex  as prescribed. - Order bone density test later this year.  Menopausal symptoms Joint pain and mental fog manageable with current regimen. Weight training and walking beneficial. - Engage in weight training and walking regularly.  Obesity Lost 15-20 pounds with meal plan and Zepbound . Aims to lose additional 30-35 pounds. Potential 20% weight regain post-medication. - Continue current [facility]. - Continue Zepbound  until target weight is achieved. - Monitor weight and adjust plan as necessary.   Total time spent: 30 minutes All questions were answered. The patient knows to call the clinic with any problems, questions or concerns.    Amber Stalls, MD 06/28/24

## 2024-06-28 NOTE — Progress Notes (Deleted)
 [facility] CONSULT NOTE  Patient Care Team: Burney Darice CROME, MD as PCP - General (Family Medicine) Loretha Ash, MD as Consulting Physician (Hematology and Oncology) Izell Domino, MD as Attending Physician (Radiation Oncology) Vernetta Berg, MD as Consulting Physician (General Surgery)  CHIEF COMPLAINTS/PURPOSE OF CONSULTATION:  Breast cancer follow up.  HISTORY OF PRESENTING ILLNESS:  Caitlin [name] 53 y.o. female is here because of recent diagnosis of left breast cancer.  I reviewed her records extensively and collaborated the history with the patient.  SUMMARY OF ONCOLOGIC HISTORY: Oncology History  Malignant neoplasm of upper-outer quadrant of left breast in female, estrogen receptor positive (HCC)  03/10/2022 Mammogram   Mammogram of the left breast showed irregular hypoechoic mass in the left breast.  Targeted ultrasound performed also confirmed an irregular hypoechoic mass in the left breast at 2:00 2 cm from the nipple measuring 1.1 x 0.8 x 0.5 cm.  Left axilla did not show any enlarged lymphadenopathy.   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  Prognostic showed ER 95% positive moderate staining.  PR 100% positive strong staining.  HER2 equivocal by IHC.  FISH negative    04/02/2022 Initial Diagnosis   Malignant neoplasm of upper-outer quadrant of left breast in female, estrogen receptor positive (HCC)   04/22/2022 Surgery   She had left breast lumpectomy on April 25 and pathology showed invasive ductal carcinoma Nottingham grade 2 out of 3, 1.0 cm, DCIS, intermediate grade, margins uninvolved by carcinoma at 3 out of 3 sentinel lymph nodes without any evidence of micrometastasis or micrometastasis.  Prognostics on prior biopsy showed ER +95% ER +100% staining HER2 negative, KI of less than 5%   04/25/2022 Genetic  Testing   Negative genetic testing on the CancerNext-Expanded+RNainsight panel.  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 The report date is April 25, 2022.  The CancerNext-Expanded gene panel offered by [facility] and includes sequencing and rearrangement analysis for the following 77 genes: AIP, ALK, APC*, ATM*, AXIN2, BAP1, BARD1, BLM, BMPR1A, BRCA1*, BRCA2*, BRIP1*, CDC73, CDH1*, CDK4, CDKN1B, CDKN2A, CHEK2*, CTNNA1, DICER1, FANCC, FH, FLCN, GALNT12, KIF1B, LZTR1, MAX, MEN1, MET, MLH1*, MSH2*, MSH3, MSH6*, MUTYH*, NBN, NF1*, NF2, NTHL1, PALB2*, PHOX2B, PMS2*, POT1, PRKAR1A, PTCH1, PTEN*, RAD51C*, RAD51D*, RB1, RECQL, RET, SDHA, SDHAF2, SDHB, SDHC, SDHD, SMAD4, SMARCA4, SMARCB1, SMARCE1, STK11, SUFU, TMEM127, TP53*, TSC1, TSC2, VHL and XRCC2 (sequencing and deletion/duplication); EGFR, EGLN1, HOXB13, KIT, MITF, PDGFRA, POLD1, and POLE (sequencing only); EPCAM and GREM1 (deletion/duplication only). DNA and RNA analyses performed for * genes.    05/07/2022 Oncotype testing   Oncotype recurrence score of 16, distant recurrence risk at 9 years is 4%, group average absolute chemotherapy benefit less than 1%   06/11/2022 - 07/09/2022 Radiation Therapy   Site Technique Total Dose (Gy) Dose per Fx (Gy) Completed Fx Beam Energies  Breast, Left: Breast_L 3D 40.05/40.05 2.67 15/15 10X  Breast, Left: Breast_L_Bst 3D 10/10 2 5/5 6X     07/22/2022 Cancer Staging   Staging form: Breast, AJCC 8th Edition - Clinical: Stage IA (cT1c, cN0, cM0, G2, ER+, PR+, HER2-) - Signed by Loretha Ash, MD on 07/22/2022 Histologic grading system: 3 grade system   09/22/2022 -  Anti-estrogen oral therapy   Zoladex  started on 08/25/2022 given every 4 weeks, Letrozole  began 09/22/2022     Discussed the use of AI scribe software for clinical note transcription with the patient, who gave verbal  consent to proceed.  History of Present Illness Caitlin [name] is a 53 year old female with breast cancer who presents for follow-up  regarding weight management and ongoing treatment with letrozole  and Zoladex .  She has been [facility] participating [facility], initially following a meal plan and losing about 15 to 20 pounds. In November, she started Zepbound  and continues to lose weight, aiming for an additional 30 to 35 pounds. She attends monthly check-ins and blood work at the clinic.  She is on a daily regimen of letrozole  and receives monthly Zoladex  injections for breast cancer treatment. She experiences typical menopausal symptoms, including joint pain. To counteract these effects and improve her bone density, she engages in weight training three days a week and walks on other days, focusing on maintaining or gaining muscle mass while losing fat.  She experiences mental fog, particularly during verbal communication at work, where she struggles to find the right words. However, she finds writing more manageable as it allows more time to think.  She has right hip pain, which she attributes to tight hips. She has not pursued an MRI but has worked with her trainer on stretching exercises, which have alleviated the pain. She emphasizes the importance of stretching before and after workouts.   Rest of the pertinent 10 point ROS reviewed and negative  MEDICAL HISTORY:  Past Medical History:  Diagnosis Date   Anxiety    Back pain    Breast cancer (HCC)    Depression    Edema of both lower extremities    Elevated cholesterol    Endometriosis    Endometriosis    Family history of kidney cancer    Family history of ovarian cancer    Fatty liver    Gallbladder problem    High blood pressure    High cholesterol    History of IBS    IBS (irritable bowel syndrome)    Infertility, female    Joint pain    Personal history of radiation therapy    Vitamin D  deficiency     SURGICAL HISTORY: Past Surgical History:  Procedure Laterality Date   BREAST LUMPECTOMY     BREAST LUMPECTOMY WITH  RADIOACTIVE SEED AND SENTINEL LYMPH NODE BIOPSY Left 04/22/2022   Procedure: LEFT BREAST LUMPECTOMY WITH RADIOACTIVE SEED;  Surgeon: Vernetta Berg, MD;  Location: [facility] OR;  Service: General;  Laterality: Left;  LMA   CHOLECYSTECTOMY  2018   LAPAROSCOPY     SENTINEL NODE BIOPSY Left 04/22/2022   Procedure: LEFT SENTINEL LYMPH NODE BIOPSY;  Surgeon: Vernetta Berg, MD;  Location: [facility] OR;  Service: General;  Laterality: Left;    SOCIAL HISTORY: Social History   Socioeconomic History   Marital status: Married    Spouse name: Medford   Number of children: 3   Years of education: Not on file   Highest education level: Not on file  Occupational History   Occupation: Programmer, multimedia  Tobacco Use   Smoking status: Never   Smokeless tobacco: Never  Vaping Use   Vaping status: Never Used  Substance and Sexual Activity   Alcohol use: No   Drug use: No   Sexual activity: Yes    Birth control/protection: None  Other Topics Concern   Not on file  Social History Narrative   Right Handed   Two Story Home   Drinks Caffeine Occasionally    Social Drivers of Health   Financial Resource Strain: Low Risk  (05/01/2022)  Overall Financial Resource Strain (CARDIA)    Difficulty of Paying Living Expenses: Not hard at all  Food Insecurity: No Food Insecurity (05/01/2022)   Hunger Vital Sign    Worried About Running Out of Food in the Last Year: Never true    Ran Out of Food in the Last Year: Never true  Transportation Needs: No Transportation Needs (05/01/2022)   PRAPARE - Administrator, Civil Service (Medical): No    Lack of Transportation (Non-Medical): No  Physical Activity: Not on file  Stress: Not on file  Social Connections: Unknown (05/13/2022)   [facility]   Social Network    Social Network: Not on file  Intimate Partner Violence: Unknown (04/04/2022)   Received from [facility]   HITS    Physically Hurt: Not on file    Insult or Talk Down To: Not on file     Threaten Physical Harm: Not on file    Scream or Curse: Not on file    FAMILY HISTORY: Family History  Problem Relation Age of Onset   Sleep apnea Mother    Thyroid  disease Mother    Depression Mother    Anxiety disorder Mother    Obesity Mother    Hyperlipidemia Father    Hypertension Father    Heart disease Father    Kidney disease Father    Sleep apnea Father    Heart disease Maternal Grandmother    COPD Maternal Grandmother    Heart disease Maternal Grandfather    Ovarian cancer Paternal Grandmother        dx in her 76s   Heart disease Maternal Aunt    Diabetes Maternal Aunt    Kidney cancer Maternal Aunt 38   Rheum arthritis Paternal Aunt    Cancer Cousin        unknown cancer in mat first cousin    ALLERGIES:  is allergic to ciprofloxacin and sulfa antibiotics.  MEDICATIONS:  Current Outpatient Medications  Medication Sig Dispense Refill   goserelin (ZOLADEX ) 3.6 MG injection Inject 3.6 mg into the skin every 28 (twenty-eight) days.     letrozole  (FEMARA ) 2.5 MG tablet Take 1 tablet (2.5 mg total) by mouth daily. 90 tablet 3   tirzepatide  10 MG/0.5ML injection vial Inject 10 mg into the skin once a week. 2 mL 1   WELLBUTRIN XL 150 MG 24 hr tablet Take 150 mg by mouth daily.     No current facility-administered medications for this visit.    PHYSICAL EXAMINATION: ECOG PERFORMANCE STATUS: 0 - Asymptomatic  Vitals:   06/28/24 0839  BP: 123/83  Pulse: 90  Resp: 17  Temp: 98.6 F (37 C)  SpO2: 100%    Constitutional: She appears well. Neck: No regional adenopathy Bilateral breasts inspected.  Scattered density in upper outer quadrant bilateral breasts. Left breast with scar tissue around surgical incision. No palpable masses otherwise. No regional adenopathy No lower extremity swelling  Filed Weights   06/28/24 0839  Weight: 196 lb 14.4 oz (89.3 kg)   LABORATORY DATA:  I have reviewed the data as listed Lab Results  Component Value Date   WBC 8.7  04/15/2022   HGB 14.0 04/15/2022   HCT 42.2 04/15/2022   MCV 90.6 04/15/2022   PLT 282 04/15/2022   Lab Results  Component Value Date   NA 140 02/10/2024   K 3.7 02/10/2024   CL 99 02/10/2024   CO2 27 02/10/2024    RADIOGRAPHIC STUDIES: I have personally  reviewed the radiological reports and agreed with the findings in the report.   Total time spent: 30 minutes All questions were answered. The patient knows to call the clinic with any problems, questions or concerns.    Amber Stalls, MD 06/28/24

## 2024-06-29 DIAGNOSIS — M9902 Segmental and somatic dysfunction of thoracic region: Secondary | ICD-10-CM | POA: Diagnosis not present

## 2024-06-29 DIAGNOSIS — M9903 Segmental and somatic dysfunction of lumbar region: Secondary | ICD-10-CM | POA: Diagnosis not present

## 2024-06-29 DIAGNOSIS — M9907 Segmental and somatic dysfunction of upper extremity: Secondary | ICD-10-CM | POA: Diagnosis not present

## 2024-06-29 DIAGNOSIS — M9901 Segmental and somatic dysfunction of cervical region: Secondary | ICD-10-CM | POA: Diagnosis not present

## 2024-07-06 DIAGNOSIS — F4321 Adjustment disorder with depressed mood: Secondary | ICD-10-CM | POA: Diagnosis not present

## 2024-07-07 DIAGNOSIS — M9907 Segmental and somatic dysfunction of upper extremity: Secondary | ICD-10-CM | POA: Diagnosis not present

## 2024-07-07 DIAGNOSIS — M9902 Segmental and somatic dysfunction of thoracic region: Secondary | ICD-10-CM | POA: Diagnosis not present

## 2024-07-07 DIAGNOSIS — M9901 Segmental and somatic dysfunction of cervical region: Secondary | ICD-10-CM | POA: Diagnosis not present

## 2024-07-07 DIAGNOSIS — M9903 Segmental and somatic dysfunction of lumbar region: Secondary | ICD-10-CM | POA: Diagnosis not present

## 2024-07-11 ENCOUNTER — Ambulatory Visit (INDEPENDENT_AMBULATORY_CARE_PROVIDER_SITE_OTHER): Admitting: Family Medicine

## 2024-07-11 ENCOUNTER — Encounter: Payer: Self-pay | Admitting: Family Medicine

## 2024-07-11 VITALS — BP 111/77 | HR 68 | Temp 97.9°F | Ht 65.0 in | Wt 191.0 lb

## 2024-07-11 DIAGNOSIS — R12 Heartburn: Secondary | ICD-10-CM

## 2024-07-11 DIAGNOSIS — E66811 Obesity, class 1: Secondary | ICD-10-CM

## 2024-07-11 DIAGNOSIS — Z6831 Body mass index (BMI) 31.0-31.9, adult: Secondary | ICD-10-CM | POA: Diagnosis not present

## 2024-07-11 DIAGNOSIS — E88819 Insulin resistance, unspecified: Secondary | ICD-10-CM

## 2024-07-11 MED ORDER — TIRZEPATIDE-WEIGHT MANAGEMENT 10 MG/0.5ML ~~LOC~~ SOLN: 10.0000 mg | SUBCUTANEOUS | 1 refills | Status: DC

## 2024-07-11 MED ORDER — FAMOTIDINE 40 MG PO TABS: 40.0000 mg | ORAL_TABLET | Freq: Every day | ORAL | 0 refills | Status: DC

## 2024-07-11 NOTE — Progress Notes (Signed)
 Office: [phone]  /  Fax: [phone]  WEIGHT SUMMARY AND BIOMETRICS  Starting Date: 08/26/23  Starting Weight: 241lb   Weight Lost Since Last Visit: 4lb   Vitals Temp: 97.9 F (36.6 C) BP: 111/77 Pulse Rate: 68 SpO2: 100 %   Body Composition  Body Fat %: 40.5 % Fat Mass (lbs): 77.6 lbs Muscle Mass (lbs): 108.4 lbs Total Body Water (lbs): 75.6 lbs Visceral Fat Rating : 10     HPI  Chief Complaint: OBESITY  Caitlin [name] is here to discuss her progress with her obesity treatment plan. She is on the the Category 1 Plan and states she is following her eating plan approximately 50 % of the time. She states she is exercising 30-60 minutes 3-7 times per week.   Interval History:  Since last office visit she is down 4 lb This gives her a net weight loss of 50 lb in 10 mos of medically supervised weight management This is a 20% TBW loss Of her weight lost in 10 mos, 10.2 lb has been muscle loss which is 20% of her total body weight loss She has been consistent with walking and weight training 4-6 days/ wk She is feeling adequate satiety with Zepbound  10 mg weekly She has a little queasiness the first 2 days post injection She has occasional acid reflux needing Pepcid  about once a week She is going to the beach in August She is not needing to track her calories She has seen a reduction in headaches in severity and frequency  Pharmacotherapy: Zepbound  10 mg weekly, cash pay  PHYSICAL EXAM:  Blood pressure 111/77, pulse 68, temperature 97.9 F (36.6 C), height 5' 5 (1.651 m), weight 191 lb (86.6 kg), SpO2 100%. Body mass index is 31.78 kg/m.  General: She is overweight, cooperative, alert, well developed, and in no acute distress. PSYCH: Has normal mood, affect and thought process.   Lungs: Normal breathing effort, no conversational dyspnea.   ASSESSMENT AND PLAN  TREATMENT PLAN FOR OBESITY:  Recommended Dietary Goals  Caitlin [name] is currently in the action  stage of change. As such, her goal is to continue weight management plan. She has agreed to keeping a food journal and adhering to recommended goals of 1500 calories and 90 g of  protein and practicing portion control and making smarter food choices, such as increasing vegetables and decreasing simple carbohydrates.  Behavioral Intervention  We discussed the following Behavioral Modification Strategies today: increasing fiber rich foods, increasing water intake , work on meal planning and preparation, reading food labels , keeping healthy foods at home, work on managing stress, creating time for self-care and relaxation, avoiding temptations and identifying enticing environmental cues, and continue to work on maintaining a reduced calorie state, getting the recommended amount of protein, incorporating whole foods, making healthy choices, staying well hydrated and practicing mindfulness when eating..  Additional resources provided today: NA  Recommended Physical Activity Goals  Caitlin [name] has been advised to work up to 150 minutes of moderate intensity aerobic activity a week and strengthening exercises 2-3 times per week for cardiovascular health, weight loss maintenance and preservation of muscle mass.   She has agreed to Work on scheduling and tracking physical activity.   Pharmacotherapy changes for the treatment of obesity: none  ASSOCIATED CONDITIONS ADDRESSED TODAY  Insulin  resistance Continue to work on weight reduction with a reduced kcal low sugar diet Great job ramping up both cardio and resistance training exercise! Doing great on Zepbound  for obesity management Repeat fasting insulin   next visit Consider use of metformin  Class 1 obesity due to excess calories with body mass index (BMI) of 31.0 to 31.9 in adult, unspecified whether serious comorbidity present -     Tirzepatide -Weight Management; Inject 10 mg into the skin once a week.  Dispense: 2 mL; Refill: 1 Adequate satiety  and weight reduction without meal skipping from Zepbound  10 mg weekly Muscle loss is <25% of total body weight lost in 10 mos  Heartburn -     Famotidine ; Take 1 tablet (40 mg total) by mouth at bedtime.  Dispense: 30 tablet; Refill: 0 Worsened by Zepbound  Not using anything regularly Has symptoms during exercise  Add Famotidine  40 mg at bedtime daily     She was informed of the importance of frequent follow up visits to maximize her success with intensive lifestyle modifications for her multiple health conditions.   ATTESTASTION STATEMENTS:  Reviewed by clinician on day of visit: allergies, medications, problem list, medical history, surgical history, family history, social history, and previous encounter notes pertinent to obesity diagnosis.      Caitlin [name], D.O. DABFM, DABOM [facility] [address] [place], [place] [address] [phone]

## 2024-07-21 DIAGNOSIS — I10 Essential (primary) hypertension: Secondary | ICD-10-CM | POA: Diagnosis not present

## 2024-07-21 DIAGNOSIS — Z0001 Encounter for general adult medical examination with abnormal findings: Secondary | ICD-10-CM | POA: Diagnosis not present

## 2024-07-21 DIAGNOSIS — R5383 Other fatigue: Secondary | ICD-10-CM | POA: Diagnosis not present

## 2024-07-29 ENCOUNTER — Inpatient Hospital Stay: Attending: Hematology and Oncology

## 2024-07-29 VITALS — BP 127/92 | HR 100 | Temp 98.0°F | Resp 18

## 2024-07-29 DIAGNOSIS — C50412 Malignant neoplasm of upper-outer quadrant of left female breast: Secondary | ICD-10-CM | POA: Diagnosis not present

## 2024-07-29 DIAGNOSIS — Z5111 Encounter for antineoplastic chemotherapy: Secondary | ICD-10-CM | POA: Insufficient documentation

## 2024-07-29 MED ORDER — GOSERELIN ACETATE 3.6 MG ~~LOC~~ IMPL
3.6000 mg | DRUG_IMPLANT | Freq: Once | SUBCUTANEOUS | Status: AC
Start: 2024-07-29 — End: 2024-07-29
  Administered 2024-07-29: 3.6 mg via SUBCUTANEOUS
  Filled 2024-07-29: qty 3.6

## 2024-08-02 MED ORDER — ZEPBOUND 12.5 MG/0.5ML ~~LOC~~ SOLN: 12.5000 mg | SUBCUTANEOUS | 0 refills | Status: DC

## 2024-08-02 NOTE — Addendum Note (Signed)
 Addended by: WAYLAN DARICE BRAVO on: 08/02/2024 07:52 AM   Modules accepted: Orders

## 2024-08-03 DIAGNOSIS — F4321 Adjustment disorder with depressed mood: Secondary | ICD-10-CM | POA: Diagnosis not present

## 2024-08-10 ENCOUNTER — Ambulatory Visit: Payer: BC Managed Care – PPO | Admitting: Neurology

## 2024-08-15 NOTE — Progress Notes (Unsigned)
 NEUROLOGY FOLLOW UP OFFICE NOTE  Caitlin [name] [id number]  Assessment/Plan:   Idiopathic intracranial hypertension - stable off Lasix . Migraine with aura, without status migrainosus, not intractable, stable    Remain off Lasix .  Follow up with Dr. Octavia in 2 years, sooner if she begins experiencing recurrence of symptoms. Otherwise, follow up 6 months.  Subjective:  Caitlin [name] is a 53 year old right-handed female who follows up for migraines and mild idiopathic intracranial hypertension   UPDATE: Current NSAIDs/analgesics:  ibuprofen Current preventative: none  She had repeat eye exam at [facility] in March.  VF/OCT stable.    HISTORY: She has only had one dull headache since starting the topiramate , when it was snowing, and lasted a couple of hours and didn't require any medication.  Pulsating sensation has decreased significantly.  She may hear it once in a while.  If she stands up quickly or bends over, she may get the head pressure briefly.  She saw Dr. Octavia again and showed minimal improvement in optic nerve but overall stable     HISTORY: She has longstanding history of migraine presenting with visual aura of wavy lines followed by blind spot preceding headache.  Several years ago, she was started on Mirena and developed bilateral pulsatile tinnitus.  She began having new headaches in the Fall of 2020.  They are severe right occipital shooting pain associated with nausea, photophobia, and phonophobia but not associated with speech disturbance, dizziness, numbness or weakness.  She treats with ibuprofen.  In April 2021, she had a cough that triggered a particularly severe occipital headache.  It was aggravated by change in position, either standing up or bending over.  The severe headache subsided but she had a persistent dull right occipital pressure that lasted 2 weeks.  Other than her typical headaches, she has not had a recurrence of that occipital headache  since then.  CT of head on 04/26/2020 was unremarkable.  She had an MRI of brain and internal auditory canals on 06/05/2020 which was normal.  She saw her ophthalmologist, Dr. 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  Visual fields were full.  Repeat exam on 07/05/2020 was stable.  She denies visual obscurations.  She underwent workup for pulsatile tinnitus and idiopathic intracranial hypertension.  TSH from 05/12/2020 was 0.60.  MRA of head and neck on 08/29/2020 was normal.  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  She underwent LP on 09/06/2020 which demonstrated opening pressure of 23 cm water and closing pressure of 17 cm water.    Did not tolerate acetazolamide or topiramate .  Started on furosemide  and had lost 30 lbs.  Repeat eye exam with Dr. Octavia in March 2024 was negative for papilledema and with stable VF/OCT.  She stopped furosemide  in May 2024  Past medications:  topiramate  (hair loss), acetazolamide (side effects), furosemide  20mg  daily (off since May 2024)  PAST MEDICAL HISTORY: Past Medical History:  Diagnosis Date   Anxiety    Back pain    Breast cancer (HCC)    Depression    Edema of both lower extremities    Elevated cholesterol    Endometriosis    Endometriosis    Family history of kidney cancer    Family history of ovarian cancer    Fatty liver    Gallbladder problem    High blood pressure    High cholesterol  History of IBS    IBS (irritable bowel syndrome)    Infertility, female    Joint pain    Personal history of radiation therapy    Vitamin D  deficiency     MEDICATIONS: Current Outpatient Medications on File Prior to Visit  Medication Sig Dispense Refill   famotidine  (PEPCID ) 40 MG tablet Take 1 tablet (40 mg total) by mouth at bedtime. 30 tablet 0   goserelin (ZOLADEX ) 3.6 MG injection Inject 3.6 mg into the skin every 28 (twenty-eight) days.     letrozole   (FEMARA ) 2.5 MG tablet Take 1 tablet (2.5 mg total) by mouth daily. 90 tablet 3   tirzepatide  10 MG/0.5ML injection vial Inject 10 mg into the skin once a week. 2 mL 1   Tirzepatide -Weight Management (ZEPBOUND ) 12.5 MG/0.5ML SOLN Inject 12.5 mg into the skin once a week. 2 mL 0   WELLBUTRIN XL 150 MG 24 hr tablet Take 150 mg by mouth daily.     No current facility-administered medications on file prior to visit.    ALLERGIES: Allergies  Allergen Reactions   Ciprofloxacin Other (See Comments)    achy joints achy joints    Sulfa Antibiotics Rash    FAMILY HISTORY: Family History  Problem Relation Age of Onset   Sleep apnea Mother    Thyroid  disease Mother    Depression Mother    Anxiety disorder Mother    Obesity Mother    Hyperlipidemia Father    Hypertension Father    Heart disease Father    Kidney disease Father    Sleep apnea Father    Heart disease Maternal Grandmother    COPD Maternal Grandmother    Heart disease Maternal Grandfather    Ovarian cancer Paternal Grandmother        dx in her 64s   Heart disease Maternal Aunt    Diabetes Maternal Aunt    Kidney cancer Maternal Aunt 39   Rheum arthritis Paternal Aunt    Cancer Cousin        unknown cancer in mat first cousin      Objective:  *** General: No acute distress.  Patient appears well-groomed.   ***    Caitlin Dunnings, DO  CC: Caitlin Henle, MD

## 2024-08-16 ENCOUNTER — Encounter: Payer: Self-pay | Admitting: Neurology

## 2024-08-16 ENCOUNTER — Ambulatory Visit (INDEPENDENT_AMBULATORY_CARE_PROVIDER_SITE_OTHER): Admitting: Neurology

## 2024-08-16 VITALS — BP 128/83 | HR 86 | Ht 65.0 in | Wt 195.0 lb

## 2024-08-16 DIAGNOSIS — G43109 Migraine with aura, not intractable, without status migrainosus: Secondary | ICD-10-CM

## 2024-08-16 DIAGNOSIS — G932 Benign intracranial hypertension: Secondary | ICD-10-CM

## 2024-08-19 ENCOUNTER — Other Ambulatory Visit: Payer: Self-pay | Admitting: Family Medicine

## 2024-08-19 DIAGNOSIS — E6609 Other obesity due to excess calories: Secondary | ICD-10-CM

## 2024-08-23 ENCOUNTER — Ambulatory Visit (INDEPENDENT_AMBULATORY_CARE_PROVIDER_SITE_OTHER): Admitting: Family Medicine

## 2024-08-23 ENCOUNTER — Encounter: Payer: Self-pay | Admitting: Family Medicine

## 2024-08-23 VITALS — BP 114/79 | HR 76 | Ht 65.0 in | Wt 187.0 lb

## 2024-08-23 DIAGNOSIS — G932 Benign intracranial hypertension: Secondary | ICD-10-CM | POA: Diagnosis not present

## 2024-08-23 DIAGNOSIS — R12 Heartburn: Secondary | ICD-10-CM | POA: Diagnosis not present

## 2024-08-23 DIAGNOSIS — E66811 Obesity, class 1: Secondary | ICD-10-CM | POA: Diagnosis not present

## 2024-08-23 DIAGNOSIS — Z6831 Body mass index (BMI) 31.0-31.9, adult: Secondary | ICD-10-CM

## 2024-08-23 DIAGNOSIS — E88819 Insulin resistance, unspecified: Secondary | ICD-10-CM | POA: Diagnosis not present

## 2024-08-23 MED ORDER — FAMOTIDINE 40 MG PO TABS: 40.0000 mg | ORAL_TABLET | Freq: Every day | ORAL | 0 refills | Status: AC

## 2024-08-23 MED ORDER — ZEPBOUND 12.5 MG/0.5ML ~~LOC~~ SOLN: 12.5000 mg | SUBCUTANEOUS | 0 refills | Status: DC

## 2024-08-23 NOTE — Progress Notes (Signed)
 Office: [phone]  /  Fax: [phone]  WEIGHT SUMMARY AND BIOMETRICS  Starting Date: 08/26/23  Starting Weight: 241lb   Weight Lost Since Last Visit: 4lb   Vitals BP: 114/79 Pulse Rate: 76 SpO2: 99 %   Body Composition  Body Fat %: 39.2 % Fat Mass (lbs): 73.4 lbs Muscle Mass (lbs): 108 lbs Total Body Water (lbs): 74.8 lbs Visceral Fat Rating : 9     HPI  Chief Complaint: OBESITY  Caitlin [name] is here to discuss her progress with her obesity treatment plan. She is on the the Category 2 Plan and states she is following her eating plan approximately 70 % of the time. She states she is exercising 60 minutes 5 times per week.   Interval History:  Since last office visit she is down 4 lb She is down 0.4 lb of muscle mass and is down 4.2 lb of body fat She is doing more walking and weight training She is net down 54 lb in the past 11 mos of medically supervised weight management This is a 22% TBW loss She has had a decrease in headaches with IIH Her kids are back in school  She has done 2 injections of Zepbound  12.5 mg weekly with improved satiety and w/o GI upset She has been more prone to meal skipping on this dose  Pharmacotherapy: Zepbound  12.5 mg weekly thru [facility]  PHYSICAL EXAM:  Blood pressure 114/79, pulse 76, height 5' 5 (1.651 m), weight 187 lb (84.8 kg), SpO2 99%. Body mass index is 31.12 kg/m.  General: She is overweight, cooperative, alert, well developed, and in no acute distress. PSYCH: Has normal mood, affect and thought process.   Lungs: Normal breathing effort, no conversational dyspnea.   ASSESSMENT AND PLAN  TREATMENT PLAN FOR OBESITY:  Recommended Dietary Goals  Caitlin [name] is currently in the action stage of change. As such, her goal is to continue weight management plan. She has agreed to the Category 3 Plan and keeping a food journal and adhering to recommended goals of 1500 calories and 90+ g of  protein.  Behavioral  Intervention  We discussed the following Behavioral Modification Strategies today: increasing lean protein intake to established goals, increasing vegetables, increasing water intake , work on meal planning and preparation, keeping healthy foods at home, avoiding temptations and identifying enticing environmental cues, and planning for success.  Additional resources provided today: NA  Recommended Physical Activity Goals  Caitlin [name] has been advised to work up to 150 minutes of moderate intensity aerobic activity a week and strengthening exercises 2-3 times per week for cardiovascular health, weight loss maintenance and preservation of muscle mass.   She has agreed to Continue current level of physical activity   Pharmacotherapy changes for the treatment of obesity: none  ASSOCIATED CONDITIONS ADDRESSED TODAY  Insulin  resistance 2/12 fasting insulin  high at 20.6, down from 24.4 Working on weight loss, reducing added sugar and starch intake Doing well on Zepbound  for obesity management Repeat lab today and consider change to metformin once she is off of Zepbound  -     Insulin , random -     Comprehensive metabolic panel with GFR  Class 1 obesity due to excess calories with body mass index (BMI) of 31.0 to 31.9 in adult, unspecified whether serious comorbidity present -     Zepbound ; Inject 12.5 mg into the skin once a week.  Dispense: 2 mL; Refill: 0 Consider reducing dose of Zepbound  to 10 mg weekly if unable to get in meals  due to excess satiety from the 12.5 mg dose.  She will see if the effectiveness reduces over the next 2 weeks.  Heartburn Improved on famotidine  40 mg at bedtime -     Famotidine ; Take 1 tablet (40 mg total) by mouth at bedtime.  Dispense: 30 tablet; Refill: 0  Benign intracranial hypertension Headache frequency has reduced with weight loss Doing well with exercise, nutrition and hydration She is down 22% of there total body weight in 11 mos of medically  supervised weight management Keep  neurology visits as scheduled    She was informed of the importance of frequent follow up visits to maximize her success with intensive lifestyle modifications for her multiple health conditions.   ATTESTASTION STATEMENTS:  Reviewed by clinician on day of visit: allergies, medications, problem list, medical history, surgical history, family history, social history, and previous encounter notes pertinent to obesity diagnosis.   I have personally spent 30 minutes total time today in preparation, patient care, nutritional counseling and education,  and documentation for this visit, including the following: review of most recent clinical lab tests, prescribing medications/ refilling medications, reviewing medical assistant documentation, review and interpretation of bioimpedence results.     Caitlin [name], D.O. DABFM, DABOM [facility] [address] [place], [place] [address] [phone]

## 2024-08-24 ENCOUNTER — Telehealth: Payer: Self-pay | Admitting: Hematology and Oncology

## 2024-08-24 LAB — COMPREHENSIVE METABOLIC PANEL WITH GFR
ALT: 14 IU/L (ref 0–32)
AST: 20 IU/L (ref 0–40)
Albumin: 4.3 g/dL (ref 3.8–4.9)
Alkaline Phosphatase: 101 IU/L (ref 44–121)
BUN/Creatinine Ratio: 24 — ABNORMAL HIGH (ref 9–23)
BUN: 19 mg/dL (ref 6–24)
Bilirubin Total: 0.4 mg/dL (ref 0.0–1.2)
CO2: 20 mmol/L (ref 20–29)
Calcium: 9.8 mg/dL (ref 8.7–10.2)
Chloride: 101 mmol/L (ref 96–106)
Creatinine, Ser: 0.8 mg/dL (ref 0.57–1.00)
Globulin, Total: 3.1 g/dL (ref 1.5–4.5)
Glucose: 81 mg/dL (ref 70–99)
Potassium: 4.2 mmol/L (ref 3.5–5.2)
Sodium: 139 mmol/L (ref 134–144)
Total Protein: 7.4 g/dL (ref 6.0–8.5)
eGFR: 89 mL/min/1.73 (ref >59)

## 2024-08-24 LAB — INSULIN, RANDOM: INSULIN: 9.4 u[IU]/mL (ref 2.6–24.9)

## 2024-08-25 ENCOUNTER — Ambulatory Visit: Payer: Self-pay | Admitting: Family Medicine

## 2024-08-30 ENCOUNTER — Inpatient Hospital Stay: Attending: Hematology and Oncology

## 2024-08-30 ENCOUNTER — Inpatient Hospital Stay

## 2024-08-30 VITALS — BP 128/97 | HR 75 | Temp 99.1°F | Resp 18

## 2024-08-30 DIAGNOSIS — C50412 Malignant neoplasm of upper-outer quadrant of left female breast: Secondary | ICD-10-CM | POA: Insufficient documentation

## 2024-08-30 DIAGNOSIS — Z5111 Encounter for antineoplastic chemotherapy: Secondary | ICD-10-CM | POA: Insufficient documentation

## 2024-08-30 DIAGNOSIS — Z1721 Progesterone receptor positive status: Secondary | ICD-10-CM | POA: Diagnosis not present

## 2024-08-30 DIAGNOSIS — Z170 Estrogen receptor positive status [ER+]: Secondary | ICD-10-CM | POA: Diagnosis not present

## 2024-08-30 DIAGNOSIS — Z1732 Human epidermal growth factor receptor 2 negative status: Secondary | ICD-10-CM | POA: Diagnosis not present

## 2024-08-30 MED ORDER — GOSERELIN ACETATE 3.6 MG ~~LOC~~ IMPL
3.6000 mg | DRUG_IMPLANT | SUBCUTANEOUS | Status: DC
  Administered 2024-08-30: 3.6 mg via SUBCUTANEOUS
  Filled 2024-08-30: qty 3.6

## 2024-09-12 NOTE — Progress Notes (Signed)
  Cardiology Office Note:   Date:  09/12/2024  ID:  Caitlin [name], DOB 1971-11-21, MRN [id number] PCP: Burney Darice CROME, MD  [facility] Providers Cardiologist:  None { Chief Complaint: No chief complaint on file.     History of Present Illness:   Caitlin [name] is a 53 y.o. female with a PMH of HTN, HLD, obesity, intracranial hypertension and left breast CA who presents as a new patient referral by Dr. Burney for palpitations.  LDL 06/04/24 = 153.  Past Medical History:  Diagnosis Date   Anxiety    Back pain    Breast cancer (HCC)    Depression    Edema of both lower extremities    Elevated cholesterol    Endometriosis    Endometriosis    Family history of kidney cancer    Family history of ovarian cancer    Fatty liver    Gallbladder problem    High blood pressure    High cholesterol    History of IBS    IBS (irritable bowel syndrome)    Infertility, female    Joint pain    Personal history of radiation therapy    Vitamin D  deficiency      Studies Reviewed:    EKG: ***           Risk Assessment/Calculations:   {Does this patient have ATRIAL FIBRILLATION?[phone]} No BP recorded.  {Refresh Note OR Click here to enter BP  :1}***        Physical Exam:     VS:  There were no vitals taken for this visit. ***    Wt Readings from Last 3 Encounters:  08/23/24 187 lb (84.8 kg)  08/16/24 195 lb (88.5 kg)  07/11/24 191 lb (86.6 kg)     GEN: Well nourished, well developed, in no acute distress NECK: No JVD; No carotid bruits CARDIAC: ***RRR, no murmurs, rubs, gallops RESPIRATORY:  Clear to auscultation without rales, wheezing or rhonchi  ABDOMEN: Soft, non-tender, non-distended, normal bowel sounds EXTREMITIES:  Warm and well perfused, no edema; No deformity, 2+ radial pulses PSYCH: Normal mood and affect   Assessment & Plan       {Are you ordering a CV Procedure (e.g. stress test, cath, DCCV, TEE, etc)?   Press F2        :[id number]}    This note was written with the assistance of a dictation microphone or AI dictation software. Please excuse any typos or grammatical errors.   Signed, Georganna Archer, MD 09/12/2024 4:11 PM    [facility]

## 2024-09-13 ENCOUNTER — Encounter: Payer: Self-pay | Admitting: Student in an Organized Health Care Education/Training Program

## 2024-09-13 ENCOUNTER — Ambulatory Visit (INDEPENDENT_AMBULATORY_CARE_PROVIDER_SITE_OTHER)

## 2024-09-13 ENCOUNTER — Ambulatory Visit
Attending: Student in an Organized Health Care Education/Training Program | Admitting: Student in an Organized Health Care Education/Training Program

## 2024-09-13 VITALS — BP 116/85 | HR 76 | Ht 65.0 in | Wt 186.0 lb

## 2024-09-13 DIAGNOSIS — E782 Mixed hyperlipidemia: Secondary | ICD-10-CM | POA: Diagnosis not present

## 2024-09-13 DIAGNOSIS — R002 Palpitations: Secondary | ICD-10-CM

## 2024-09-13 NOTE — Assessment & Plan Note (Signed)
 Last LDL was fairly elevated.  She has lost a significant amount of weight since then.  I will check a repeat lipid panel and lipoprotein a today. -Lipid panel - Lipoprotein a

## 2024-09-13 NOTE — Patient Instructions (Signed)
  Lab Work: Lipid panel LP(a)  If you have labs (blood work) drawn today and your tests are completely normal, you will receive your results only by: MyChart Message (if you have MyChart) OR A paper copy in the mail If you have any lab test that is abnormal or we need to change your treatment, we will call you to review the results.  Testing/Procedures: 2 week ZIO  Your physician has requested that you wear a Zio heart monitor for __14___ days. This will be mailed to your home with instructions on how to apply the monitor and how to return it when finished. Please allow 2 weeks after returning the heart monitor before our office calls you with the results.   Echocardiogram   Your physician has requested that you have an echocardiogram. Echocardiography is a painless test that uses sound waves to create images of your heart. It provides your doctor with information about the size and shape of your heart and how well your heart's chambers and valves are working. This procedure takes approximately one hour. There are no restrictions for this procedure. Please do NOT wear cologne, perfume, aftershave, or lotions (deodorant is allowed). Please arrive 15 minutes prior to your appointment time.  Please note: We ask at that you not bring children with you during ultrasound (echo/ vascular) testing. Due to room size and safety concerns, children are not allowed in the ultrasound rooms during exams. Our front office staff cannot provide observation of children in our lobby area while testing is being conducted. An adult accompanying a patient to their appointment will only be allowed in the ultrasound room at the discretion of the ultrasound technician under special circumstances. We apologize for any inconvenience.  Follow-Up: At [facility], you and your health needs are our priority.  As part of our continuing mission to provide you with exceptional heart care, our providers are all part of  one team.  This team includes your primary Cardiologist (physician) and Advanced Practice Providers or APPs (Physician Assistants and Nurse Practitioners) who all work together to provide you with the care you need, when you need it.  Your next appointment:   12 month(s)  Provider:   Georganna Archer, MD

## 2024-09-13 NOTE — Progress Notes (Unsigned)
 Applied a 14 day Zio XT monitor to patient in the office ?

## 2024-09-14 ENCOUNTER — Ambulatory Visit: Payer: Self-pay | Admitting: Student in an Organized Health Care Education/Training Program

## 2024-09-14 DIAGNOSIS — E7849 Other hyperlipidemia: Secondary | ICD-10-CM

## 2024-09-14 LAB — LIPID PANEL
Chol/HDL Ratio: 5 ratio — ABNORMAL HIGH (ref 0.0–4.4)
Cholesterol, Total: 236 mg/dL — ABNORMAL HIGH (ref 100–199)
HDL: 47 mg/dL (ref >39)
LDL Chol Calc (NIH): 170 mg/dL — ABNORMAL HIGH (ref 0–99)
Triglycerides: 107 mg/dL (ref 0–149)
VLDL Cholesterol Cal: 19 mg/dL (ref 5–40)

## 2024-09-14 LAB — LIPOPROTEIN A (LPA): Lipoprotein (a): 52.6 nmol/L (ref <75.0)

## 2024-09-15 MED ORDER — ROSUVASTATIN CALCIUM 10 MG PO TABS: 10.0000 mg | ORAL_TABLET | Freq: Every day | ORAL | 3 refills | Status: AC

## 2024-09-15 NOTE — Telephone Encounter (Signed)
 Patient returned RN's call regarding results.

## 2024-09-25 ENCOUNTER — Other Ambulatory Visit: Payer: Self-pay | Admitting: Hematology and Oncology

## 2024-09-26 ENCOUNTER — Encounter: Payer: Self-pay | Admitting: Hematology and Oncology

## 2024-09-26 DIAGNOSIS — Z23 Encounter for immunization: Secondary | ICD-10-CM | POA: Diagnosis not present

## 2024-09-27 ENCOUNTER — Encounter: Payer: Self-pay | Admitting: Family Medicine

## 2024-09-27 ENCOUNTER — Ambulatory Visit: Admitting: Family Medicine

## 2024-09-27 VITALS — BP 113/77 | HR 71 | Temp 98.3°F | Ht 65.0 in | Wt 184.0 lb

## 2024-09-27 DIAGNOSIS — E66811 Obesity, class 1: Secondary | ICD-10-CM | POA: Diagnosis not present

## 2024-09-27 DIAGNOSIS — Z6830 Body mass index (BMI) 30.0-30.9, adult: Secondary | ICD-10-CM

## 2024-09-27 DIAGNOSIS — E88819 Insulin resistance, unspecified: Secondary | ICD-10-CM | POA: Diagnosis not present

## 2024-09-27 MED ORDER — ZEPBOUND 12.5 MG/0.5ML ~~LOC~~ SOLN
12.5000 mg | SUBCUTANEOUS | 1 refills | Status: DC
Start: 2024-09-27 — End: 2024-10-31

## 2024-09-27 NOTE — Progress Notes (Signed)
 Office: [phone]  /  Fax: [phone]  WEIGHT SUMMARY AND BIOMETRICS  Starting Date: 08/26/23  Starting Weight: 241lb   Weight Lost Since Last Visit: 3lb   Vitals Temp: 98.3 F (36.8 C) BP: 113/77 Pulse Rate: 71 SpO2: 99 %   Body Composition  Body Fat %: 38.8 % Fat Mass (lbs): 71.4 lbs Muscle Mass (lbs): 106.8 lbs Total Body Water (lbs): 74.4 lbs Visceral Fat Rating : 9     HPI  Chief Complaint: OBESITY  Caitlin [name] is here to discuss her progress with her obesity treatment plan. She is on the the Category 2 Plan and states she is following her eating plan approximately 60 % of the time. She states she is exercising 30-60 minutes 3-6 times per week.   Interval History:  Since last office visit she is down 3 lb This gives her a net weight loss of 57 lb in the past 13 mos of medically supervised weight management This is a 23.6% TBW loss She has improved satiety from Zepbound  12.5 mg weekly She has been consistent with workouts  She feels good about her weight loss and would like to lose about 20 lb more  Pharmacotherapy: Zepbound  12.5 mg weekly  PHYSICAL EXAM:  Blood pressure 113/77, pulse 71, temperature 98.3 F (36.8 C), height 5' 5 (1.651 m), weight 184 lb (83.5 kg), SpO2 99%. Body mass index is 30.62 kg/m.  General: She is  healthy appearing, cooperative, alert, well developed, and in no acute distress. PSYCH: Has normal mood, affect and thought process.   Lungs: Normal breathing effort, no conversational dyspnea.  ASSESSMENT AND PLAN  TREATMENT PLAN FOR OBESITY:  Recommended Dietary Goals  Marsheila is currently in the action stage of change. As such, her goal is to continue weight management plan. She has agreed to keeping a food journal and adhering to recommended goals of 1500 calories and 100 g of  protein and practicing portion control and making smarter food choices, such as increasing vegetables and decreasing simple  carbohydrates.  Behavioral Intervention  We discussed the following Behavioral Modification Strategies today: increasing lean protein intake to established goals, increasing fiber rich foods, avoiding skipping meals, increasing water intake , work on tracking and journaling calories using tracking application, keeping healthy foods at home, continue to practice mindfulness when eating, and planning for success.  Additional resources provided today: NA  Recommended Physical Activity Goals  Sharisa has been advised to work up to 150 minutes of moderate intensity aerobic activity a week and strengthening exercises 2-3 times per week for cardiovascular health, weight loss maintenance and preservation of muscle mass.   She has agreed to Increase the intensity, frequency or duration of strengthening exercises  and Increase the intensity, frequency or duration of aerobic exercises    Pharmacotherapy changes for the treatment of obesity: none  ASSOCIATED CONDITIONS ADDRESSED TODAY  Insulin  resistance Improved Reviewed lab from last visit Fasting insulin  improved from 20.6 --> 9.4 This has been due to weight loss, dietary change, regular exercise and use of Zepbound  Continue current plan of care  Obesity, Class I, BMI 30-34.9 -     Zepbound ; Inject 12.5 mg into the skin once a week.  Dispense: 2 mL; Refill: 1 She has adequate satiety without meal skipping or GI upset from Zepbound  12.5 mg weekly  BMI 30.0-30.9,adult      She was informed of the importance of frequent follow up visits to maximize her success with intensive lifestyle modifications for her multiple health conditions.  ATTESTASTION STATEMENTS:  Reviewed by clinician on day of visit: allergies, medications, problem list, medical history, surgical history, family history, social history, and previous encounter notes pertinent to obesity diagnosis.      Darice Haddock, D.O. DABFM, DABOM [facility] [address] [place], [place] [address] [phone]

## 2024-09-28 ENCOUNTER — Inpatient Hospital Stay: Attending: Hematology and Oncology

## 2024-09-28 ENCOUNTER — Inpatient Hospital Stay

## 2024-09-28 VITALS — BP 127/83 | HR 85 | Temp 98.4°F | Resp 18

## 2024-09-28 DIAGNOSIS — Z5111 Encounter for antineoplastic chemotherapy: Secondary | ICD-10-CM | POA: Insufficient documentation

## 2024-09-28 DIAGNOSIS — C50412 Malignant neoplasm of upper-outer quadrant of left female breast: Secondary | ICD-10-CM | POA: Diagnosis not present

## 2024-09-28 DIAGNOSIS — F4321 Adjustment disorder with depressed mood: Secondary | ICD-10-CM | POA: Diagnosis not present

## 2024-09-28 MED ORDER — GOSERELIN ACETATE 3.6 MG ~~LOC~~ IMPL
3.6000 mg | DRUG_IMPLANT | SUBCUTANEOUS | Status: DC
  Administered 2024-09-28: 3.6 mg via SUBCUTANEOUS
  Filled 2024-09-28: qty 3.6

## 2024-09-30 DIAGNOSIS — R002 Palpitations: Secondary | ICD-10-CM | POA: Diagnosis not present

## 2024-10-18 DIAGNOSIS — R002 Palpitations: Secondary | ICD-10-CM

## 2024-10-20 ENCOUNTER — Ambulatory Visit (HOSPITAL_COMMUNITY)
Admission: RE | Admit: 2024-10-20 | Discharge: 2024-10-20 | Disposition: A | Discharge status: Home / Self Care | Source: Ambulatory Visit | Attending: Internal Medicine | Admitting: Internal Medicine

## 2024-10-20 DIAGNOSIS — R002 Palpitations: Secondary | ICD-10-CM | POA: Diagnosis not present

## 2024-10-20 LAB — ECHOCARDIOGRAM COMPLETE
Area-P 1/2: 4.53 cm2
S' Lateral: 3.1 cm

## 2024-10-24 ENCOUNTER — Ambulatory Visit: Attending: Hematology and Oncology

## 2024-10-24 VITALS — Wt 187.2 lb

## 2024-10-24 DIAGNOSIS — Z483 Aftercare following surgery for neoplasm: Secondary | ICD-10-CM | POA: Insufficient documentation

## 2024-10-24 NOTE — Therapy (Signed)
 OUTPATIENT PHYSICAL THERAPY SOZO SCREENING NOTE   Patient Name: Caitlin [name] MRN: [id number] DOB:12/16/1971, 53 y.o., female Today's Date: 10/24/2024  PCP: Burney Darice CROME, MD REFERRING PROVIDER: Burney Darice CROME, MD   PT End of Session - 10/24/24 [id number]     Visit Number 3   # unchanged due to screen only   PT Start Time 0836    PT Stop Time 0840    PT Time Calculation (min) 4 min    Activity Tolerance Patient tolerated treatment well    Behavior During Therapy WFL for tasks assessed/performed          Past Medical History:  Diagnosis Date   Anxiety    Back pain    Breast cancer (HCC)    Depression    Edema of both lower extremities    Elevated cholesterol    Endometriosis    Endometriosis    Family history of kidney cancer    Family history of ovarian cancer    Fatty liver    Gallbladder problem    High blood pressure    High cholesterol    History of IBS    IBS (irritable bowel syndrome)    Infertility, female    Joint pain    Personal history of radiation therapy    Vitamin D  deficiency    Past Surgical History:  Procedure Laterality Date   BREAST LUMPECTOMY     BREAST LUMPECTOMY WITH RADIOACTIVE SEED AND SENTINEL LYMPH NODE BIOPSY Left 04/22/2022   Procedure: LEFT BREAST LUMPECTOMY WITH RADIOACTIVE SEED;  Surgeon: Vernetta Berg, MD;  Location: [facility] OR;  Service: General;  Laterality: Left;  LMA   CHOLECYSTECTOMY  2018   LAPAROSCOPY     SENTINEL NODE BIOPSY Left 04/22/2022   Procedure: LEFT SENTINEL LYMPH NODE BIOPSY;  Surgeon: Vernetta Berg, MD;  Location: [facility] OR;  Service: General;  Laterality: Left;   Patient Active Problem List   Diagnosis Date Noted   Sedentary lifestyle 11/19/2023   Heartburn 10/26/2023   Insulin  resistance 09/09/2023   Polyphagia 09/09/2023   Other fatigue 08/26/2023   SOBOE (shortness of breath on exertion) 08/26/2023   History of breast cancer 08/26/2023   Plantar fasciitis 08/26/2023   Depression 08/26/2023    Depression screen 08/26/2023   Pre-diabetes 08/26/2023   Vitamin D  deficiency 08/26/2023   Other hyperlipidemia 08/26/2023   BMI 40.0-44.9, adult (HCC) 08/26/2023   Morbid obesity (HCC) with starting BMI 40 08/26/2023   Elevated blood-pressure reading without diagnosis of hypertension 03/26/2023   Family history of colonic polyps 10/22/2022   Genetic testing 04/29/2022   Family history of ovarian cancer 04/15/2022   Family history of kidney cancer 04/15/2022   Malignant neoplasm of upper-outer quadrant of left breast in female, estrogen receptor positive (HCC) 04/02/2022   Endometriosis 02/20/2022   Vitamin D  insufficiency 02/18/2021   Elevated cholesterol 02/18/2021   Class 1 obesity due to excess calories with body mass index (BMI) of 34.0 to 34.9 in adult 02/07/2021   Irritable bowel syndrome 02/07/2021   Benign intracranial hypertension 09/10/2020    REFERRING DIAG: left breast cancer at risk for lymphedema  THERAPY DIAG: Aftercare following surgery for neoplasm  PERTINENT HISTORY: Patient was diagnosed with left IDC. ER/PR positive, HER2 negative KI67 less than 5%. Pt had left lumpectomy and SLNB on 04/22/22 with Dr. Vernetta. Radiation. No chemotherapy.  Radiation completed 07/09/22. Starting tamoxifen .   PRECAUTIONS: left UE Lymphedema risk, None  SUBJECTIVE: Pt returns for her first 6 month L-Dex screen.  PAIN:  Are you having pain? No  SOZO SCREENING: Patient was assessed today using the SOZO machine to determine the lymphedema index score. This was compared to her baseline score. It was determined that she is within the recommended range when compared to her baseline and no further action is needed at this time. She will continue SOZO screenings. These are done every 3 months for 2 years post operatively followed by every 6 months for 2 years, and then annually.   L-DEX FLOWSHEETS - 10/24/24 0800       L-DEX LYMPHEDEMA SCREENING   Measurement Type Unilateral    L-DEX  MEASUREMENT EXTREMITY Upper Extremity    POSITION  Standing    DOMINANT SIDE Right    At Risk Side Left    BASELINE SCORE (UNILATERAL) -2.6    L-DEX SCORE (UNILATERAL) -2.1    VALUE CHANGE (UNILAT) 0.5         P: Cont 6 month L-Dex screen until 03/2026.   Aden Berwyn Caldron, PTA 10/24/2024, 8:39 AM

## 2024-10-31 ENCOUNTER — Ambulatory Visit (INDEPENDENT_AMBULATORY_CARE_PROVIDER_SITE_OTHER): Admitting: Family Medicine

## 2024-10-31 ENCOUNTER — Encounter: Payer: Self-pay | Admitting: Family Medicine

## 2024-10-31 ENCOUNTER — Inpatient Hospital Stay: Attending: Hematology and Oncology

## 2024-10-31 ENCOUNTER — Inpatient Hospital Stay

## 2024-10-31 VITALS — BP 117/82 | HR 77 | Temp 97.8°F | Ht 65.0 in | Wt 182.0 lb

## 2024-10-31 DIAGNOSIS — Z5111 Encounter for antineoplastic chemotherapy: Secondary | ICD-10-CM | POA: Diagnosis not present

## 2024-10-31 DIAGNOSIS — G932 Benign intracranial hypertension: Secondary | ICD-10-CM

## 2024-10-31 DIAGNOSIS — Z6830 Body mass index (BMI) 30.0-30.9, adult: Secondary | ICD-10-CM

## 2024-10-31 DIAGNOSIS — E88819 Insulin resistance, unspecified: Secondary | ICD-10-CM | POA: Diagnosis not present

## 2024-10-31 DIAGNOSIS — Z170 Estrogen receptor positive status [ER+]: Secondary | ICD-10-CM | POA: Diagnosis not present

## 2024-10-31 DIAGNOSIS — E66811 Obesity, class 1: Secondary | ICD-10-CM | POA: Diagnosis not present

## 2024-10-31 DIAGNOSIS — Z853 Personal history of malignant neoplasm of breast: Secondary | ICD-10-CM

## 2024-10-31 DIAGNOSIS — C50412 Malignant neoplasm of upper-outer quadrant of left female breast: Secondary | ICD-10-CM | POA: Diagnosis not present

## 2024-10-31 MED ORDER — ZEPBOUND 12.5 MG/0.5ML ~~LOC~~ SOLN: 12.5000 mg | SUBCUTANEOUS | 1 refills | Status: DC

## 2024-10-31 MED ORDER — GOSERELIN ACETATE 3.6 MG ~~LOC~~ IMPL
3.6000 mg | DRUG_IMPLANT | Freq: Once | SUBCUTANEOUS | Status: AC
  Administered 2024-10-31: 3.6 mg via SUBCUTANEOUS
  Filled 2024-10-31: qty 3.6

## 2024-10-31 NOTE — Progress Notes (Signed)
 Office: [phone]  /  Fax: [phone]  WEIGHT SUMMARY AND BIOMETRICS  Starting Date: 08/26/23  Starting Weight: 241lb   Weight Lost Since Last Visit: 2lb   Vitals Temp: 97.8 F (36.6 C) BP: 117/82 Pulse Rate: 77 SpO2: 99 %   Body Composition  Body Fat %: 38.7 % Fat Mass (lbs): 70.6 lbs Muscle Mass (lbs): 106 lbs Total Body Water (lbs): 73 lbs Visceral Fat Rating : 9    HPI  Chief Complaint: OBESITY  Caitlin [name] is here to discuss her progress with her obesity treatment plan. She is on the the Category 2 Plan and states she is following her eating plan approximately 50 % of the time. She states she is exercising 30-45 minutes 3-4 times per week.  Interval History:  Since last office visit she is down 2 lb She is down 0.8 lb of muscle mass and down 0.8 lb of body fat since last visit She does feel a bit of wear- off effect day 4-6 post Zepbound  injection She has been around a bit more sweets and indulged some She is doing weight training 3 days/ wk and weight training the other days of the week She is feeling stronger and has better endurance She has a net weight loss of 59 lb in the past 15 mos This is a 24% TBW loss  Pharmacotherapy: Zepbound  12.5 mg weekly (Lilly direct)  PHYSICAL EXAM:  Blood pressure 117/82, pulse 77, temperature 97.8 F (36.6 C), height 5' 5 (1.651 m), weight 182 lb (82.6 kg), SpO2 99%. Body mass index is 30.29 kg/m.  General: She is healthy appearing, cooperative, alert, well developed, and in no acute distress. PSYCH: Has normal mood, affect and thought process.   Lungs: Normal breathing effort, no conversational dyspnea.   ASSESSMENT AND PLAN  TREATMENT PLAN FOR OBESITY:  Recommended Dietary Goals  [name] is currently in the action stage of change. As such, her goal is to continue weight management plan. She has agreed to keeping a food journal and adhering to recommended goals of 1500 calories and 80+ g of  protein and  practicing portion control and making smarter food choices, such as increasing vegetables and decreasing simple carbohydrates.  Behavioral Intervention  We discussed the following Behavioral Modification Strategies today: increasing lean protein intake to established goals, increasing fiber rich foods, increasing water intake , work on meal planning and preparation, work on counselling psychologist calories using tracking application, keeping healthy foods at home, work on managing stress, creating time for self-care and relaxation, avoiding temptations and identifying enticing environmental cues, and continue to practice mindfulness when eating.  Additional resources provided today: NA  Recommended Physical Activity Goals  [name] has been advised to work up to 150 minutes of moderate intensity aerobic activity a week and strengthening exercises 2-3 times per week for cardiovascular health, weight loss maintenance and preservation of muscle mass.   She has agreed to Continue current level of physical activity   Pharmacotherapy changes for the treatment of obesity: none (she will be moving injection day of Zepbound  from Sunday to Wednesday to better cover weekend eating)  ASSOCIATED CONDITIONS ADDRESSED TODAY  Insulin  resistance Improving Fasting insulin  improved from 20.6--> 9.4 Great job with weight loss, dietary changes, regular workouts and Zepbound   Obesity, Class I, BMI 30-34.9 -     Zepbound ; Inject 12.5 mg into the skin once a week.  Dispense: 2 mL; Refill: 1 Will discuss wean off plan once BMI is <30 post holidays  BMI  30.0-30.9,adult  Benign intracranial hypertension Improving with less frequent headaches and less intensity of headaches Great job with weight manage43ment  History of breast cancer Managed by Dr Loretha on Zoladex  3.6 mg injection q 28 days and Femara  2.5 mg daily She is doing great with cardio and resistance training 5+ days/ wk and body fat reduction     She was informed of the importance of frequent follow up visits to maximize her success with intensive lifestyle modifications for her multiple health conditions.   ATTESTASTION STATEMENTS:  Reviewed by clinician on day of visit: allergies, medications, problem list, medical history, surgical history, family history, social history, and previous encounter notes pertinent to obesity diagnosis.   I have personally spent 24 minutes total time today in preparation, patient care, nutritional counseling and education,  and documentation for this visit, including the following: review of most recent clinical lab tests, prescribing medications/ refilling medications, reviewing medical assistant documentation, review and interpretation of bioimpedence results.     Darice Haddock, D.O. DABFM, DABOM [facility] [address] [place], [place] [address] [phone]

## 2024-11-02 DIAGNOSIS — F4321 Adjustment disorder with depressed mood: Secondary | ICD-10-CM | POA: Diagnosis not present

## 2024-11-28 ENCOUNTER — Inpatient Hospital Stay: Attending: Hematology and Oncology

## 2024-11-28 ENCOUNTER — Inpatient Hospital Stay

## 2024-11-28 VITALS — BP 132/87 | HR 75 | Temp 98.2°F | Resp 17

## 2024-11-28 DIAGNOSIS — C50412 Malignant neoplasm of upper-outer quadrant of left female breast: Secondary | ICD-10-CM | POA: Insufficient documentation

## 2024-11-28 DIAGNOSIS — Z5111 Encounter for antineoplastic chemotherapy: Secondary | ICD-10-CM | POA: Insufficient documentation

## 2024-11-28 MED ORDER — GOSERELIN ACETATE 3.6 MG ~~LOC~~ IMPL
3.6000 mg | DRUG_IMPLANT | SUBCUTANEOUS | Status: DC
  Administered 2024-11-28: 3.6 mg via SUBCUTANEOUS
  Filled 2024-11-28: qty 3.6

## 2024-12-01 ENCOUNTER — Other Ambulatory Visit: Payer: Self-pay | Admitting: Family Medicine

## 2024-12-01 DIAGNOSIS — R7301 Impaired fasting glucose: Secondary | ICD-10-CM | POA: Diagnosis not present

## 2024-12-01 DIAGNOSIS — F339 Major depressive disorder, recurrent, unspecified: Secondary | ICD-10-CM | POA: Diagnosis not present

## 2024-12-01 DIAGNOSIS — E559 Vitamin D deficiency, unspecified: Secondary | ICD-10-CM | POA: Diagnosis not present

## 2024-12-01 DIAGNOSIS — Z0001 Encounter for general adult medical examination with abnormal findings: Secondary | ICD-10-CM | POA: Diagnosis not present

## 2024-12-01 DIAGNOSIS — E049 Nontoxic goiter, unspecified: Secondary | ICD-10-CM

## 2024-12-01 DIAGNOSIS — E7800 Pure hypercholesterolemia, unspecified: Secondary | ICD-10-CM | POA: Diagnosis not present

## 2024-12-09 ENCOUNTER — Inpatient Hospital Stay: Admission: RE | Admit: 2024-12-09 | Discharge: 2024-12-09 | Attending: Family Medicine | Admitting: Family Medicine

## 2024-12-09 DIAGNOSIS — E049 Nontoxic goiter, unspecified: Secondary | ICD-10-CM

## 2024-12-09 DIAGNOSIS — E041 Nontoxic single thyroid nodule: Secondary | ICD-10-CM | POA: Diagnosis not present

## 2024-12-13 ENCOUNTER — Ambulatory Visit: Admitting: Family Medicine

## 2024-12-13 ENCOUNTER — Encounter: Payer: Self-pay | Admitting: Family Medicine

## 2024-12-13 VITALS — BP 115/81 | HR 76 | Ht 65.0 in | Wt 175.0 lb

## 2024-12-13 DIAGNOSIS — E663 Overweight: Secondary | ICD-10-CM

## 2024-12-13 DIAGNOSIS — G932 Benign intracranial hypertension: Secondary | ICD-10-CM

## 2024-12-13 DIAGNOSIS — E7849 Other hyperlipidemia: Secondary | ICD-10-CM

## 2024-12-13 DIAGNOSIS — Z6829 Body mass index (BMI) 29.0-29.9, adult: Secondary | ICD-10-CM

## 2024-12-13 DIAGNOSIS — Z853 Personal history of malignant neoplasm of breast: Secondary | ICD-10-CM

## 2024-12-13 MED ORDER — ZEPBOUND 12.5 MG/0.5ML ~~LOC~~ SOLN: 12.5000 mg | SUBCUTANEOUS | 1 refills | Status: AC

## 2024-12-13 NOTE — Progress Notes (Signed)
 Office: [phone]  /  Fax: [phone]  WEIGHT SUMMARY AND BIOMETRICS  Starting Date: 08/26/23  Starting Weight: 241lb   Weight Lost Since Last Visit: 7lb   Vitals BP: 115/81 Pulse Rate: 76 SpO2: 100 %   Body Composition  Body Fat %: 38 % Fat Mass (lbs): 66.6 lbs Muscle Mass (lbs): 103.2 lbs Total Body Water (lbs): 70.6 lbs Visceral Fat Rating : 8   HPI  Chief Complaint: OBESITY  Caitlin [name] is here to discuss her progress with her obesity treatment plan. She is on the the Category 2 Plan and states she is following her eating plan approximately 50 % of the time. She states she is exercising 30-60 minutes 3 times per week.  Interval History:  Since last office visit she is down 7 lb This gives her a net weight loss of 66 lb in 15 mos of medically supervised weight management This is a 27% TBW loss She has not been walking as much but is doing weight training 3 days/ wk Her family has been supportive She is not needing to track calories but is around 1500 cal/ day She has adequate satiety without meal skipping or GI upset from Zepbound  12.5 mg weekly injection She did move her injection day to improve control on weekends and this worked well   Pharmacotherapy: Zepbound  12.5 mg weekly injection  PHYSICAL EXAM:  Blood pressure 115/81, pulse 76, height 5' 5 (1.651 m), weight 175 lb (79.4 kg), SpO2 100%. Body mass index is 29.12 kg/m.  General: She is healthy appearing, cooperative, alert, well developed, and in no acute distress. PSYCH: Has normal mood, affect and thought process.   Lungs: Normal breathing effort, no conversational dyspnea.   ASSESSMENT AND PLAN  TREATMENT PLAN FOR OBESITY:  Recommended Dietary Goals  Caitlin [name] is currently in the action stage of change. As such, her goal is to continue weight management plan. She has agreed to keeping a food journal and adhering to recommended goals of 1500 calories and 90 g of protein.  Behavioral  Intervention  We discussed the following Behavioral Modification Strategies today: increasing lean protein intake to established goals, increasing fiber rich foods, increasing water intake , work on meal planning and preparation, keeping healthy foods at home, avoiding temptations and identifying enticing environmental cues, continue to practice mindfulness when eating, and planning for success.  Additional resources provided today: NA  Recommended Physical Activity Goals  Caitlin [name] has been advised to work up to 150 minutes of moderate intensity aerobic activity a week and strengthening exercises 2-3 times per week for cardiovascular health, weight loss maintenance and preservation of muscle mass.   She has agreed to Increase the intensity, frequency or duration of strengthening exercises  and Increase and monitor steps for a goal of 10,000 per day  Pharmacotherapy changes for the treatment of obesity: none  ASSOCIATED CONDITIONS ADDRESSED TODAY  Benign intracranial hypertension Improving She has seen a big reduction in headache frequency with weight loss.  Keep upcoming visits with Dr. Skeet.  Overweight (BMI 25.0-29.9) She has adequate satiety with weight reduction on Zepbound  12.5 mg once weekly injection without hypoglycemia, GI upset when meal skipping.  Reviewed bioimpedance results with additional muscle loss this past month.  Recommend supplementing with a higher protein snack during the day.  Resume increased walking with a goal of 10,000 steps daily.  Will discuss maintenance phase of weight loss over the next 2 months, likely needing a reduced dose of Zepbound .  Patient was counseled on the  importance of maintaining healthy lifestyle habits, including balanced nutrition, regular physical activity, and behavioral modifications, while taking antiobesity medication.  Patient verbalized understanding that medication is an adjunct to, not a replacement for, lifestyle changes and that  the long-term success and weight maintenance depend on continued adherence to these strategies.  -     Zepbound ; Inject 12.5 mg into the skin once a week.  Dispense: 2 mL; Refill: 1  BMI 29.0-29.9,adult  Other hyperlipidemia -     Zepbound ; Inject 12.5 mg into the skin once a week.  Dispense: 2 mL; Refill: 1 She started rosuvastatin  10 mg once daily by Dr. Floretta.  She is set up for labs soon.  Denies myalgias.  History of breast cancer Followed by Dr. Loretha, she is on Femara  2.5 mg daily and Zoladex  3.6 mg q. 28 days.  She has done great with body fat reduction, reducing risk of future weight related cancers.    She was informed of the importance of frequent follow up visits to maximize her success with intensive lifestyle modifications for her multiple health conditions.   ATTESTASTION STATEMENTS:  Reviewed by clinician on day of visit: allergies, medications, problem list, medical history, surgical history, family history, social history, and previous encounter notes pertinent to obesity diagnosis.   I have personally spent 30 minutes total time today in preparation, patient care, nutritional counseling and education,  and documentation for this visit, including the following: review of most recent clinical lab tests, prescribing medications/ refilling medications, reviewing medical assistant documentation, review and interpretation of bioimpedence results.     Caitlin [name], D.O. DABFM, DABOM [facility] [address] [place], [place] [address] [phone]

## 2024-12-15 ENCOUNTER — Other Ambulatory Visit: Payer: Self-pay | Admitting: Family Medicine

## 2024-12-15 DIAGNOSIS — E041 Nontoxic single thyroid nodule: Secondary | ICD-10-CM

## 2024-12-16 ENCOUNTER — Other Ambulatory Visit: Payer: Self-pay

## 2024-12-16 DIAGNOSIS — E7849 Other hyperlipidemia: Secondary | ICD-10-CM

## 2024-12-16 LAB — LIPID PANEL
Chol/HDL Ratio: 2.9 ratio (ref 0.0–4.4)
Cholesterol, Total: 153 mg/dL (ref 100–199)
HDL: 53 mg/dL
LDL Chol Calc (NIH): 85 mg/dL (ref 0–99)
Triglycerides: 78 mg/dL (ref 0–149)
VLDL Cholesterol Cal: 15 mg/dL (ref 5–40)

## 2024-12-17 ENCOUNTER — Ambulatory Visit: Payer: Self-pay | Admitting: Student in an Organized Health Care Education/Training Program

## 2024-12-30 ENCOUNTER — Inpatient Hospital Stay: Attending: Hematology and Oncology

## 2024-12-30 ENCOUNTER — Inpatient Hospital Stay

## 2024-12-30 VITALS — BP 135/91 | HR 91 | Temp 98.3°F | Resp 16

## 2024-12-30 DIAGNOSIS — C50412 Malignant neoplasm of upper-outer quadrant of left female breast: Secondary | ICD-10-CM | POA: Diagnosis present

## 2024-12-30 DIAGNOSIS — Z170 Estrogen receptor positive status [ER+]: Secondary | ICD-10-CM

## 2024-12-30 DIAGNOSIS — Z5111 Encounter for antineoplastic chemotherapy: Secondary | ICD-10-CM | POA: Insufficient documentation

## 2024-12-30 MED ORDER — GOSERELIN ACETATE 3.6 MG ~~LOC~~ IMPL
3.6000 mg | DRUG_IMPLANT | SUBCUTANEOUS | Status: DC
  Administered 2024-12-30: 3.6 mg via SUBCUTANEOUS
  Filled 2024-12-30: qty 3.6

## 2025-01-09 ENCOUNTER — Inpatient Hospital Stay
Admission: RE | Admit: 2025-01-09 | Discharge: 2025-01-09 | Disposition: A | Source: Ambulatory Visit | Attending: Family Medicine | Admitting: Family Medicine

## 2025-01-09 ENCOUNTER — Other Ambulatory Visit (HOSPITAL_COMMUNITY)
Admission: RE | Admit: 2025-01-09 | Discharge: 2025-01-09 | Disposition: A | Discharge status: Home / Self Care | Source: Ambulatory Visit

## 2025-01-09 DIAGNOSIS — C50412 Malignant neoplasm of upper-outer quadrant of left female breast: Secondary | ICD-10-CM | POA: Diagnosis not present

## 2025-01-09 DIAGNOSIS — E041 Nontoxic single thyroid nodule: Secondary | ICD-10-CM | POA: Diagnosis present

## 2025-01-11 LAB — CYTOLOGY - NON PAP

## 2025-01-17 ENCOUNTER — Other Ambulatory Visit: Payer: Self-pay | Admitting: Family Medicine

## 2025-01-17 DIAGNOSIS — E7849 Other hyperlipidemia: Secondary | ICD-10-CM

## 2025-01-17 DIAGNOSIS — E663 Overweight: Secondary | ICD-10-CM

## 2025-01-24 ENCOUNTER — Ambulatory Visit: Admitting: Family Medicine

## 2025-01-30 ENCOUNTER — Inpatient Hospital Stay

## 2025-01-31 ENCOUNTER — Inpatient Hospital Stay: Attending: Hematology and Oncology

## 2025-01-31 DIAGNOSIS — C50412 Malignant neoplasm of upper-outer quadrant of left female breast: Secondary | ICD-10-CM

## 2025-01-31 MED ORDER — GOSERELIN ACETATE 3.6 MG ~~LOC~~ IMPL
3.6000 mg | DRUG_IMPLANT | SUBCUTANEOUS | Status: DC
  Administered 2025-01-31: 3.6 mg via SUBCUTANEOUS
  Filled 2025-01-31: qty 3.6

## 2025-02-08 ENCOUNTER — Ambulatory Visit: Admitting: Family Medicine

## 2025-02-27 ENCOUNTER — Inpatient Hospital Stay: Attending: Hematology and Oncology

## 2025-02-27 ENCOUNTER — Inpatient Hospital Stay

## 2025-03-15 ENCOUNTER — Encounter

## 2025-03-29 ENCOUNTER — Inpatient Hospital Stay: Attending: Hematology and Oncology

## 2025-03-29 ENCOUNTER — Inpatient Hospital Stay

## 2025-04-24 ENCOUNTER — Ambulatory Visit: Attending: Hematology and Oncology

## 2025-04-28 ENCOUNTER — Inpatient Hospital Stay: Attending: Hematology and Oncology

## 2025-04-28 ENCOUNTER — Inpatient Hospital Stay

## 2025-05-29 ENCOUNTER — Inpatient Hospital Stay

## 2025-05-29 ENCOUNTER — Inpatient Hospital Stay: Attending: Hematology and Oncology

## 2025-06-28 ENCOUNTER — Inpatient Hospital Stay

## 2025-06-28 ENCOUNTER — Inpatient Hospital Stay: Attending: Hematology and Oncology | Admitting: Hematology and Oncology
